# Patient Record
Sex: Female | Born: 1935
Health system: Southern US, Community
[De-identification: ages and names within clinical notes are randomized; demographics above are authoritative.]

## PROBLEM LIST (undated history)

## (undated) DIAGNOSIS — E559 Vitamin D deficiency, unspecified: Secondary | ICD-10-CM

## (undated) DIAGNOSIS — K589 Irritable bowel syndrome without diarrhea: Secondary | ICD-10-CM

## (undated) DIAGNOSIS — I4891 Unspecified atrial fibrillation: Secondary | ICD-10-CM

## (undated) DIAGNOSIS — J9611 Chronic respiratory failure with hypoxia: Secondary | ICD-10-CM

## (undated) DIAGNOSIS — H442B9 Degenerative myopia with macular hole, unspecified eye: Secondary | ICD-10-CM

## (undated) DIAGNOSIS — K635 Polyp of colon: Secondary | ICD-10-CM

## (undated) DIAGNOSIS — N189 Chronic kidney disease, unspecified: Secondary | ICD-10-CM

## (undated) DIAGNOSIS — E039 Hypothyroidism, unspecified: Secondary | ICD-10-CM

## (undated) DIAGNOSIS — J449 Chronic obstructive pulmonary disease, unspecified: Secondary | ICD-10-CM

## (undated) DIAGNOSIS — I214 Non-ST elevation (NSTEMI) myocardial infarction: Secondary | ICD-10-CM

## (undated) DIAGNOSIS — M810 Age-related osteoporosis without current pathological fracture: Secondary | ICD-10-CM

## (undated) DIAGNOSIS — I639 Cerebral infarction, unspecified: Secondary | ICD-10-CM

## (undated) DIAGNOSIS — F419 Anxiety disorder, unspecified: Secondary | ICD-10-CM

## (undated) DIAGNOSIS — I739 Peripheral vascular disease, unspecified: Secondary | ICD-10-CM

## (undated) DIAGNOSIS — M3500 Sicca syndrome, unspecified: Secondary | ICD-10-CM

## (undated) DIAGNOSIS — Z952 Presence of prosthetic heart valve: Secondary | ICD-10-CM

## (undated) DIAGNOSIS — I6521 Occlusion and stenosis of right carotid artery: Secondary | ICD-10-CM

## (undated) DIAGNOSIS — I2510 Atherosclerotic heart disease of native coronary artery without angina pectoris: Secondary | ICD-10-CM

## (undated) DIAGNOSIS — K469 Unspecified abdominal hernia without obstruction or gangrene: Secondary | ICD-10-CM

## (undated) DIAGNOSIS — E785 Hyperlipidemia, unspecified: Secondary | ICD-10-CM

## (undated) DIAGNOSIS — E119 Type 2 diabetes mellitus without complications: Secondary | ICD-10-CM

## (undated) HISTORY — DX: Sjogren syndrome, unspecified: M35.00

## (undated) HISTORY — DX: Anxiety disorder, unspecified: F41.9

## (undated) HISTORY — DX: Hyperlipidemia, unspecified: E78.5

## (undated) HISTORY — DX: Polyp of colon: K63.5

## (undated) HISTORY — DX: Atherosclerotic heart disease of native coronary artery without angina pectoris: I25.10

## (undated) HISTORY — PX: CAROTID ENDARTERECTOMY: SUR193

## (undated) HISTORY — DX: Hypothyroidism, unspecified: E03.9

## (undated) HISTORY — DX: Vitamin D deficiency, unspecified: E55.9

## (undated) HISTORY — DX: Peripheral vascular disease, unspecified: I73.9

## (undated) HISTORY — DX: Cerebral infarction, unspecified: I63.9

## (undated) HISTORY — DX: Occlusion and stenosis of right carotid artery: I65.21

## (undated) HISTORY — DX: Presence of prosthetic heart valve: Z95.2

## (undated) HISTORY — DX: Unspecified abdominal hernia without obstruction or gangrene: K46.9

## (undated) HISTORY — PX: ABDOMINAL HERNIA REPAIR: SHX539

## (undated) HISTORY — PX: AORTIC VALVE REPLACEMENT: SHX41

## (undated) HISTORY — DX: Age-related osteoporosis without current pathological fracture: M81.0

## (undated) HISTORY — DX: Type 2 diabetes mellitus without complications: E11.9

## (undated) HISTORY — DX: Non-ST elevation (NSTEMI) myocardial infarction: I21.4

## (undated) HISTORY — DX: Unspecified atrial fibrillation: I48.91

## (undated) HISTORY — PX: CARDIAC CATHETERIZATION: SHX172

## (undated) HISTORY — DX: Degenerative myopia with macular hole, unspecified eye: H44.2B9

## (undated) HISTORY — DX: Irritable bowel syndrome, unspecified: K58.9

## (undated) HISTORY — DX: Chronic obstructive pulmonary disease, unspecified: J44.9

## (undated) HISTORY — PX: COLONOSCOPY: SHX174

## (undated) HISTORY — DX: Chronic kidney disease, unspecified: N18.9

## (undated) HISTORY — DX: Chronic respiratory failure with hypoxia: J96.11

---

## 2011-03-06 HISTORY — PX: MASTECTOMY: SHX3

## 2012-05-13 ENCOUNTER — Inpatient Hospital Stay
Admission: RE | Admit: 2012-05-13 | Discharge: 2012-06-09 | Disposition: A | Discharge status: Home / Self Care | Payer: Medicare Other | Source: Ambulatory Visit | Attending: Internal Medicine | Admitting: Internal Medicine

## 2012-05-13 DIAGNOSIS — J449 Chronic obstructive pulmonary disease, unspecified: Principal | ICD-10-CM

## 2012-05-13 DIAGNOSIS — R0602 Shortness of breath: Secondary | ICD-10-CM

## 2012-05-21 ENCOUNTER — Ambulatory Visit (HOSPITAL_COMMUNITY)
Admit: 2012-05-21 | Discharge: 2012-05-21 | Disposition: A | Discharge status: Home / Self Care | Payer: Medicare Other | Attending: Internal Medicine | Admitting: Internal Medicine

## 2012-05-21 DIAGNOSIS — J90 Pleural effusion, not elsewhere classified: Secondary | ICD-10-CM | POA: Insufficient documentation

## 2012-05-21 DIAGNOSIS — J4489 Other specified chronic obstructive pulmonary disease: Secondary | ICD-10-CM | POA: Insufficient documentation

## 2012-05-21 DIAGNOSIS — J449 Chronic obstructive pulmonary disease, unspecified: Secondary | ICD-10-CM | POA: Insufficient documentation

## 2012-05-21 DIAGNOSIS — R0602 Shortness of breath: Secondary | ICD-10-CM | POA: Insufficient documentation

## 2012-05-23 ENCOUNTER — Ambulatory Visit (HOSPITAL_COMMUNITY)
Admit: 2012-05-23 | Discharge: 2012-05-23 | Disposition: A | Discharge status: Skilled Nursing Facility | Payer: Medicare Other | Source: Skilled Nursing Facility | Attending: Internal Medicine | Admitting: Internal Medicine

## 2012-05-23 DIAGNOSIS — J90 Pleural effusion, not elsewhere classified: Secondary | ICD-10-CM | POA: Insufficient documentation

## 2012-05-23 DIAGNOSIS — R0602 Shortness of breath: Secondary | ICD-10-CM | POA: Insufficient documentation

## 2012-05-23 DIAGNOSIS — Z9889 Other specified postprocedural states: Secondary | ICD-10-CM | POA: Insufficient documentation

## 2012-05-23 MED ORDER — IOHEXOL 350 MG/ML SOLN
100.0000 mL | Freq: Once | INTRAVENOUS | Status: AC | PRN
  Administered 2012-05-23: 100 mL via INTRAVENOUS

## 2012-05-24 ENCOUNTER — Non-Acute Institutional Stay (SKILLED_NURSING_FACILITY): Payer: Medicare Other | Admitting: Internal Medicine

## 2012-05-24 DIAGNOSIS — J441 Chronic obstructive pulmonary disease with (acute) exacerbation: Secondary | ICD-10-CM

## 2012-05-24 DIAGNOSIS — I4891 Unspecified atrial fibrillation: Secondary | ICD-10-CM

## 2012-05-24 DIAGNOSIS — M25512 Pain in left shoulder: Secondary | ICD-10-CM

## 2012-05-24 DIAGNOSIS — I97190 Other postprocedural cardiac functional disturbances following cardiac surgery: Secondary | ICD-10-CM

## 2012-05-24 DIAGNOSIS — I97130 Postprocedural heart failure following cardiac surgery: Secondary | ICD-10-CM

## 2012-05-24 DIAGNOSIS — I359 Nonrheumatic aortic valve disorder, unspecified: Secondary | ICD-10-CM

## 2012-05-24 DIAGNOSIS — I25810 Atherosclerosis of coronary artery bypass graft(s) without angina pectoris: Secondary | ICD-10-CM

## 2012-05-24 DIAGNOSIS — M25519 Pain in unspecified shoulder: Secondary | ICD-10-CM

## 2012-05-24 NOTE — Progress Notes (Signed)
Subjective:     Patient ID: Julie Burns, female   DOB: Nov 28, 1930, 79 y.o.   MRN: [id number]  HPI Patient was seen today in followup from my admission H&P of three days ago. This is a 79 year old woman who has a history of coronary artery disease s/p stenting. On this occasion  she was admitted fairly urgently for aortic valve replacement which was done at [facility]. There were issues with regards to tapering her oxygen in acute care. When I reviewed her three days ago she was hypoxic with minimal exertion. After extensive discussion with the patient and her daughter I am left with the likely probability that the patient has significant underlying COPD which I don't think has been previously diagnosed. She had seen a pulmonologist preop, however the severity of her aortic bowel disease and necessitated emergency open repair. Her anatomy was not amenable to a TAVR.  Other issues include A. fib on Coumadin. If not been able to get her adequately anticoagulated with Coumadin therefore she continues on Lovenox. I suspect this was a drug interaction with ciprofloxacin which  caused reduction of the INR after ciprofloxacin was stopped. The major concern however was her extreme hypoxemia. When I evaluated her three days ago I wasn't sure what how much of this was CHF, versus COPD, versus the underlying possibility of interstitial lung disease secondary to a history of both organic and inorganic dust exposure in the past. A CT scan of the chest showed the evidence of pulmonary hypertension, hazziness of the lung parenchyma suggested CHF. No overt interstitial lung changes.  There was a small right-sided pleural effusion or there was a small ascending thoracic aortic aneurysm at 3.6. I have increased her Lasix, put her on regular duo Neb nebulizer. Fortunately she seems to feel a lot better and her hypoxemia has stabilized.  Review of Systems Respiratory: Patient does not complaining of cough,  pleuritic chest pain, or really of exertional shortness of breath Cardiac: The patient denies palpitations or shortness of breath. There still may be mild orthopnea Abdominal; she is not complaining of nausea vomiting abdominal pain or diarrhea. Extremity: There is no claudication     Objective:   Physical Exam Pulse is 60 with occasional PVCs, respiratory rate 18 and not in distress, pulse oximeter 95% on room air blood pressure 177/60 weight has been stable at 211 pounds. Respiratory; shallow but otherwise without any distress. No wheezing. Her work of breathing appears normal, no accessory muscle use. Possibly a few crackles in the right lower lobe Cardiac; sounds are normal. Soft midsystolic ejection murmur sounds benign. No S4 no S3, JVP is not elevated. GI; no tenderness no masses Extremities; no edema, no signs of a DVT     Assessment:   #1) CHF; this is my her BNP was 1240. Her Lasix has been increased to 60 mg a day from 40 mg a day. I will  increase this to 80. #2) COPD acute; this is responded clinically to routine duo nebs. I suspect this woman actually has significant COPD which according to discussion with her and family was probably under recognized prior to this acute event. When she is stabilized I will change her to a combined steroid, laba. She will eventually need PFTs. However that is not necessary now. #3] A. Fib. Rate is controlled. She is on top her low XL 50 mg daily. She is also on amiodarone 200 mg a day. A little I had some concern about amiodarone induced lung  toxicity I doubt this is the issue now. #4] coagulation issues. The patient will continue on Lovenox and Coumadin until the INR can reach a therapeutic range. We'll check a CBC in two days time. #5) Hypertension: THis is not under control .Increase lisinopril    Plan:

## 2012-05-27 ENCOUNTER — Non-Acute Institutional Stay (SKILLED_NURSING_FACILITY): Payer: Medicare Other | Admitting: Internal Medicine

## 2012-05-27 ENCOUNTER — Encounter: Payer: Self-pay | Admitting: Internal Medicine

## 2012-05-27 DIAGNOSIS — J441 Chronic obstructive pulmonary disease with (acute) exacerbation: Secondary | ICD-10-CM | POA: Insufficient documentation

## 2012-05-27 DIAGNOSIS — I25810 Atherosclerosis of coronary artery bypass graft(s) without angina pectoris: Secondary | ICD-10-CM | POA: Insufficient documentation

## 2012-05-27 DIAGNOSIS — M25512 Pain in left shoulder: Secondary | ICD-10-CM

## 2012-05-27 DIAGNOSIS — M25519 Pain in unspecified shoulder: Secondary | ICD-10-CM

## 2012-05-27 DIAGNOSIS — I359 Nonrheumatic aortic valve disorder, unspecified: Secondary | ICD-10-CM | POA: Insufficient documentation

## 2012-05-27 DIAGNOSIS — I4891 Unspecified atrial fibrillation: Secondary | ICD-10-CM

## 2012-05-27 NOTE — Progress Notes (Signed)
Note entered in error

## 2012-05-28 ENCOUNTER — Non-Acute Institutional Stay (SKILLED_NURSING_FACILITY): Payer: Medicare Other | Admitting: Internal Medicine

## 2012-05-28 ENCOUNTER — Ambulatory Visit (HOSPITAL_COMMUNITY)
Admit: 2012-05-28 | Discharge: 2012-05-28 | Disposition: A | Discharge status: Home / Self Care | Payer: Medicare Other | Attending: Internal Medicine | Admitting: Internal Medicine

## 2012-05-28 DIAGNOSIS — I4891 Unspecified atrial fibrillation: Secondary | ICD-10-CM

## 2012-05-28 DIAGNOSIS — J441 Chronic obstructive pulmonary disease with (acute) exacerbation: Secondary | ICD-10-CM

## 2012-05-28 DIAGNOSIS — R110 Nausea: Secondary | ICD-10-CM

## 2012-05-28 DIAGNOSIS — I509 Heart failure, unspecified: Secondary | ICD-10-CM

## 2012-05-28 LAB — BLOOD GAS, ARTERIAL
Bicarbonate: 30.3 mEq/L — ABNORMAL HIGH (ref 20.0–24.0)
O2 Content: 4 L/min
Patient temperature: 37
pH, Arterial: 7.454 — ABNORMAL HIGH (ref 7.350–7.450)
pO2, Arterial: 58.8 mmHg — ABNORMAL LOW (ref 80.0–100.0)

## 2012-05-28 NOTE — Progress Notes (Signed)
Patient ID: Julie Burns, female   DOB: February 12, 1931, 79 y.o.   MRN: [id number] Chief complaint; review of medical issues including congestive heart failure, hypoxemia, likely chronic lung disease  History [name] is a lady came to [place] after an aortic valve replacement.  Her postoperative course was complicated by atrial fibrillation necessitating the use of amiodarone.  By the time of her discharge summary { a surgery done at [facility]) there were problems weaning her oxygen.  When I finally got to do her history and physical on 05/24/2012 she was hypoxic even with minimal exertion.  With use of routine nebulizers and a higher flow of oxygen she seems to come a lot better.  Nevertheless she continues to have exertional hypoxemia which I think is limiting her physical therapy.  Suspect by reviewing her history that she had underlying chronic lung disease even before this issue although I don't think COPD was part of her premorbid diagnoses.  It increased her Lasix to 80 mg and she appears to be tolerating this well, had the CHF radiographically.  As well her BNP was over 1200.  As mentioned she seems to be doing very well with the routine nebulizers.  Review of systems; Respiratory; she really does not complain of shortness of breath all, no wheezing Cardiac no exertional chest pain no suggesting of postoperative angina GI; she complains of nausea without vomiting.  She attributes this to her pills in the morning.  Medications; low vaso-1 g 2 tablets by mouth twice a day, Prilosec 20 by mouth daily Klor-Con 20 mEq by mouth daily, amiodarone 200 mg by mouth daily, Provera 92 puffs 4 times a day, ASA 81 daily, calcium carbonate 600 by mouth twice a day, vitamin B complex one by mouth daily, vitamin D 3 1000 units by mouth daily, clonidine 0.2 by mouth twice a day, Tylenol XL 50 by mouth daily, Coumadin 8 mg by mouth daily, [INR tomorrow], Lasix 80 mg by mouth daily, lisinopril 10 mg by mouth daily, DuoNeb  nebs every 4 while awake  Examination; 97.8, p-59, 20,127/78 Respiratory; she generally has decreased air entry bilaterally however this is considerably better than on her arrival here.  There was no audible wheeze.  I walked her roughly 50 yards today.  Her pulse ox drops down to the 83-84% range rebound somewhat to 86-88%.  I suspect that her 50 yards is going to be her exercise tolerance.  [Vo2 MAx) at no point did she complain of exertional chest pain. Cardiac heart sounds are regular there is no evidence of heart failure.   GI no liver no spleen no tenderness GU no suprapubic or costovertebral angle tenderness  Impression/plan 1 COPD and some point I'm going to do a blood gas on her.  Really I think she needs pulmonary function tests however I don't feel pressed to do this at the moment, this may wait till after she is discharged.  I will change her to a steroid inhaler plus all capitals LABA #2 CHF this is stable I see no evidence of this.  She is stable completely from this regard and I see no reason to adjust her Lasix. #3 atrial fibrillation I am not sure the moment whether she is in atrial fibrillation or sinus rhythm her pulse is in the 60's to high 50's #4 nausea question medication induced; there are many possible culprits let alone the volume of medications.  I am going to reduce her amiodarone 200 mg a day as I  seen amiodarone give a failure to thrive syndrome in older people many times.  However I'm also going to try to simplify her overall medical regimen.   I suspect this patient really is going to have a 50 yard exercise tolerance.  I have little doubt that she will require long-term oxygen, was already on this but only at night.  She will require PFTs but I don't feel pressed to do this current.  Terms of her underlying aortic valve replacement I think she has done amazingly well .

## 2012-05-28 NOTE — Progress Notes (Signed)
Patient ID: Julie Burns, female   DOB: 08-07-1930, 79 y.o.   MRN: [id number] Roena Malady, PA-C at 05/27/2012  7:31 PM                         Chief complaint-acute visit followup coronary artery disease-with history of A. fib-and aortic valve replacement-occasional hypoxia.  History of results.  Patient is a pleasant elderly resident with the above diagnoses.  She continues to be quite fragile-her O2 saturations at one point they were in the high 80s however they have now come up to the low 90s.  She actually says her breathing is better-she appears to be more comfortable  than when I saw her on admission--she has been started on routine nebulizers and these appear to help-- does have a listed history of COPD  She is sitting on the side of the bed does not appear to be in any distress.  Her Lasix was increased yesterday to 80 mg a day-it appears she's lost about 3 pounds the past 24 hours.  Her vital signs remained stable  .  Review of systems.  General denies any fever or chills.  Respiratory-denies any increase shortness of breath from baseline says she is actually doing a bit better recently-did not complain of cough.  Cardiac-- history of coronary artery disease but denies any chest pain or palpitations.  Muscle skeletal-does complain at times of some left shoulder pain-denies any recent trauma or fall.  Neurologic does not complaining of any headache or dizziness  Physical exam.  In general this is a frail elderly female who does not appear to be in any distress sitting comfortably on the side of her bed  Skin is warm and dry.  Chest is clear to auscultation with shallow air entry no labored breathing rhonchi rales or wheezes.  Heart is regular rate and rhythm with occasional irregular beats that she has minimal lower extremity edema.  Muscle skeletal-moves all extremities at baseline-I did not note any deformity of the left shoulder area-she does have some pain  anterior to the shoulder when she lifts her arm--radial pulses intact.  Neurologic-grossly intact speech is clear no lateralizing findings.  Psych she is pleasant alert and oriented x3.   Labs.  05/26/2012.  WBC 9.7 hemoglobin 12.2 platelets 328.  Sodium 137 potassium 4.7 BUN 11 creatinine 0.7.  INR 2.76.  Assessment plan.  #1-hypoxia---hx copd--cad-this appears to be asymptomatic  in the high 80s and low 90s-currently 93%-she continues on chronic oxygen-and has nebulizer treatments every 4 hours---this appears to be relatively stable and baseline for her--continue to monitor closely she is a fragile individual--says her breathing actually is a bit better today.  #2-history coronary artery disease-at this point appears relatively stable-denies any chest pain.--She is on Lasix which was recently increased also on Toprol-and ACE inhibitor has been started as well-she does continue on aspirin also  #3-left shoulder pain-she has been working with therapy I suspect this is more of muscle soreness from exercise--if this persists or worsens certainly will consider more aggressive workup including an x-ray--she is receiving Ultram when necessary for pain   #4-atrial Fibrillation-this appears rate controlled-she is on Toprol as well as Coumadin-Lovenox has been DC'd since INR is now therapeutic as of yesterday e also continues  amiodarone

## 2012-05-28 NOTE — Progress Notes (Addendum)
Patient ID: Julie Burns, female   DOB: 08-Mar-1930, 79 y.o.   MRN: [id number]          HISTORY & PHYSICAL  DATE:  05/21/2012  FACILITY: [facility]  LEVEL OF CARE: SNF   CHIEF COMPLAINT:  Status post admission to [facility], 04/30/2012 through 05/13/2012.  Status post aortic valve replacement.    HISTORY OF PRESENT ILLNESS:  This is an 79 year-old woman who lives in [place].  She moved there within the last year.  Previously was at [facility], which I think is a [facility]-oriented medical community.  Her pulmonologist and cardiologist were there.    She had a history of of severe aortic stenosis with an EF of 60%.  She had severe pulmonary hypertension.  Her valve area was 0.9 sq cm.  Cardiac catheterization had demonstrated a LAD stent, moderate right coronary artery disease.  The patient had been having exertional shortness of breath, limiting her activity.   She had a tissue aortic valve replacement.    Issues postoperatively included continued hypoxemia.    She also developed atrial fibrillation and was started on amiodarone.  She was placed on Coumadin 2.5 mg.  However, at the time she was on ciprofloxacin.  Therefore, after the Cipro finished, her INR has become non-therapeutic.    She was discharged on 3 L of oxygen, however with a note that she was on 2 L of oxygen continuously and this is incorrect.  She will use this at night only.  PAST MEDICAL HISTORY :   Aortic valve stenosis, now status post tissue replacement.     Atrial fibrillation.  This is new.  She is on amiodarone which is also new.    Pulmonary hypertension, which is listed as severe.    COPD.  Previously on Combivent, now on Pro-Air.    Coronary artery disease.    Carotid stenosis.   Irritable bowel syndrome.   History of a non-Q wave MI.    Macular degeneration.    Esophageal reflux.   Hypertension.   Hyperlipidemia.   Sjogren's syndrome.    History of breast cancer.    Recurrent umbilical  hernia, status post surgery.    PAST SURGICAL HISTORY:  Left carotid endarterectomy in 2000.   Circumflex coronary artery stent.    Echocardiogram in 2005 and repeated in 2007 showing a normal ejection fraction.   SOCIAL HISTORY: HOUSING:  The patient lives in [place] in an apartment.  I am not sure if this is a seniors' apartment.  FUNCTIONAL STATUS:  She did not use an ambulatory assist device.  She was not on Coumadin.  Oxygen at night, which sounds as though she used on a very p.r.n. basis.  I am not exactly sure of her functional status completely, although her daughter said she was independent enough to go walking around stores.  She did have exertional shortness of breath.  Just how bad this is, I am uncertain.    FAMILY HISTORY:  SIBLINGS:  Widespread heart disease in siblings including a brother and sister.  Not exactly sure about the nature.  MOTHER:  Heart disease.  FATHER:  Heart disease.  CHILDREN:  She has a son with COPD.   CURRENT MEDICATIONS:  Medication list is reviewed.    Prilosec 20 PO q.d.   Lovaza 1 g, 2 PO b.i.d.  KCl 20 q.d.   Amiodarone 200 q.d.   Cipro 200 a day for seven days, which should have finished.  Lisinopril.   Coumadin, dose escalating, currently at 7 mg.   Pro-Air 9 mcg, 2 puffs q.i.d.  ASA 81 q.d.   Vitamin D3 1000 q.d.   Clonidine 0.25, 2 mg PO b.i.d.   Lasix 40 mg daily.  Toprol XL 50 q.d.   REVIEW OF SYSTEMS:   CHEST/RESPIRATORY:  She has exertional shortness of breath, some wheezing, but no cough.  Her daughter states that she was walking up and down the hallway at [facility], and she walked her here two days ago up and down the hallway with her oxygen on at 2 L.   CARDIAC:  She does not describe exertional chest pain.  Does have incisional chest pain.   GI:  Has an umbilical hernia but no nausea,  vomiting or abdominal pain.   PHYSICAL EXAMINATION: VITAL SIGNS:        PULSE:  60s-70s, sounds regular.          O2  SATURATIONS:  Pulse ox variable.  Initially 94% on 2 L.  With minimal activity, it drops down to 83%.  More worrisome than this, she really looks as though she is exhausted.        .     CHEST/RESPIRATORY: Air entry is reduced at the bases, almost negligible in the upper lobes.  There was no wheezing heard.   She is not clubbed.   CARDIOVASCULAR:  Heart sounds are distant.  There are no murmurs.  Her JVP is not elevated.  There is no accentuation of her second sound.  No S3 or S4 are appreciated.   CARDIAC:   GASTROINTESTINAL: ABDOMEN:  Somewhat distended.    HERNIA:  Umbilical hernia.  This is nontender, does not reduce.   CIRCULATION: EDEMA/VARICOSITIES:  Minimal edema.  No evidence of a DVT. ARTERIAL:  Peripheral pulses are palpable.  NEUROLOGICAL:   BALANCE/GAIT:  I got her in a standing position.  When she walked a short distance, her O2 sat dropped to 84%.  She really looked fatigued.  I am not exactly sure what is most operative here.    ASSESSMENT/PLAN:  Status post aortic valve replacement (            ).  She had oxygen issues at the hospital, as well.  I wonder if they were walking her but not measuring her O2 sats.  Certainly what I saw in the room today, going maybe five yards, did not look like somebody who was capable of walking up and down the hall.    Atrial fibrillation 427.31.   This seems well controlled.  Rate is controlled.  Pulsing normal sinus rhythm.   Question congestive heart failure 428.0.  I see no evidence of this at the bedside.  Nevertheless, I am going to do a chest x-ray, EKG, BNP.   Hypoxemia (            ).  Especially with exertion.  I am uncertain whether this is cardiac or she has severe underlying lung disease that was underestimated prior to her surgery.  She saw a pulmonologist in [place] [place], a Dr. Cheree Ditto, but he did not have a chance to do pulmonary function tests although I believe he wanted to.  I will rule out heart failure, she does not appear to have  COPD acute.  I would wonder about underlying chronic lung disease, interstitial lung disease as she has a history of both inorganic and organic dust exposure.  All of this could have been seriously under-recognized  prior to her emergent surgery.    Anxiety, apparently with depression (            ).   Minimal edema (            ).  Her weight has not really fluctuated all that much.  Nevertheless, I will increase her lasix slightly.    I spoke to a very concerned daughter.  Right now, I do not have a sense of this patient's underlying pulmonary status.   I know she has severe pulmonary hypertension, presumably due to valvular heart disease.  Whether there is something else operative here, I am uncertain.  She will need an increase in her oxygen.    CPT CODE: 86578.

## 2012-06-02 ENCOUNTER — Other Ambulatory Visit (HOSPITAL_COMMUNITY): Payer: Self-pay

## 2012-06-02 ENCOUNTER — Non-Acute Institutional Stay (SKILLED_NURSING_FACILITY): Payer: Medicare Other | Admitting: Internal Medicine

## 2012-06-02 DIAGNOSIS — J441 Chronic obstructive pulmonary disease with (acute) exacerbation: Secondary | ICD-10-CM

## 2012-06-02 DIAGNOSIS — I97190 Other postprocedural cardiac functional disturbances following cardiac surgery: Secondary | ICD-10-CM

## 2012-06-02 DIAGNOSIS — I97130 Postprocedural heart failure following cardiac surgery: Secondary | ICD-10-CM

## 2012-06-04 ENCOUNTER — Ambulatory Visit (HOSPITAL_COMMUNITY): Admit: 2012-06-04 | Payer: Medicare Other

## 2012-06-04 ENCOUNTER — Non-Acute Institutional Stay (SKILLED_NURSING_FACILITY): Payer: Medicare Other | Admitting: Internal Medicine

## 2012-06-04 DIAGNOSIS — I97130 Postprocedural heart failure following cardiac surgery: Secondary | ICD-10-CM

## 2012-06-04 DIAGNOSIS — I97190 Other postprocedural cardiac functional disturbances following cardiac surgery: Secondary | ICD-10-CM

## 2012-06-04 DIAGNOSIS — J441 Chronic obstructive pulmonary disease with (acute) exacerbation: Secondary | ICD-10-CM

## 2012-06-05 NOTE — Progress Notes (Shared)
Patient ID: Julie Burns, female   DOB: October 02, 1930, 79 y.o.   MRN: [id number]       PROGRESS NOTE  DATE:   05/26/2012  FACILITY:  [facility]   LEVEL OF CARE: SNF  Acute Visit  CHIEF COMPLAINT:   Follow up multiple medical issues including hypoxemia.    HISTORY OF PRESENT ILLNESS:  Julie Burns is an 79 year-old lady who came to [place] after an aortic valve replacement.  We have had her on Lovenox to Coumadin after her PT/INR plummeted after her acute care discharge, likely secondary to discontinuing ciprofloxacin.    Issues have included extreme hypoxemia with exertion.  I have increased her oxygen to 4 L and she seems to maintain her saturation from this.  The reason for the continued hypoxemia is likely to be severe underlying COPD which may have been under-recognized as an outpatient.    She had a CT scan of the chest that did not show significant interstitial lung disease or pulmonary embolism.  My plan would be to continue her on oxygen.  I suspect at some point she is going to need an arterial blood gas as well as pulmonary function tests (at least spirometry and flow-volume loops).  However, I have been delaying this for now.    REVIEW OF SYSTEMS: CHEST/RESPIRATORY:  She is not complaining of excessive shortness of breath.   CARDIAC:   Chest pain, but I think this is incisional chest pain.   GI:  She complains of nausea without diarrhea.   GU:  No dysuria.    PHYSICAL EXAMINATION:  GENERAL APPEARANCE:  The patient does not look unwell.   CHEST/RESPIRATORY:  Continues to suggest better air entry than when she arrived.  Her work of breathing does not appear to be excessive.  There is no accessory muscle use.  Pulse ox is 95% on room air.   CARDIOVASCULAR:  CARDIAC:   Incision looks fine.  Heart sounds are normal.  No signs of CHF.   EDEMA/VARICOSITIES:  No edema.   ASSESSMENT/PLAN:  Continued hypoxemia.  I suspect this is underlying COPD which has responded to the  nebulizers.  As mentioned, ABGs and spirometry or possibly full PFTs when her physical condition allows.    Status post aortic valve replacement.  There was an element of congestive heart failure here.  This appears to be better.  Her Lovenox can stop due to adequate Coumadin dosing.  I will reduce her Coumadin slightly to 8 mg PO q.d.  Check INR in three days' time.    CPT CODE: 40981.

## 2012-06-09 ENCOUNTER — Non-Acute Institutional Stay (SKILLED_NURSING_FACILITY): Payer: Medicare Other | Admitting: Internal Medicine

## 2012-06-09 DIAGNOSIS — I97130 Postprocedural heart failure following cardiac surgery: Secondary | ICD-10-CM

## 2012-06-09 DIAGNOSIS — I97190 Other postprocedural cardiac functional disturbances following cardiac surgery: Secondary | ICD-10-CM

## 2012-06-09 DIAGNOSIS — J441 Chronic obstructive pulmonary disease with (acute) exacerbation: Secondary | ICD-10-CM

## 2012-06-09 DIAGNOSIS — I4891 Unspecified atrial fibrillation: Secondary | ICD-10-CM

## 2012-06-09 NOTE — Progress Notes (Signed)
Patient ID: Julie Burns, female   DOB: June 11, 1930, 79 y.o.   MRN: [id number] Chief complaint; predischarge  History; this is an 79 year old lady who came to [place] initially from [facility]. She underwent a fairly urgent aortic valve replacement on 77/27/2014 with a tissue valve. There were issues with postoperative atrial fibrillation requiring IV amiodarone and ultimately Lovenox to Coumadin. On arrival to this facility in March she had issues with mild congestive heart failure, difficulties with anticoagulation, insinuate hypoxemia which I think is probably mostly unrecognized COPD issue. Most of these issues seem to have stabilized. She has done well from the heart failure point of view on Lasix 40 mg daily. Her weight has stabilized at 204.8 pounds. We have managed to get her adequately anticoagulated with an INR today of 2.48 on 6 mg of Coumadin.  The remaining issue has been continued hypoxemia. Although she saturates in the 93-95% on 4 L at rrest, she desaturates with activity. I think she has roughly a 77 your exercise tolerance. I tried to transition her to a long acting beta-agonist however she seems to do well better with nebulizer therefore I am sending her home on this. She also has a history of dust exposure although there is no clear evidence of interstitial lung disease clinically or on CT of her chest. I had wanted to do pulmonary function tests and pulmonology consultation however for one reason or another these are going to have to be left alone after she goeshome.   Review of systems; Respiratory exertional shortness of breath but no wheezing or shortness of breath. I am sending her out on 4 L of oxygen [see discussion above]. Cardiac no chest pain. She follows with her cardiac surgeon is doing well Extremities no PAD is noted  Physical exam/plan #1 status post aortic valve replacement with postoperative atrial fibrillation. This is stable rate is controlled in fact I think she is  in sinus rhythm. She is on Toprol XL and Coumadin. Whether or not She is going to need lifelong Coumadin due to the atrial fibrillation or not I am uncertain. #2 chronic hypoxemic respiratory failure on high flow oxygen . I suspect this is under recognized COPD. She was short of breath on exertion even before her valve surgery. PFTs and pulmonary consultation has been ordered. Home on 4 L of oxygen and nebulizers. #3 atrial fibrillation see discussion above  Patient seems to stable for discharge to home. [facility]OT skilled nursing.  Already has her own walker

## 2012-06-12 ENCOUNTER — Ambulatory Visit (HOSPITAL_COMMUNITY): Admit: 2012-06-12 | Payer: Medicare Other

## 2012-06-12 NOTE — Progress Notes (Shared)
Patient ID: Julie Burns, female   DOB: [birth year], 79 y.o.   MRN: [id number]          PROGRESS NOTE  DATE:   06/02/2012  FACILITY:  [facility]   LEVEL OF CARE:   SNF   Acute Visit   CHIEF COMPLAINT:  Follow up COPD, hypoxemia.     HISTORY OF PRESENT ILLNESS:  Julie Burns is a lady who came to [place] after urgent aortic valve surgery at [facility].   Postoperatively, she had difficulty with titrating her oxygen.  When she came here, she was frankly hypoxemic on oxygen.  We had to increase her to 4 L to get O2 sats consistently above 90% at rest.     She was also felt to be in some degree of CHF.  This was addressed with lasix and, at least at the bedside, I have been unable to detect any evidence of this.    She had a CT scan of her chest earlier this admission which showed a right-sided effusion, easy appearance of the pulmonary parenchyma suggestive of edema.  There was no pulmonary embolism and no interstitial lung changes.    Currently, she has had difficulties with exertional hypoxemia.  There is always a discord between the staff and what the daughter seems to observe.  However, I walked her from her room down to the nursing station last week.  This is perhaps a distance of probably 120 feet.  At that point, she was frankly exhausted.  However, I believe the daughter actually pushes her mother further than this.  Her O2 sats with exertion are in the mid 80s.  We went on to do a blood gas on her showing a pH of 7.454, pCO2 of 43.9, pO2 of 58.8, and a bicarb of 30.3.  This shows continued hypoxemia even at rest with minimal exertion.  Historically, I suspect this lady has underlying severe lung disease, likely obstructive.  She  apparently has been diagnosed with COPD in the past.  Was using a ProAir inhaler.     REVIEW OF SYSTEMS:   GENERAL:  She is not febrile.   CHEST/RESPIRATORY:   She does not complain of shortness of breath.  CARDIAC:   She gets a  choking feeling sometimes walking to the bathroom.  I do not get a sense of this.  She feels this is her breathing, although there are other issues here.    PHYSICAL EXAMINATION:   O2 SATURATIONS:  94% on 4 L.   GENERAL APPEARANCE:  The patient is not in any distress.   CHEST/RESPIRATORY:  Fairly decent air entry bilaterally.  There may be a few bibasilar crackles.   CARDIOVASCULAR:  CARDIAC:  2/6 systolic ejection murmur.  Jugular venous pressure is not elevated.   EDEMA/VARICOSITIES:  She has minimal to no edema.   GASTROINTESTINAL:   ABDOMEN:  Soft and nontender.    ASSESSMENT/PLAN:  Hypoxemia.  I think this is lung disease rather than anything to do with her heart.  I have asked for Dr. Juanetta Gosling' review of this and I have ordered PFTs which he can cancel at his discretion.      Congestive heart failure.  Status post aortic valve surgery.  She appears to be stable.  Saw her cardiologist last week.  I will recheck a BNP.    Anticoagulation issues.  Status post tissue aortic valve replacement.  Her INR actually got fairly high, over 4.  It is 2.06  now.  The dose was 8 mg.  I will increase her back to 6 mg.     CPT CODE: 16109.

## 2012-06-15 DIAGNOSIS — J441 Chronic obstructive pulmonary disease with (acute) exacerbation: Secondary | ICD-10-CM | POA: Insufficient documentation

## 2012-06-16 NOTE — Progress Notes (Signed)
Patient ID: Julie Burns, female   DOB: [birth year], 79 y.o.   MRN: [id number]          PROGRESS NOTE  DATE:   06/04/2012  FACILITY:  [facility]   LEVEL OF CARE:   SNF   Acute Visit   CHIEF COMPLAINT:  Pre-discharge review.    HISTORY OF PRESENT ILLNESS:  Julie Burns is a lady who came to [place] after aortic valve surgery with a tissue valve.  She is on Lovenox to Coumadin.  She had some degree of congestive heart failure.  We seemed to fix this with an increased dose of lasix.   She has atrial fibrillation.  Her heart rate is controlled and she is on amiodarone.    Her major problem while she has been here is hypoxemia.  She had a pO2 of 58 on 4 L.  This seems to correlate well with her O2 sats that generally run in the 88-90% range on 4 L.  Her pCO2 was  43.9, pH of 7.45, bicarbonate of 30.3.  The patient has never really complained much about shortness of breath or cough.  There was some suggestion historically that she had limitations due to shortness of breath on exertion.  Nevertheless, she has never been diagnosed with COPD, asthma or intrinsic lung disease although she does have a history of both organic and inorganic dust exposure.    REVIEW OF SYSTEMS:   CHEST/RESPIRATORY:  No cough.  No shortness of breath.   CARDIAC:   No chest pain.  No palpitations.   GI:  No nausea or vomiting.   MUSCULOSKELETAL:  Able to walk with her walker 90 feet, according to therapy (does better when she breathes through her mouth).     PHYSICAL EXAMINATION:   VITAL SIGNS:   O2 SATURATIONS:  88-90% on 4 L.   RESPIRATIONS:  20.   PULSE:  68.   CHEST/RESPIRATORY:  Fairly decent air entry bilaterally.  This is probably reduced.  However, there is no wheezing.  Her work of breathing does not appear to be excessive.  There is no accessory muscle use.   CARDIOVASCULAR:  CARDIAC:   Heart sounds are irregular, AFib I suspect.  There is no increase in jugular venous pressure.   EDEMA/VARICOSITIES:  No  coccyx edema and no pedal edema.    ASSESSMENT/PLAN:  Continued hypoxemia.  Unfortunately, her PFTs today were canceled.  I would have liked to have seen these.  I have set up an appointment with Dr. Juanetta Gosling later this month.  CT chest no PE and ILD.  CHF.  There is no evidence of this at the bedside.    Atrial fibrillation.  Status post aortic valve replacement.  She is on Coumadin.  Last INR was 3.62.  This is to be rechecked tomorrow.  Her BNP also is to be rechecked.    The patient wishes to go home.  At this point, I really do not see a good reason to avoid this issue, although there is some concern she lives in an apartment.  Her daughter will be with her.  She will need oxygen at 4 L.  She seems to do better with Duonebs q.i.d. routinely.  I tried to change her to an inhaled LABA.  However, did not do well with this.  Therefore, I am planning to send her home with nebulizers.  I will try to get the PFTs done before she leaves.     CPT CODE: 91478.

## 2012-06-23 ENCOUNTER — Ambulatory Visit (HOSPITAL_COMMUNITY): Admission: RE | Admit: 2012-06-23 | Payer: Medicare Other | Source: Ambulatory Visit | Admitting: Emergency Medicine

## 2012-06-23 ENCOUNTER — Encounter (HOSPITAL_COMMUNITY): Admission: RE | Payer: Self-pay | Source: Ambulatory Visit

## 2012-06-23 SURGERY — VIDEO BRONCHOSCOPY WITHOUT FLUORO
Anesthesia: Moderate Sedation | Laterality: Bilateral

## 2012-07-19 ENCOUNTER — Other Ambulatory Visit (HOSPITAL_BASED_OUTPATIENT_CLINIC_OR_DEPARTMENT_OTHER): Payer: Self-pay | Admitting: Internal Medicine

## 2012-07-23 ENCOUNTER — Other Ambulatory Visit (HOSPITAL_BASED_OUTPATIENT_CLINIC_OR_DEPARTMENT_OTHER): Payer: Self-pay | Admitting: Internal Medicine

## 2012-08-28 ENCOUNTER — Other Ambulatory Visit (HOSPITAL_BASED_OUTPATIENT_CLINIC_OR_DEPARTMENT_OTHER): Payer: Self-pay | Admitting: Internal Medicine

## 2014-03-19 ENCOUNTER — Ambulatory Visit: Payer: Medicare Other | Admitting: Occupational Therapy

## 2014-04-09 ENCOUNTER — Ambulatory Visit: Payer: Medicare Other | Admitting: Occupational Therapy

## 2016-10-09 ENCOUNTER — Ambulatory Visit: Payer: Medicare Other | Admitting: Cardiovascular Disease

## 2016-10-12 ENCOUNTER — Encounter: Payer: Self-pay | Admitting: *Deleted

## 2016-10-14 NOTE — Progress Notes (Signed)
Cardiology Office Note   Date:  10/15/2016   ID:  Julie Burns, Julie Burns 05-Jan-1931, MRN [id number]  PCP:  Joette Catching, MD  Cardiologist:   Charlton Haws, MD   No chief complaint on file.     History of Present Illness: Julie Burns is a 83 y.o. female who presents for consultation regarding CHF and atrial fibrillation Referred by Dr Juanetta Gosling All cardiology care done at [facility] Dr Noureddine mostly following in office History of AVR 2010  PAF on anticoagulation and amiodarone. CAD DES to RCA staged PCI LAD and circumflex 2015 not a CABG candidate. EF has been normal Notes indicate moderate MR in past PVD with Left CEA and PVD / claudication. Also varicose veins with previous ablative Rx EF has been normal  Appears that her last [facility] was done 09/05/15 Received atropine for post cardioversion pause   She has a caretaker. Lives in [place] Widowed/Divorced Has daughter in [place] one of 6 kids. Activity limited by Arthritis and COPD on oxygen 6 years comes to [place] to see Hawkins.   No chest pain, chronic dyspnea and LE edema from varicosities.   Past Medical History:  Diagnosis Date  . Age-related osteoporosis without current pathological fracture   . Anxiety disorder   . Atrial fibrillation (HCC)   . CAD (coronary artery disease)    12/2013-DES to RCA due to NSTEMI- staged PCI to LAD and CX (not ideal for CABG; Echocardiogram 01/16/10 EF 55%, severe LVH; Moderate AS; mild ot moderate TR  . Chronic kidney disease   . Chronic respiratory failure with hypoxia (HCC)   . COPD (chronic obstructive pulmonary disease) (HCC)   . Degenerative myopia with macular hole   . Diabetes (HCC)    TYPE 2  . Hyperlipidemia   . Hypothyroidism   . Irritable bowel syndrome without diarrhea   . NSTEMI (non-ST elevated myocardial infarction) (HCC)    NSTEMI 3/05; s/p PTCA with stenting of left circumflex coronary artery 3/05; totally occluded small vessel OM  . Occlusion and stenosis of right carotid  artery    Carotid Duplex 7/13 moderate disease (<70%) of the right internal carotid arteries, this is a high bifurcation, s/p left CEA without significant stenosis.  . Palpitations   . Polyp of colon   . Presence of prosthetic heart valve    S/P aortic valve replacement 04/29/2008   . PVD (peripheral vascular disease) (HCC)   . Sicca syndrome (HCC)   . Stroke (cerebrum) (HCC)   . Unspecified abdominal hernia without obstruction or gangrene   . Vitamin D deficiency     Past Surgical History:  Procedure Laterality Date  . AORTIC VALVE REPLACEMENT     S/P aortic valve replacement 04/29/2008   . CARDIAC CATHETERIZATION     by Flonnie Hailstone at [facility]; mLAD 30%, patent LCx stent, mRCA 50%, LVEDP 10, severe AS with AV mean gradient 46 mmHg, Right heart catheterization   . CAROTID ENDARTERECTOMY     Carotid Duplex 7/13 moderate disease (<70%) of the right internal carotid arteries, this is a high bifurcation, s/p left CEA without significant stenosis.     Current Outpatient Prescriptions  Medication Sig Dispense Refill  . alendronate (FOSAMAX) 70 MG tablet Take 70 mg by mouth once a week. Take with a full glass of water on an empty stomach.    [name] amiodarone (PACERONE) 200 MG tablet Take 200 mg by mouth daily.    [name] apixaban (ELIQUIS) 5 MG TABS tablet Take 5 mg  by mouth 2 (two) times daily.    . benzonatate (TESSALON) 200 MG capsule Take 200 mg by mouth 3 (three) times daily as needed for cough.    . budesonide (PULMICORT) 0.5 MG/2ML nebulizer solution Take 0.5 mg by nebulization 2 (two) times daily.    . clopidogrel (PLAVIX) 75 MG tablet Take 75 mg by mouth daily.    [name] doxazosin (CARDURA) 2 MG tablet Take 2 mg by mouth daily.    . fluticasone (FLONASE) 50 MCG/ACT nasal spray Place 2 sprays into both nostrils daily.    . furosemide (LASIX) 40 MG tablet Take 40 mg by mouth daily.    [name] gabapentin (NEURONTIN) 100 MG capsule Take 100 mg by mouth 4 (four) times daily.    . Ipratropium-Albuterol  (COMBIVENT IN) Inhale into the lungs.    [name] ipratropium-albuterol (DUONEB) 0.5-2.5 (3) MG/3ML SOLN Take 3 mLs by nebulization every 6 (six) hours as needed.    [name] levothyroxine (SYNTHROID, LEVOTHROID) 75 MCG tablet Take 75 mcg by mouth daily before breakfast.    . lisinopril (PRINIVIL,ZESTRIL) 20 MG tablet Take 20 mg by mouth daily.    [name] LORazepam (ATIVAN) 0.5 MG tablet Take 0.5 mg by mouth 2 (two) times daily.    . Melatonin-Pyridoxine (MELATONIN TR WITH VITAMIN B6) 3-10 MG TBCR Take 1 capsule by mouth at bedtime.    . metoprolol succinate (TOPROL-XL) 100 MG 24 hr tablet Take 100 mg by mouth daily. Take with or immediately following a meal.    . nitroGLYCERIN (NITROSTAT) 0.4 MG SL tablet Place 0.4 mg under the tongue every 5 (five) minutes x 3 doses as needed for chest pain.    [name] nystatin-triamcinolone ointment (MYCOLOG) Apply 1 application topically 2 (two) times daily.    . Omega-3 Fatty Acids (FISH OIL) 1000 MG CAPS Take 1 capsule by mouth daily.    . polyethylene glycol (MIRALAX / GLYCOLAX) packet Take 17 g by mouth daily.    . ranitidine (ZANTAC) 150 MG tablet Take 150 mg by mouth 2 (two) times daily.    . traMADol (ULTRAM) 50 MG tablet Take 50 mg by mouth every 6 (six) hours as needed.     No current facility-administered medications for this visit.     Allergies:   Bee venom; Codeine; Morphine and related; Norvasc [amlodipine besylate]; Penicillins; Sulfa antibiotics; Valium [diazepam]; and Zetia [ezetimibe]    Social History:  The patient  reports that she quit smoking about 40 years ago. Her smoking use included Cigarettes. She smoked 3.00 packs per day. She has never used smokeless tobacco. She reports that she does not drink alcohol or use drugs.   Family History:  The patient's family history includes CAD in her brother, father, mother, and sister; COPD in her son; Diabetes in her brother and brother; Hypertension in her brother, brother, father, mother, and sister.    ROS:   Please see the history of present illness.   Otherwise, review of systems are positive for none.   All other systems are reviewed and negative.    PHYSICAL EXAM: VS:  BP 140/70 (BP Location: Right Arm)   Pulse 68   Ht 5' 6.5" (1.689 m)   Wt 217 lb (98.4 kg)   SpO2 90%   BMI 34.50 kg/m  , BMI Body mass index is 34.5 kg/m. Affect appropriate Chronically ill female HEENT: normal Neck supple with no adenopathy JVP normal no bruits no thyromegaly Lungs clear with no wheezing and good diaphragmatic motion Heart:  S1/S2  SEM murmur, no rub, gallop or click PMI normal Abdomen: benighn, BS positve, no tenderness, no AAA no bruit.  No HSM or HJR Distal pulses hard to palpate in feet Edema with stasis and varicose veins  Neuro non-focal Skin warm and dry No muscular weakness    EKG:  NSR rate 56 nonspecific ST changes    Recent Labs: No results found for requested labs within last 8760 hours.    Lipid Panel No results found for: CHOL, TRIG, HDL, CHOLHDL, VLDL, LDLCALC, LDLDIRECT    Wt Readings from Last 3 Encounters:  10/15/16 217 lb (98.4 kg)  05/28/12 209 lb (94.8 kg)      Other studies Reviewed: Additional studies/ records that were reviewed today include: Numerous notes [facility] including echo, ABI"s , cardiology office notes .    ASSESSMENT AND PLAN:  1.  CAD stable no angina given age will observe and Rx medically 2. Valve Disease: Tissue AVR 83 years old will try to get echo from last year no AR on exam SBE prophyhlaxis 3. PVD:  Palpable DP bilaterally no limiting claudication will request ABI's from January 4. Carotid Disease post CEA f/u duplex in a year  5. LE venous disease varicosities no DVT continue current lasix dose 6. PAF  In NSR today continue beta blocker and and amiodarone PFT;s / DLCO with Dr Juanetta GoslingHawkins consider stopping Next f/u visit given chronic lung disease Continue Eliquis    Current medicines are reviewed at length with the patient today.  The  patient does not have concerns regarding medicines.  The following changes have been made:  no change  Labs/ tests ordered today include: None  Orders Placed This Encounter  Procedures  . EKG 12-Lead     Disposition:   FU with Dr Antoine PocheHochrein in [place]      Signed, Charlton HawsPeter Iva Posten, MD  10/15/2016 10:51 AM    [facility] [address], [place], [place]  [address] Phone: [phone]; Fax: [phone]

## 2016-10-15 ENCOUNTER — Encounter: Payer: Self-pay | Admitting: Cardiovascular Disease

## 2016-10-15 ENCOUNTER — Ambulatory Visit (INDEPENDENT_AMBULATORY_CARE_PROVIDER_SITE_OTHER): Payer: Medicare Other | Admitting: Cardiovascular Disease

## 2016-10-15 VITALS — BP 140/70 | HR 68 | Ht 66.5 in | Wt 217.0 lb

## 2016-10-15 DIAGNOSIS — I25119 Atherosclerotic heart disease of native coronary artery with unspecified angina pectoris: Secondary | ICD-10-CM | POA: Diagnosis not present

## 2016-10-15 NOTE — Patient Instructions (Signed)

## 2017-01-30 ENCOUNTER — Encounter: Payer: Self-pay | Admitting: Cardiology

## 2017-01-30 ENCOUNTER — Encounter: Payer: Self-pay | Admitting: *Deleted

## 2017-01-30 NOTE — Progress Notes (Signed)
Cardiology Office Note   Date:  01/31/2017   ID:  Julie LunaMary H Toepfer, DOB [birth year], MRN [id number]  PCP:  Joette CatchingNyland, Leonard, MD  Cardiologist:   Rollene RotundaJames Kierra Jezewski, MD    Chief Complaint  Patient presents with  . Coronary Artery Disease  . Atrial Fibrillation      History of Present Illness: Julie Burns is a 83 y.o. female who presents for follow-up of CHF and atrial fibrillation.  She saw Dr.Nishan earlier this year.  [facility].  She had AVR in 2010 (21mm Magna valve)and PAF on anticoagulation and amiodarone.  Of note she had a cardioversion in 2017 with post cardioversion pause requiring atropine.  She also has CAD with DES to the RCA staged PCI to the LAD and circumflex in 2015.  She was thought not to be a CABG candidate.    This is her first appt with me.  She gets around slowly with a walker.  She denies any acute cardiovascular symptoms.  She does have chronic dyspnea and is on oxygen all the time.  She denies any acute shortness of breath, PND or orthopnea.  She does not notice any palpitations, presyncope or syncope.  She has had no weight gain or edema.  She tolerates her anticoagulation.   Past Medical History:  Diagnosis Date  . Age-related osteoporosis without current pathological fracture   . Anxiety disorder   . Atrial fibrillation (HCC)   . CAD (coronary artery disease)    12/2013-DES to RCA due to NSTEMI- staged PCI to LAD and CX (not ideal for CABG; Echocardiogram 01/16/10 EF 55%, severe LVH; Moderate AS; mild ot moderate TR  . Chronic kidney disease   . Chronic respiratory failure with hypoxia (HCC)   . COPD (chronic obstructive pulmonary disease) (HCC)   . Degenerative myopia with macular hole   . Diabetes (HCC)    TYPE 2  . Hyperlipidemia   . Hypothyroidism   . Irritable bowel syndrome without diarrhea   . NSTEMI (non-ST elevated myocardial infarction) (HCC)    NSTEMI 3/05; s/p PTCA with stenting of left circumflex coronary artery  3/05; totally occluded small vessel OM  . Occlusion and stenosis of right carotid artery    Carotid Duplex 7/13 moderate disease (<70%) of the right internal carotid arteries, this is a high bifurcation, s/p left CEA without significant stenosis.  . Polyp of colon   . Presence of prosthetic heart valve    S/P aortic valve replacement 04/29/2008   . PVD (peripheral vascular disease) (HCC)   . Sicca syndrome (HCC)   . Stroke (cerebrum) (HCC)   . Unspecified abdominal hernia without obstruction or gangrene   . Vitamin D deficiency     Past Surgical History:  Procedure Laterality Date  . AORTIC VALVE REPLACEMENT     S/P aortic valve replacement 04/29/2008   . CARDIAC CATHETERIZATION     by Flonnie [facility]Saeed Payvar [facility]; mLAD 30%, patent LCx stent, mRCA 50%, LVEDP 10, severe AS with AV mean gradient 46 mmHg, Right heart catheterization   . CAROTID ENDARTERECTOMY     Carotid Duplex 7/13 moderate disease (<70%) of the right internal carotid arteries, this is a high bifurcation, s/p left CEA without significant stenosis.     Current Outpatient Medications  Medication Sig Dispense Refill  . alendronate (FOSAMAX) 70 MG tablet Take 70 mg by mouth once a week. Take with a full glass of water on an empty stomach.    [name]. amiodarone (  PACERONE) 200 MG tablet Take 200 mg by mouth daily.    [name] apixaban (ELIQUIS) 5 MG TABS tablet Take 5 mg by mouth 2 (two) times daily.    . benzonatate (TESSALON) 200 MG capsule Take 200 mg by mouth 3 (three) times daily as needed for cough.    . budesonide (PULMICORT) 0.5 MG/2ML nebulizer solution Take 0.5 mg by nebulization 2 (two) times daily.    [name] doxazosin (CARDURA) 2 MG tablet Take 2 mg by mouth daily.    . fluticasone (FLONASE) 50 MCG/ACT nasal spray Place 2 sprays into both nostrils daily.    . furosemide (LASIX) 40 MG tablet Take 40 mg by mouth daily.    [name] gabapentin (NEURONTIN) 100 MG capsule Take 100 mg by mouth 4 (four) times daily.    . Ipratropium-Albuterol  (COMBIVENT IN) Inhale into the lungs.    [name] ipratropium-albuterol (DUONEB) 0.5-2.5 (3) MG/3ML SOLN Take 3 mLs by nebulization every 6 (six) hours as needed.    [name] levothyroxine (SYNTHROID, LEVOTHROID) 75 MCG tablet Take 75 mcg by mouth daily before breakfast.    . lisinopril (PRINIVIL,ZESTRIL) 20 MG tablet Take 20 mg by mouth daily.    [name] LORazepam (ATIVAN) 0.5 MG tablet Take 0.5 mg by mouth 2 (two) times daily.    . Melatonin-Pyridoxine (MELATONIN TR WITH VITAMIN B6) 3-10 MG TBCR Take 1 capsule by mouth at bedtime.    . metoprolol succinate (TOPROL-XL) 100 MG 24 hr tablet Take 100 mg by mouth daily. Take with or immediately following a meal.    . nitroGLYCERIN (NITROSTAT) 0.4 MG SL tablet Place 0.4 mg under the tongue every 5 (five) minutes x 3 doses as needed for chest pain.    [name] nystatin-triamcinolone ointment (MYCOLOG) Apply 1 application topically 2 (two) times daily.    . Omega-3 Fatty Acids (FISH OIL) 1000 MG CAPS Take 1 capsule by mouth daily.    . polyethylene glycol (MIRALAX / GLYCOLAX) packet Take 17 g by mouth daily.    . ranitidine (ZANTAC) 150 MG tablet Take 150 mg by mouth 2 (two) times daily.    . traMADol (ULTRAM) 50 MG tablet Take 50 mg by mouth every 6 (six) hours as needed.     No current facility-administered medications for this visit.     Allergies:   Bee venom; Codeine; Morphine and related; Norvasc [amlodipine besylate]; Penicillins; Sulfa antibiotics; Valium [diazepam]; and Zetia [ezetimibe] 1  ROS:  Please see the history of present illness.   Otherwise, review of systems are positive for decreased vision, joint pain.   All other systems are reviewed and negative.    PHYSICAL EXAM: VS:  BP (!) 151/68   Pulse (!) 59   Ht 5\' 6"  (1.676 m)   Wt 213 lb (96.6 kg)   SpO2 96%   BMI 34.38 kg/m  , BMI Body mass index is 34.38 kg/m. GENERAL:  Frail appearing HEENT:  Pupils equal round and reactive, fundi not visualized, oral mucosa unremarkable NECK:  No jugular venous  distention, waveform within normal limits, carotid upstroke brisk and symmetric, bilateral bruits, no thyromegaly LYMPHATICS:  No cervical, inguinal adenopathy LUNGS:  Decreased breath sounds without wheezing or crackles BACK:  No CVA tenderness CHEST:  Unremarkable HEART:  PMI not displaced or sustained,S1 and S2 within normal limits, no S3, no S4, no clicks, no rubs, no murmurs ABD:  Flat, positive bowel sounds normal in frequency in pitch, no bruits, no rebound, no guarding, no midline pulsatile mass, no hepatomegaly, no  splenomegaly EXT:  2 plus pulses throughout, no edema, no cyanosis no clubbing SKIN:  No rashes no nodules NEURO:  Cranial nerves II through XII grossly intact, motor grossly intact throughout PSYCH:  Cognitively intact, oriented to person place and time    EKG:  EKG is ordered today. The ekg ordered today demonstrates sinus rhythm, rate 64, axis within normal limits, intervals within normal limits, no acute ST-T wave changes.   Recent Labs: No results found for requested labs within last 8760 hours.    Lipid Panel No results found for: CHOL, TRIG, HDL, CHOLHDL, VLDL, LDLCALC, LDLDIRECT    Wt Readings from Last 3 Encounters:  01/31/17 213 lb (96.6 kg)  10/15/16 217 lb (98.4 kg)  05/28/12 209 lb (94.8 kg)      Other studies Reviewed: Additional studies/ records that were reviewed today include: [facility] records. Review of the above records demonstrates:  Please see elsewhere in the note.     ASSESSMENT AND PLAN:    ATRIAL FIB: She seems to be maintaining sinus rhythm.  She is up-to-date with amiodarone blood work and I did review labs from Dr. Lysbeth GalasNyland.  TSH and liver enzymes were normal.  I think she should remain on the blood thinner Eliquis as well as amiodarone.  CAD:  The patient has no new sypmtoms.  No further cardiovascular testing is indicated.  Of note I extensively reviewed the records and at this point I do not think there is continued clear-cut  indication for the Plavix in addition to the Eliquis as I think her bleeding risk given her frailty and age is substantial.  Therefore, she will stop the Plavix.  AVR: She has had stable valve replacement.  No change in therapy is indicated.  CEA:  Status post left CEA.  She had moderate right stenosis.  She needs follow up Doppler.  I will schedule this   Current medicines are reviewed at length with the patient today.  The patient does not have concerns regarding medicines.  The following changes have been made:  no change  Labs/ tests ordered today include: None No orders of the defined types were placed in this encounter.    Disposition:   FU with me in six months.     Signed, Rollene RotundaJames Yenni Carra, MD  01/31/2017 1:36 PM    [facility]

## 2017-01-31 ENCOUNTER — Encounter: Payer: Self-pay | Admitting: Cardiology

## 2017-01-31 ENCOUNTER — Ambulatory Visit (INDEPENDENT_AMBULATORY_CARE_PROVIDER_SITE_OTHER): Payer: Medicare Other | Admitting: Cardiology

## 2017-01-31 ENCOUNTER — Other Ambulatory Visit: Payer: Self-pay

## 2017-01-31 VITALS — BP 151/68 | HR 59 | Ht 66.0 in | Wt 213.0 lb

## 2017-01-31 DIAGNOSIS — I2510 Atherosclerotic heart disease of native coronary artery without angina pectoris: Secondary | ICD-10-CM | POA: Insufficient documentation

## 2017-01-31 DIAGNOSIS — I4819 Other persistent atrial fibrillation: Secondary | ICD-10-CM

## 2017-01-31 DIAGNOSIS — I481 Persistent atrial fibrillation: Secondary | ICD-10-CM

## 2017-01-31 DIAGNOSIS — I6529 Occlusion and stenosis of unspecified carotid artery: Secondary | ICD-10-CM | POA: Diagnosis not present

## 2017-01-31 NOTE — Patient Instructions (Addendum)
Medication Instructions:   Stop Plavix.  Continue all other medications.    Labwork: none  Testing/Procedures:  Your physician has requested that you have a carotid duplex. This test is an ultrasound of the carotid arteries in your neck. It looks at blood flow through these arteries that supply the brain with blood. Allow one hour for this exam. There are no restrictions or special instructions.  Office will contact with results via phone or letter.    Follow-Up: Your physician wants you to follow up in: 6 months.  You will receive a reminder letter in the mail one-two months in advance.  If you don't receive a letter, please call our office to schedule the follow up appointment.  [facility]  Any Other Special Instructions Will Be Listed Below (If Applicable).  If you need a refill on your cardiac medications before your next appointment, please call your pharmacy.

## 2017-02-06 ENCOUNTER — Ambulatory Visit (INDEPENDENT_AMBULATORY_CARE_PROVIDER_SITE_OTHER): Payer: Medicare Other

## 2017-02-06 DIAGNOSIS — I6529 Occlusion and stenosis of unspecified carotid artery: Secondary | ICD-10-CM

## 2017-02-06 LAB — VAS US CAROTID
LCCADDIAS: -13 cm/s
LCCADSYS: -84 cm/s
LCCAPSYS: 81 cm/s
LEFT ECA DIAS: 0 cm/s
LEFT VERTEBRAL DIAS: 15 cm/s
LICADDIAS: -19 cm/s
LICADSYS: -121 cm/s
LICAPSYS: 109 cm/s
Left CCA prox dias: 13 cm/s
Left ICA prox dias: 20 cm/s
RCCAPSYS: 88 cm/s
RIGHT ECA DIAS: 0 cm/s
RIGHT VERTEBRAL DIAS: -4 cm/s
Right CCA prox dias: 10 cm/s
Right cca dist sys: -64 cm/s

## 2017-02-14 ENCOUNTER — Telehealth: Payer: Self-pay | Admitting: *Deleted

## 2017-02-14 DIAGNOSIS — I6523 Occlusion and stenosis of bilateral carotid arteries: Secondary | ICD-10-CM

## 2017-02-14 NOTE — Telephone Encounter (Signed)
-----   Message from Rollene RotundaJames Hochrein, MD sent at 02/08/2017  1:40 AM EST ----- Right Carotid: There is evidence in the right ICA of a 40-59% stenosis. Left Carotid: There is evidence in the left ICA of a 1-39% stenosis. Patent left       endartectomy site. Repeat study in one year.   Call Ms. Neldon LabellaKallam with the results and send results to Joette CatchingNyland, Leonard, MD

## 2017-02-14 NOTE — Telephone Encounter (Signed)
Pt aware of her carotid doppler, order placed to pt to get done in 1 year.

## 2017-02-22 ENCOUNTER — Ambulatory Visit: Payer: Medicare Other | Admitting: Cardiology

## 2017-05-06 NOTE — Progress Notes (Signed)
Cardiology Office Note   Date:  05/08/2017   ID:  Julie Burns, DOB January 27, 1931, MRN [id number]  PCP:  Joette Catching, MD  Cardiologist:   Rollene Rotunda, MD    No chief complaint on file.     History of Present Illness: Julie Burns is a 84 y.o. female who presents for follow-up of CHF and atrial fibrillation.  She saw Dr.Nishan earlier this year.  She was previously followed at [facility].  She had AVR in 2010 (21mm Magna valve)and PAF on anticoagulation and amiodarone.  Of note she had a cardioversion in 2017 with post cardioversion pause requiring atropine.  She also has CAD with DES to the RCA staged PCI to the LAD and circumflex in 2015.  She was thought not to be a CABG candidate.    Since I saw her for the first time recently she has done OK.  She is on chronic oxygen and gets around with a walker.  She has good days and bad days where she feels weak and has bouts of nausea.  She does not he occasionally gets shoulder discomfort she is not sure whether it is angina so she does take a nitroglycerin.  However, she thinks is only used a couple of these since January.  She has chronic shortness of breath but not having any PND or orthopnea.  She is not describing any palpitations, presyncope or syncope.  She is had no weight gain or edema.   Past Medical History:  Diagnosis Date  . Age-related osteoporosis without current pathological fracture   . Anxiety disorder   . Atrial fibrillation (HCC)   . CAD (coronary artery disease)    12/2013-DES to RCA due to NSTEMI- staged PCI to LAD and CX (not ideal for CABG; Echocardiogram 01/16/10 EF 55%, severe LVH; Moderate AS; mild ot moderate TR  . Chronic kidney disease   . Chronic respiratory failure with hypoxia (HCC)   . COPD (chronic obstructive pulmonary disease) (HCC)   . Degenerative myopia with macular hole   . Diabetes (HCC)    TYPE 2  . Hyperlipidemia   . Hypothyroidism   . Irritable bowel syndrome without diarrhea   . NSTEMI  (non-ST elevated myocardial infarction) (HCC)    NSTEMI 3/05; s/p PTCA with stenting of left circumflex coronary artery 3/05; totally occluded small vessel OM  . Occlusion and stenosis of right carotid artery    Carotid Duplex 7/13 moderate disease (<70%) of the right internal carotid arteries, this is a high bifurcation, s/p left CEA without significant stenosis.  . Polyp of colon   . Presence of prosthetic heart valve    S/P aortic valve replacement 04/29/2008   . PVD (peripheral vascular disease) (HCC)   . Sicca syndrome (HCC)   . Stroke (cerebrum) (HCC)   . Unspecified abdominal hernia without obstruction or gangrene   . Vitamin D deficiency     Past Surgical History:  Procedure Laterality Date  . AORTIC VALVE REPLACEMENT     S/P aortic valve replacement 04/29/2008   . CARDIAC CATHETERIZATION     by Flonnie Hailstone at [facility]; mLAD 30%, patent LCx stent, mRCA 50%, LVEDP 10, severe AS with AV mean gradient 46 mmHg, Right heart catheterization   . CAROTID ENDARTERECTOMY     Carotid Duplex 7/13 moderate disease (<70%) of the right internal carotid arteries, this is a high bifurcation, s/p left CEA without significant stenosis.     Current Outpatient Medications  Medication Sig Dispense Refill  .  alendronate (FOSAMAX) 70 MG tablet Take 70 mg by mouth once a week. Take with a full glass of water on an empty stomach.    [name]. amiodarone (PACERONE) 200 MG tablet Take 200 mg by mouth daily.    [name]. apixaban (ELIQUIS) 5 MG TABS tablet Take 5 mg by mouth 2 (two) times daily.    . benzonatate (TESSALON) 200 MG capsule Take 200 mg by mouth 3 (three) times daily as needed for cough.    . budesonide (PULMICORT) 0.5 MG/2ML nebulizer solution Take 0.5 mg by nebulization 2 (two) times daily.    [name]. doxazosin (CARDURA) 2 MG tablet Take 2 mg by mouth daily.    . fluticasone (FLONASE) 50 MCG/ACT nasal spray Place 2 sprays into both nostrils daily.    . furosemide (LASIX) 40 MG tablet Take 40 mg by mouth daily.      [name]. gabapentin (NEURONTIN) 100 MG capsule Take 100 mg by mouth daily.     . Ipratropium-Albuterol (COMBIVENT IN) Inhale into the lungs.    [name]. ipratropium-albuterol (DUONEB) 0.5-2.5 (3) MG/3ML SOLN Take 3 mLs by nebulization every 6 (six) hours as needed.    [name]. levothyroxine (SYNTHROID, LEVOTHROID) 75 MCG tablet Take 75 mcg by mouth. Take one tablet daily by mouth except Mon. And Thurs. Take 1 1/2 tablets    . lisinopril (PRINIVIL,ZESTRIL) 20 MG tablet Take 20 mg by mouth daily.    [name]. LORazepam (ATIVAN) 0.5 MG tablet Take 0.5 mg by mouth 2 (two) times daily.    . Melatonin-Pyridoxine (MELATONIN TR WITH VITAMIN B6) 3-10 MG TBCR Take 1 capsule by mouth as needed.     . metoprolol succinate (TOPROL-XL) 100 MG 24 hr tablet Take 100 mg by mouth daily. Take with or immediately following a meal.    . nitroGLYCERIN (NITROSTAT) 0.4 MG SL tablet Place 0.4 mg under the tongue every 5 (five) minutes x 3 doses as needed for chest pain.    [name]. nystatin-triamcinolone ointment (MYCOLOG) Apply 1 application topically 2 (two) times daily.    . polyethylene glycol (MIRALAX / GLYCOLAX) packet Take 17 g by mouth daily.    . ranitidine (ZANTAC) 150 MG tablet Take 150 mg by mouth 2 (two) times daily.    . traMADol (ULTRAM) 50 MG tablet Take 50 mg by mouth every 6 (six) hours as needed.    . Omega-3 Fatty Acids (FISH OIL) 1000 MG CAPS Take 1 capsule by mouth daily.     No current facility-administered medications for this visit.     Allergies:   Bee venom; Codeine; Morphine and related; Norvasc [amlodipine besylate]; Penicillins; Sulfa antibiotics; Valium [diazepam]; and Zetia [ezetimibe] 1  ROS:  Please see the history of present illness.   Otherwise, review of systems are positive for decreased vision.   All other systems are reviewed and negative.    PHYSICAL EXAM: VS:  BP (!) 138/58   Pulse 60   Ht 5\' 6"  (1.676 m)   Wt 230 lb (104.3 kg)   BMI 37.12 kg/m  , BMI Body mass index is 37.12 kg/m.  GENERAL:  Well  appearing although slightly frail NECK:  No jugular venous distention, waveform within normal limits, carotid upstroke brisk and symmetric, bilateral carotid bruits, no thyromegaly LUNGS:  Clear to auscultation bilaterally CHEST:  Unremarkable HEART:  PMI not displaced or sustained,S1 and S2 within normal limits, no S3, no S4, no clicks, no rubs, 2 out of 6 apical systolic murmur radiating at the aortic outflow tract murmurs ABD:  Flat, positive bowel sounds normal in frequency in pitch, no bruits, no rebound, no guarding, no midline pulsatile mass, no hepatomegaly, no splenomegaly EXT:  2 plus pulses throughout, no edema, no cyanosis no clubbing   EKG:  EKG is ordered today. The ekg ordered today demonstrates sinus rhythm, rate 59, axis within normal limits, intervals within normal limits, no acute ST-T wave changes.   Recent Labs: No results found for requested labs within last 8760 hours.    Lipid Panel No results found for: CHOL, TRIG, HDL, CHOLHDL, VLDL, LDLCALC, LDLDIRECT    Wt Readings from Last 3 Encounters:  05/08/17 230 lb (104.3 kg)  01/31/17 213 lb (96.6 kg)  10/15/16 217 lb (98.4 kg)      Other studies Reviewed: Additional studies/ records that were reviewed today include: Labs Review of the above records demonstrates:     ASSESSMENT AND PLAN:    ATRIAL FIB:     She seems to be maintaining sinus rhythm.  She is doing well on the blood thinner and the amiodarone.  She is up-to-date with labs and I do see that there are follow-up TSH and liver enzymes scheduled for September.  She will continue on the meds as listed.    CAD: She seems to have some stable angina.  She will continue with the meds as listed.  I did stop the Plavix at the last visit.    AVR:    She has not had an echo in 2 years and I will repeat an echocardiogram.  CEA:  Status post left CEA.  She had moderate right stenosis. This was checked in Dec and she will have a repeat study in Dec of this  year.   Current medicines are reviewed at length with the patient today.  The patient does not have concerns regarding medicines.  The following changes have been made:  None  Labs/ tests ordered today include: **  Orders Placed This Encounter  Procedures  . EKG 12-Lead  . ECHOCARDIOGRAM COMPLETE     Disposition:   FU with me in 6 months.     Signed, Rollene Rotunda, MD  05/08/2017 12:59 PM    [facility]

## 2017-05-08 ENCOUNTER — Ambulatory Visit (INDEPENDENT_AMBULATORY_CARE_PROVIDER_SITE_OTHER): Payer: Medicare Other | Admitting: Cardiology

## 2017-05-08 ENCOUNTER — Encounter: Payer: Self-pay | Admitting: Cardiology

## 2017-05-08 ENCOUNTER — Telehealth: Payer: Self-pay | Admitting: Cardiology

## 2017-05-08 VITALS — BP 138/58 | HR 60 | Ht 66.0 in | Wt 230.0 lb

## 2017-05-08 DIAGNOSIS — I25118 Atherosclerotic heart disease of native coronary artery with other forms of angina pectoris: Secondary | ICD-10-CM | POA: Diagnosis not present

## 2017-05-08 DIAGNOSIS — I6523 Occlusion and stenosis of bilateral carotid arteries: Secondary | ICD-10-CM

## 2017-05-08 DIAGNOSIS — I480 Paroxysmal atrial fibrillation: Secondary | ICD-10-CM

## 2017-05-08 DIAGNOSIS — Z952 Presence of prosthetic heart valve: Secondary | ICD-10-CM | POA: Diagnosis not present

## 2017-05-08 NOTE — Patient Instructions (Signed)
Medication Instructions:  The current medical regimen is effective;  continue present plan and medications.  Testing/Procedures: Your physician has requested that you have an echocardiogram. Echocardiography is a painless test that uses sound waves to create images of your heart. It provides your doctor with information about the size and shape of your heart and how well your heart's chambers and valves are working. This procedure takes approximately one hour. There are no restrictions for this procedure.  Follow-Up: Follow up in 6 months with Dr. Antoine PocheHochrein.  You will receive a letter in the mail 2 months before you are due.  Please call us when you receive this letter to schedule your follow up appointment.  If you need a refill on your cardiac medications before your next appointment, please call your pharmacy.  Thank you for choosing [facility]!!

## 2017-05-15 ENCOUNTER — Inpatient Hospital Stay (HOSPITAL_COMMUNITY)
Admission: EM | Admit: 2017-05-15 | Discharge: 2017-05-21 | DRG: 291 | Disposition: A | Discharge status: Home Health | Payer: Medicare Other | Attending: Internal Medicine | Admitting: Internal Medicine

## 2017-05-15 ENCOUNTER — Other Ambulatory Visit: Payer: Self-pay

## 2017-05-15 ENCOUNTER — Ambulatory Visit (HOSPITAL_COMMUNITY): Payer: Medicare Other

## 2017-05-15 ENCOUNTER — Encounter (HOSPITAL_COMMUNITY): Payer: Self-pay | Admitting: *Deleted

## 2017-05-15 ENCOUNTER — Emergency Department (HOSPITAL_COMMUNITY): Payer: Medicare Other

## 2017-05-15 DIAGNOSIS — Z955 Presence of coronary angioplasty implant and graft: Secondary | ICD-10-CM | POA: Diagnosis not present

## 2017-05-15 DIAGNOSIS — J441 Chronic obstructive pulmonary disease with (acute) exacerbation: Secondary | ICD-10-CM

## 2017-05-15 DIAGNOSIS — E1122 Type 2 diabetes mellitus with diabetic chronic kidney disease: Secondary | ICD-10-CM | POA: Diagnosis present

## 2017-05-15 DIAGNOSIS — Z87891 Personal history of nicotine dependence: Secondary | ICD-10-CM

## 2017-05-15 DIAGNOSIS — M810 Age-related osteoporosis without current pathological fracture: Secondary | ICD-10-CM | POA: Diagnosis present

## 2017-05-15 DIAGNOSIS — Z7189 Other specified counseling: Secondary | ICD-10-CM

## 2017-05-15 DIAGNOSIS — R0602 Shortness of breath: Secondary | ICD-10-CM

## 2017-05-15 DIAGNOSIS — I252 Old myocardial infarction: Secondary | ICD-10-CM

## 2017-05-15 DIAGNOSIS — E785 Hyperlipidemia, unspecified: Secondary | ICD-10-CM | POA: Diagnosis present

## 2017-05-15 DIAGNOSIS — J449 Chronic obstructive pulmonary disease, unspecified: Secondary | ICD-10-CM

## 2017-05-15 DIAGNOSIS — Z66 Do not resuscitate: Secondary | ICD-10-CM | POA: Diagnosis present

## 2017-05-15 DIAGNOSIS — I4891 Unspecified atrial fibrillation: Secondary | ICD-10-CM | POA: Diagnosis not present

## 2017-05-15 DIAGNOSIS — Z9010 Acquired absence of unspecified breast and nipple: Secondary | ICD-10-CM

## 2017-05-15 DIAGNOSIS — Z8673 Personal history of transient ischemic attack (TIA), and cerebral infarction without residual deficits: Secondary | ICD-10-CM

## 2017-05-15 DIAGNOSIS — I10 Essential (primary) hypertension: Secondary | ICD-10-CM | POA: Diagnosis not present

## 2017-05-15 DIAGNOSIS — I480 Paroxysmal atrial fibrillation: Secondary | ICD-10-CM | POA: Diagnosis not present

## 2017-05-15 DIAGNOSIS — J90 Pleural effusion, not elsewhere classified: Secondary | ICD-10-CM | POA: Diagnosis not present

## 2017-05-15 DIAGNOSIS — I5033 Acute on chronic diastolic (congestive) heart failure: Secondary | ICD-10-CM | POA: Diagnosis present

## 2017-05-15 DIAGNOSIS — E039 Hypothyroidism, unspecified: Secondary | ICD-10-CM | POA: Diagnosis present

## 2017-05-15 DIAGNOSIS — I25810 Atherosclerosis of coronary artery bypass graft(s) without angina pectoris: Secondary | ICD-10-CM | POA: Diagnosis present

## 2017-05-15 DIAGNOSIS — N183 Chronic kidney disease, stage 3 (moderate): Secondary | ICD-10-CM | POA: Diagnosis present

## 2017-05-15 DIAGNOSIS — Z853 Personal history of malignant neoplasm of breast: Secondary | ICD-10-CM

## 2017-05-15 DIAGNOSIS — Z833 Family history of diabetes mellitus: Secondary | ICD-10-CM

## 2017-05-15 DIAGNOSIS — Z9981 Dependence on supplemental oxygen: Secondary | ICD-10-CM

## 2017-05-15 DIAGNOSIS — I2510 Atherosclerotic heart disease of native coronary artery without angina pectoris: Secondary | ICD-10-CM | POA: Diagnosis present

## 2017-05-15 DIAGNOSIS — I130 Hypertensive heart and chronic kidney disease with heart failure and stage 1 through stage 4 chronic kidney disease, or unspecified chronic kidney disease: Secondary | ICD-10-CM | POA: Diagnosis not present

## 2017-05-15 DIAGNOSIS — I25119 Atherosclerotic heart disease of native coronary artery with unspecified angina pectoris: Secondary | ICD-10-CM | POA: Diagnosis not present

## 2017-05-15 DIAGNOSIS — Z515 Encounter for palliative care: Secondary | ICD-10-CM

## 2017-05-15 DIAGNOSIS — Z825 Family history of asthma and other chronic lower respiratory diseases: Secondary | ICD-10-CM

## 2017-05-15 DIAGNOSIS — Z885 Allergy status to narcotic agent status: Secondary | ICD-10-CM

## 2017-05-15 DIAGNOSIS — Z7901 Long term (current) use of anticoagulants: Secondary | ICD-10-CM

## 2017-05-15 DIAGNOSIS — I361 Nonrheumatic tricuspid (valve) insufficiency: Secondary | ICD-10-CM | POA: Diagnosis not present

## 2017-05-15 DIAGNOSIS — Z880 Allergy status to penicillin: Secondary | ICD-10-CM

## 2017-05-15 DIAGNOSIS — Z888 Allergy status to other drugs, medicaments and biological substances status: Secondary | ICD-10-CM

## 2017-05-15 DIAGNOSIS — J9621 Acute and chronic respiratory failure with hypoxia: Secondary | ICD-10-CM | POA: Diagnosis present

## 2017-05-15 DIAGNOSIS — Z882 Allergy status to sulfonamides status: Secondary | ICD-10-CM

## 2017-05-15 DIAGNOSIS — J9611 Chronic respiratory failure with hypoxia: Secondary | ICD-10-CM

## 2017-05-15 DIAGNOSIS — R0902 Hypoxemia: Secondary | ICD-10-CM

## 2017-05-15 DIAGNOSIS — M25512 Pain in left shoulder: Secondary | ICD-10-CM | POA: Diagnosis present

## 2017-05-15 DIAGNOSIS — Z7951 Long term (current) use of inhaled steroids: Secondary | ICD-10-CM

## 2017-05-15 DIAGNOSIS — Z7983 Long term (current) use of bisphosphonates: Secondary | ICD-10-CM

## 2017-05-15 DIAGNOSIS — I5030 Unspecified diastolic (congestive) heart failure: Secondary | ICD-10-CM | POA: Diagnosis not present

## 2017-05-15 DIAGNOSIS — I5031 Acute diastolic (congestive) heart failure: Secondary | ICD-10-CM | POA: Diagnosis not present

## 2017-05-15 DIAGNOSIS — I359 Nonrheumatic aortic valve disorder, unspecified: Secondary | ICD-10-CM | POA: Diagnosis present

## 2017-05-15 DIAGNOSIS — Z8249 Family history of ischemic heart disease and other diseases of the circulatory system: Secondary | ICD-10-CM

## 2017-05-15 DIAGNOSIS — I509 Heart failure, unspecified: Secondary | ICD-10-CM

## 2017-05-15 DIAGNOSIS — J438 Other emphysema: Secondary | ICD-10-CM | POA: Diagnosis not present

## 2017-05-15 DIAGNOSIS — Z953 Presence of xenogenic heart valve: Secondary | ICD-10-CM | POA: Diagnosis not present

## 2017-05-15 DIAGNOSIS — J9622 Acute and chronic respiratory failure with hypercapnia: Secondary | ICD-10-CM | POA: Diagnosis present

## 2017-05-15 LAB — COMPREHENSIVE METABOLIC PANEL
ALK PHOS: 81 U/L (ref 38–126)
ALT: 10 U/L — AB (ref 14–54)
ANION GAP: 11 (ref 5–15)
AST: 19 U/L (ref 15–41)
Albumin: 3.4 g/dL — ABNORMAL LOW (ref 3.5–5.0)
BUN: 24 mg/dL — ABNORMAL HIGH (ref 6–20)
CO2: 33 mmol/L — AB (ref 22–32)
CREATININE: 1.01 mg/dL — AB (ref 0.44–1.00)
Calcium: 8.7 mg/dL — ABNORMAL LOW (ref 8.9–10.3)
Chloride: 96 mmol/L — ABNORMAL LOW (ref 101–111)
GFR calc non Af Amer: 50 mL/min — ABNORMAL LOW (ref >60)
GFR, EST AFRICAN AMERICAN: 58 mL/min — AB (ref >60)
Glucose, Bld: 106 mg/dL — ABNORMAL HIGH (ref 65–99)
Potassium: 4.2 mmol/L (ref 3.5–5.1)
Sodium: 140 mmol/L (ref 135–145)
Total Bilirubin: 0.9 mg/dL (ref 0.3–1.2)
Total Protein: 6.8 g/dL (ref 6.5–8.1)

## 2017-05-15 LAB — CBC WITH DIFFERENTIAL/PLATELET
BASOS PCT: 0 %
Basophils Absolute: 0 10*3/uL (ref 0.0–0.1)
EOS ABS: 0.1 10*3/uL (ref 0.0–0.7)
Eosinophils Relative: 1 %
HCT: 35.3 % — ABNORMAL LOW (ref 36.0–46.0)
HEMOGLOBIN: 10.4 g/dL — AB (ref 12.0–15.0)
Lymphocytes Relative: 15 %
Lymphs Abs: 0.9 10*3/uL (ref 0.7–4.0)
MCH: 27.1 pg (ref 26.0–34.0)
MCHC: 29.5 g/dL — AB (ref 30.0–36.0)
MCV: 91.9 fL (ref 78.0–100.0)
MONOS PCT: 11 %
Monocytes Absolute: 0.7 10*3/uL (ref 0.1–1.0)
NEUTROS ABS: 4.7 10*3/uL (ref 1.7–7.7)
NEUTROS PCT: 73 %
Platelets: 213 10*3/uL (ref 150–400)
RBC: 3.84 MIL/uL — ABNORMAL LOW (ref 3.87–5.11)
RDW: 14.5 % (ref 11.5–15.5)
WBC: 6.4 10*3/uL (ref 4.0–10.5)

## 2017-05-15 LAB — TROPONIN I

## 2017-05-15 LAB — BUN: BUN: 24 mg/dL — AB (ref 6–20)

## 2017-05-15 MED ORDER — NITROGLYCERIN 0.4 MG SL SUBL
0.4000 mg | SUBLINGUAL_TABLET | SUBLINGUAL | Status: DC | PRN
  Administered 2017-05-16 – 2017-05-21 (×5): 0.4 mg via SUBLINGUAL
  Filled 2017-05-15 (×6): qty 1

## 2017-05-15 MED ORDER — DOXAZOSIN MESYLATE 2 MG PO TABS
2.0000 mg | ORAL_TABLET | Freq: Every day | ORAL | Status: DC
  Administered 2017-05-15 – 2017-05-16 (×2): 2 mg via ORAL
  Filled 2017-05-15 (×2): qty 1

## 2017-05-15 MED ORDER — IPRATROPIUM-ALBUTEROL 0.5-2.5 (3) MG/3ML IN SOLN
3.0000 mL | Freq: Four times a day (QID) | RESPIRATORY_TRACT | Status: DC | PRN
  Administered 2017-05-15 – 2017-05-17 (×9): 3 mL via RESPIRATORY_TRACT
  Filled 2017-05-15 (×8): qty 3

## 2017-05-15 MED ORDER — IPRATROPIUM-ALBUTEROL 20-100 MCG/ACT IN AERS: 1.0000 | INHALATION_SPRAY | RESPIRATORY_TRACT | Status: DC | PRN

## 2017-05-15 MED ORDER — LEVOTHYROXINE SODIUM 50 MCG PO TABS
75.0000 ug | ORAL_TABLET | ORAL | Status: DC
  Administered 2017-05-17 – 2017-05-21 (×4): 75 ug via ORAL
  Filled 2017-05-15 (×3): qty 2

## 2017-05-15 MED ORDER — AMIODARONE HCL 200 MG PO TABS
200.0000 mg | ORAL_TABLET | Freq: Every day | ORAL | Status: DC
  Administered 2017-05-16 – 2017-05-21 (×6): 200 mg via ORAL
  Filled 2017-05-15 (×6): qty 1

## 2017-05-15 MED ORDER — BENZONATATE 100 MG PO CAPS: 200.0000 mg | ORAL_CAPSULE | Freq: Three times a day (TID) | ORAL | Status: DC | PRN

## 2017-05-15 MED ORDER — MELATONIN-PYRIDOXINE ER 3-10 MG PO TBCR: 1.0000 | EXTENDED_RELEASE_TABLET | Freq: Every evening | ORAL | Status: DC | PRN

## 2017-05-15 MED ORDER — FLUTICASONE PROPIONATE 50 MCG/ACT NA SUSP
2.0000 | Freq: Every day | NASAL | Status: DC | PRN
  Administered 2017-05-19: 2 via NASAL
  Filled 2017-05-15: qty 16

## 2017-05-15 MED ORDER — VITAMIN D3 125 MCG (5000 UT) PO CAPS: 1.0000 | ORAL_CAPSULE | Freq: Every day | ORAL | Status: DC

## 2017-05-15 MED ORDER — SODIUM CHLORIDE 0.9 % IV SOLN: 250.0000 mL | INTRAVENOUS | Status: DC | PRN

## 2017-05-15 MED ORDER — METOPROLOL SUCCINATE ER 50 MG PO TB24
50.0000 mg | ORAL_TABLET | Freq: Every day | ORAL | Status: DC
  Administered 2017-05-16: 50 mg via ORAL
  Filled 2017-05-15: qty 1

## 2017-05-15 MED ORDER — SODIUM CHLORIDE 0.9% FLUSH
3.0000 mL | Freq: Two times a day (BID) | INTRAVENOUS | Status: DC
  Administered 2017-05-15 – 2017-05-21 (×12): 3 mL via INTRAVENOUS

## 2017-05-15 MED ORDER — ONDANSETRON HCL 4 MG PO TABS
4.0000 mg | ORAL_TABLET | Freq: Three times a day (TID) | ORAL | Status: DC | PRN
  Administered 2017-05-18: 4 mg via ORAL
  Filled 2017-05-15: qty 1

## 2017-05-15 MED ORDER — IPRATROPIUM-ALBUTEROL 0.5-2.5 (3) MG/3ML IN SOLN
RESPIRATORY_TRACT | Status: AC
  Filled 2017-05-15: qty 3

## 2017-05-15 MED ORDER — PROMETHAZINE HCL 12.5 MG PO TABS
12.5000 mg | ORAL_TABLET | Freq: Four times a day (QID) | ORAL | Status: DC | PRN
  Filled 2017-05-15: qty 1

## 2017-05-15 MED ORDER — FUROSEMIDE 10 MG/ML IJ SOLN
40.0000 mg | Freq: Two times a day (BID) | INTRAMUSCULAR | Status: DC
  Administered 2017-05-15 – 2017-05-18 (×6): 40 mg via INTRAVENOUS
  Filled 2017-05-15 (×6): qty 4

## 2017-05-15 MED ORDER — ONDANSETRON HCL 4 MG/2ML IJ SOLN
4.0000 mg | Freq: Four times a day (QID) | INTRAMUSCULAR | Status: DC | PRN
  Administered 2017-05-20 – 2017-05-21 (×2): 4 mg via INTRAVENOUS
  Filled 2017-05-15 (×2): qty 2

## 2017-05-15 MED ORDER — LISINOPRIL 10 MG PO TABS
20.0000 mg | ORAL_TABLET | Freq: Every day | ORAL | Status: DC
  Administered 2017-05-16 – 2017-05-17 (×2): 20 mg via ORAL
  Filled 2017-05-15 (×2): qty 2

## 2017-05-15 MED ORDER — POLYETHYLENE GLYCOL 3350 17 G PO PACK
17.0000 g | PACK | Freq: Every day | ORAL | Status: DC
  Administered 2017-05-15 – 2017-05-21 (×7): 17 g via ORAL
  Filled 2017-05-15 (×8): qty 1

## 2017-05-15 MED ORDER — VITAMIN D 1000 UNITS PO TABS
5000.0000 [IU] | ORAL_TABLET | Freq: Every day | ORAL | Status: DC
  Administered 2017-05-16 – 2017-05-21 (×6): 5000 [IU] via ORAL
  Filled 2017-05-15 (×8): qty 5

## 2017-05-15 MED ORDER — MAGIC MOUTHWASH: 5.0000 mL | Freq: Every day | ORAL | Status: DC | PRN

## 2017-05-15 MED ORDER — SODIUM CHLORIDE 0.9% FLUSH: 3.0000 mL | INTRAVENOUS | Status: DC | PRN

## 2017-05-15 MED ORDER — ASPIRIN EC 81 MG PO TBEC
81.0000 mg | DELAYED_RELEASE_TABLET | Freq: Every day | ORAL | Status: DC
  Filled 2017-05-15 (×2): qty 1

## 2017-05-15 MED ORDER — ACETAMINOPHEN 325 MG PO TABS
650.0000 mg | ORAL_TABLET | ORAL | Status: DC | PRN
  Administered 2017-05-17 – 2017-05-18 (×2): 650 mg via ORAL
  Filled 2017-05-15 (×2): qty 2

## 2017-05-15 MED ORDER — LEVOTHYROXINE SODIUM 75 MCG PO TABS
112.5000 ug | ORAL_TABLET | ORAL | Status: DC
  Administered 2017-05-16 – 2017-05-20 (×2): 112.5 ug via ORAL
  Filled 2017-05-15 (×2): qty 2

## 2017-05-15 MED ORDER — GABAPENTIN 100 MG PO CAPS
100.0000 mg | ORAL_CAPSULE | Freq: Every day | ORAL | Status: DC
  Administered 2017-05-15 – 2017-05-20 (×6): 100 mg via ORAL
  Filled 2017-05-15 (×6): qty 1

## 2017-05-15 MED ORDER — APIXABAN 5 MG PO TABS
5.0000 mg | ORAL_TABLET | Freq: Two times a day (BID) | ORAL | Status: DC
  Administered 2017-05-15 – 2017-05-21 (×12): 5 mg via ORAL
  Filled 2017-05-15 (×12): qty 1

## 2017-05-15 MED ORDER — BUDESONIDE 0.5 MG/2ML IN SUSP
0.5000 mg | Freq: Two times a day (BID) | RESPIRATORY_TRACT | Status: DC
Start: 2017-05-15 — End: 2017-05-21
  Administered 2017-05-15 – 2017-05-21 (×11): 0.5 mg via RESPIRATORY_TRACT
  Filled 2017-05-15 (×12): qty 2

## 2017-05-15 MED ORDER — LORAZEPAM 0.5 MG PO TABS
0.5000 mg | ORAL_TABLET | Freq: Two times a day (BID) | ORAL | Status: DC | PRN
  Administered 2017-05-16 – 2017-05-20 (×7): 0.5 mg via ORAL
  Filled 2017-05-15 (×7): qty 1

## 2017-05-15 NOTE — H&P (Signed)
History and Physical    Julie Burns:[id number] DOB: 10/10/1930 DOA: 05/15/2017  PCP: Joette Catching, MD  Patient coming from: Home  Chief Complaint: Shortness of breath  HPI: Julie Burns is a 84 y.o. female with medical history significant of A. fib on Eliquis, congestive heart failure, status post aortic valve replacement in 2010, coronary artery disease status post stenting comes in with progressive shortness of breath for several weeks.  She also reports some lower extremity edema.  Patient reports her Lasix was decreased about a month ago by her cardiologist.  Since that time she feels like she has been gaining weight.  She denies any fevers or coughing.  She has been getting very dyspneic on exertion.  She chronically is on 2 L of oxygen at home and she is been requiring 3 recently.  Patient is referred for admission for worsening pleural effusion on the right.  Patient was previously on Lasix 40 mg p.o. twice a day this is recently been decreased to 40 mg daily.  Review of Systems: As per HPI otherwise 10 point review of systems negative.   Past Medical History:  Diagnosis Date  . Age-related osteoporosis without current pathological fracture   . Anxiety disorder   . Atrial fibrillation (HCC)   . CAD (coronary artery disease)    12/2013-DES to RCA due to NSTEMI- staged PCI to LAD and CX (not ideal for CABG; Echocardiogram 01/16/10 EF 55%, severe LVH; Moderate AS; mild ot moderate TR  . Chronic kidney disease   . Chronic respiratory failure with hypoxia (HCC)   . COPD (chronic obstructive pulmonary disease) (HCC)   . Degenerative myopia with macular hole   . Diabetes (HCC)    TYPE 2  . Hyperlipidemia   . Hypothyroidism   . Irritable bowel syndrome without diarrhea   . NSTEMI (non-ST elevated myocardial infarction) (HCC)    NSTEMI 3/05; s/p PTCA with stenting of left circumflex coronary artery 3/05; totally occluded small vessel OM  . Occlusion and stenosis of right  carotid artery    Carotid Duplex 7/13 moderate disease (<70%) of the right internal carotid arteries, this is a high bifurcation, s/p left CEA without significant stenosis.  . Polyp of colon   . Presence of prosthetic heart valve    S/P aortic valve replacement 04/29/2008   . PVD (peripheral vascular disease) (HCC)   . Sicca syndrome (HCC)   . Stroke (cerebrum) (HCC)   . Unspecified abdominal hernia without obstruction or gangrene   . Vitamin D deficiency     Past Surgical History:  Procedure Laterality Date  . AORTIC VALVE REPLACEMENT     S/P aortic valve replacement 04/29/2008   . CARDIAC CATHETERIZATION     by Flonnie Hailstone at [facility]; mLAD 30%, patent LCx stent, mRCA 50%, LVEDP 10, severe AS with AV mean gradient 46 mmHg, Right heart catheterization   . CAROTID ENDARTERECTOMY     Carotid Duplex 7/13 moderate disease (<70%) of the right internal carotid arteries, this is a high bifurcation, s/p left CEA without significant stenosis.     reports that she quit smoking about 40 years ago. Her smoking use included cigarettes. She smoked 3.00 packs per day. she has never used smokeless tobacco. She reports that she does not drink alcohol or use drugs.  Allergies  Allergen Reactions  . Bee Venom Swelling  . Codeine Hives  . Morphine And Related Other (See Comments)    Patient states she "felt weird".  Eliezer Mccoy [Amlodipine  Besylate] Swelling  . Sulfa Antibiotics   . Valium [Diazepam]   . Zetia [Ezetimibe]   . Penicillins Hives and Rash    Has patient had a PCN reaction causing immediate rash, facial/tongue/throat swelling, SOB or lightheadedness with hypotension: yes Has patient had a PCN reaction causing severe rash involving mucus membranes or skin necrosis: Unknown Has patient had a PCN reaction that required hospitalization: No Has patient had a PCN reaction occurring within the last 10 years: No If all of the above answers are "NO", then may proceed with Cephalosporin use.      Family History  Problem Relation Age of Onset  . Hypertension Mother   . CAD Mother   . Hypertension Father   . CAD Father   . Hypertension Sister   . CAD Sister   . Hypertension Brother   . CAD Brother   . Diabetes Brother   . Hypertension Brother   . Diabetes Brother   . COPD Son     Prior to Admission medications   Medication Sig Start Date End Date Taking? Authorizing Provider  alendronate (FOSAMAX) 70 MG tablet Take 70 mg by mouth once a week. Take with a full glass of water on an empty stomach. Take on Sunday's.   Yes [provider]  amiodarone (PACERONE) 200 MG tablet Take 200 mg by mouth daily.   Yes [provider]  apixaban (ELIQUIS) 5 MG TABS tablet Take 5 mg by mouth 2 (two) times daily.   Yes [provider]  benzonatate (TESSALON) 200 MG capsule Take 200 mg by mouth 3 (three) times daily as needed for cough.   Yes [provider]  budesonide (PULMICORT) 0.5 MG/2ML nebulizer solution Take 0.5 mg by nebulization 2 (two) times daily.   Yes [provider]  Cholecalciferol (VITAMIN D3) 5000 units CAPS Take 1 capsule by mouth daily.   Yes [provider]  doxazosin (CARDURA) 2 MG tablet Take 2 mg by mouth at bedtime.    Yes [provider]  fluticasone (FLONASE) 50 MCG/ACT nasal spray Place 2 sprays into both nostrils daily as needed for allergies.    Yes [provider]  furosemide (LASIX) 40 MG tablet Take 40 mg by mouth daily.   Yes [provider]  gabapentin (NEURONTIN) 100 MG capsule Take 100 mg by mouth at bedtime.    Yes [provider]  Ipratropium-Albuterol (COMBIVENT RESPIMAT) 20-100 MCG/ACT AERS respimat Inhale 1-2 puffs into the lungs every 4 (four) hours as needed for wheezing or shortness of breath. 05/10/17  Yes [provider]  ipratropium-albuterol (DUONEB) 0.5-2.5 (3) MG/3ML SOLN Take 3 mLs by nebulization every 6 (six) hours as needed.   Yes [provider]  levothyroxine (SYNTHROID, LEVOTHROID) 75 MCG tablet Take 75 mcg by mouth. Take one tablet daily by mouth except Mon. And Thurs. Take 1 1/2 tablets   Yes [provider]  lisinopril (PRINIVIL,ZESTRIL) 20 MG tablet Take 20 mg by mouth daily.   Yes [provider]  LORazepam (ATIVAN) 0.5 MG tablet Take 0.5 mg by mouth 2 (two) times daily as needed for anxiety or sleep.    Yes [provider]  magic mouthwash SOLN Take 5 mLs by mouth daily as needed for mouth pain (thrush).   Yes [provider]  Melatonin-Pyridoxine (MELATONIN TR WITH VITAMIN B6) 3-10 MG TBCR Take 1 capsule by mouth at bedtime as needed (sleep).    Yes [provider]  metoprolol succinate (TOPROL-XL) 100 MG  24 hr tablet Take 50-100 mg by mouth daily. Takes 50 mg tablet daily. If blood pressure is elevated take additional 50 mg tablet.   Yes [provider]  Multiple Vitamins-Minerals (ICAPS AREDS FORMULA PO) Take 1 capsule by mouth daily.   Yes [provider]  nitroGLYCERIN (NITROSTAT) 0.4 MG SL tablet Place 0.4 mg under the tongue every 5 (five) minutes x 3 doses as needed for chest pain.   Yes [provider]  nystatin-triamcinolone ointment (MYCOLOG) Apply 1 application topically 2 (two) times daily as needed (rash).    Yes [provider]  ondansetron (ZOFRAN) 4 MG tablet Take 4 mg by mouth every 8 (eight) hours as needed for nausea or vomiting.   Yes [provider]  polyethylene glycol (MIRALAX / GLYCOLAX) packet Take 17 g by mouth daily.   Yes [provider]  promethazine (PHENERGAN) 12.5 MG tablet Take 12.5 mg by mouth every 6 (six) hours as needed for nausea or vomiting.   Yes [provider]  ranitidine (ZANTAC) 150 MG tablet Take 150 mg by mouth 2 (two) times daily.   Yes [provider]  traMADol (ULTRAM) 50 MG tablet Take 50 mg by mouth every 6 (six) hours as needed for moderate pain.    Yes  [provider]    Physical Exam: Vitals:   05/15/17 1600 05/15/17 1630 05/15/17 1700 05/15/17 1730  BP: (!) 160/59 (!) 158/55 (!) 142/52 (!) 159/53  Pulse: 61 (!) 58 (!) 57 63  Resp: 20 (!) 21 (!) 21 (!) 21  Temp:      TempSrc:      SpO2: 94% 95% 92% 94%  Weight:      Height:        Constitutional: NAD, calm, comfortable but dyspneic at rest with speaking Vitals:   05/15/17 1600 05/15/17 1630 05/15/17 1700 05/15/17 1730  BP: (!) 160/59 (!) 158/55 (!) 142/52 (!) 159/53  Pulse: 61 (!) 58 (!) 57 63  Resp: 20 (!) 21 (!) 21 (!) 21  Temp:      TempSrc:      SpO2: 94% 95% 92% 94%  Weight:      Height:       Eyes: PERRL, lids and conjunctivae normal ENMT: Mucous membranes are moist. Posterior pharynx clear of any exudate or lesions.Normal dentition.  Neck: normal, supple, no masses, no thyromegaly Respiratory: clear to auscultation bilaterally, no wheezing, no crackles. Normal respiratory effort. No accessory muscle use.  Cardiovascular: Regular rate and rhythm, no murmurs / rubs / gallops. No extremity edema. 2+ pedal pulses. No carotid bruits.  Abdomen: no tenderness, no masses palpated. No hepatosplenomegaly. Bowel sounds positive.  Musculoskeletal: no clubbing / cyanosis. No joint deformity upper and lower extremities. Good ROM, no contractures. Normal muscle tone.  Skin: no rashes, lesions, ulcers. No induration Neurologic: CN 2-12 grossly intact. Sensation intact, DTR normal. Strength 5/5 in all 4.  Psychiatric: Normal judgment and insight. Alert and oriented x 3. Normal mood.    Labs on Admission: I have personally reviewed following labs and imaging studies  CBC: Recent Labs  Lab 05/15/17 1556  WBC 6.4  NEUTROABS 4.7  HGB 10.4*  HCT 35.3*  MCV 91.9  PLT 213   Basic Metabolic Panel: Recent Labs  Lab 05/15/17 1556 05/15/17 1600  NA 140  --   K 4.2  --   CL 96*  --   CO2 33*  --   GLUCOSE 106*  --   BUN 24* 24*  CREATININE 1.01*  --   CALCIUM  8.7*  --    GFR: Estimated Creatinine Clearance: 50.6 mL/min (A) (by C-G formula based on SCr of 1.01 mg/dL (H)). Liver Function Tests: Recent Labs  Lab 05/15/17 1556  AST 19  ALT 10*  ALKPHOS 81  BILITOT 0.9  PROT 6.8  ALBUMIN 3.4*   No results for input(s): LIPASE, AMYLASE in the last 168 hours. No results for input(s): AMMONIA in the last 168 hours. Coagulation Profile: No results for input(s): INR, PROTIME in the last 168 hours. Cardiac Enzymes: Recent Labs  Lab 05/15/17 1600  TROPONINI <0.03   BNP (last 3 results) No results for input(s): PROBNP in the last 8760 hours. HbA1C: No results for input(s): HGBA1C in the last 72 hours. CBG: No results for input(s): GLUCAP in the last 168 hours. Lipid Profile: No results for input(s): CHOL, HDL, LDLCALC, TRIG, CHOLHDL, LDLDIRECT in the last 72 hours. Thyroid Function Tests: No results for input(s): TSH, T4TOTAL, FREET4, T3FREE, THYROIDAB in the last 72 hours. Anemia Panel: No results for input(s): VITAMINB12, FOLATE, FERRITIN, TIBC, IRON, RETICCTPCT in the last 72 hours. Urine analysis: No results found for: COLORURINE, APPEARANCEUR, LABSPEC, PHURINE, GLUCOSEU, HGBUR, BILIRUBINUR, KETONESUR, PROTEINUR, UROBILINOGEN, NITRITE, LEUKOCYTESUR Sepsis Labs: !!!!!!!!!!!!!!!!!!!!!!!!!!!!!!!!!!!!!!!!!!!! @LABRCNTIP (procalcitonin:4,lacticidven:4) )No results found for this or any previous visit (from the past 240 hour(s)).   Radiological Exams on Admission: Dg Chest 2 View  Result Date: 05/15/2017 CLINICAL DATA:  Increased sob x 1 week. Has been doing neb treatments but they are not really helping. O2 sats at 92-93% on 3L when she was brought in by EMS EXAM: CHEST - 2 VIEW COMPARISON:  Chest radiograph 05/21/2012, CT 05/23/2012 FINDINGS: Sternotomy wires overlie normal cardiac silhouette. There is a moderate RIGHT effusion which is increased from prior (05/21/2012). No focal consolidation. LEFT lung relatively clear. There is fine  interstitial pattern. No aggressive osseous lesion. IMPRESSION: Chronic unilateral RIGHT pleural effusion is increased from comparison exam 5 years prior. Mild interstitial edema. Electronically Signed   By: Genevive BiStewart  Edmunds M.D.   On: 05/15/2017 16:12    EKG: Independently reviewed.  Normal sinus rhythm with PVC Chest x-ray reviewed right pleural effusion  old chart reviewed Case discussed with EDP   Assessment/Plan 84 year old female with right-sided pleural effusion likely all related to congestive heart failure exacerbation  Principal Problem:   CHF (congestive heart failure) (HCC) exacerbation increase Lasix to 40 mg IV every 12 hours.  Obtain cardiac echo.  It appears that cardiology-did order her an echo to be done as an outpatient which has not been done yet so will order now.  Order troponin and BNP level.  I have discussed thoracentesis with the patient the plan is if she does not clinically respond and improve with increasing her Lasix to consider thoracentesis of the pleural effusion.  There is no other signs of concerns for infection at this time.  Active Problems:   CAD (coronary artery disease) of artery bypass graft-noted   A-fib (HCC)-currently normal sinus rhythm we will continue Eliquis   COPD (chronic obstructive pulmonary disease) (HCC)-stable at this time   Acute on chronic respiratory failure with hypoxia (HCC)-continue her on 3 L of oxygen   Pleural effusion on right related to her congestive heart failure as above-consider thoracentesis if she does not improve with Lasix in the next 24-48 hours      DVT prophylaxis: Eliquis Code Status: Full Family Communication: Daughter Disposition Plan: Per day team Consults called: None Admission status: Admission  DAVID,RACHAL A MD [facility]  If 7PM-7AM, please contact night-coverage [email or web address] Password [facility]  05/15/2017, 6:23 PM

## 2017-05-15 NOTE — ED Triage Notes (Addendum)
Pt brought in by [facility] with c/o SOB x 1 week. Pt reports she gets completely winded by walking across the room at home. Pt has used breathing treatments at home with some relief. O2 sat 92-93% on 3L when EMS arrived. Pt was given Albuterol 5mg  via neb in route to ED by EMS.

## 2017-05-15 NOTE — ED Notes (Signed)
Respiratory called to bedside for breathing treatment. Pt's O2 sat low 80's on 4L O2 via [place]. Pt has expiratory wheezing and diminished lung sounds.

## 2017-05-15 NOTE — ED Provider Notes (Signed)
[facility] EMERGENCY DEPARTMENT Provider Note   CSN: [id number] Arrival date & time: 05/15/17  1411     History   Chief Complaint Chief Complaint  Patient presents with  . Shortness of Breath    HPI Julie Burns is a 84 y.o. female.  Patient brought in by her daughter.  Brought in by EMS.  Patient said increased shortness of breath for a week.  Patient states she normally has some shortness of breath and normally on 3 L of oxygen.  But she is getting winded with minimal exertion.  Patient has breathing treatments at home.  Patient was satting 92% on 3 L when EMS arrived.  Patient was given albuterol in route by EMS.  No real change.  Patient upon arrival here oxygen sats on 3 L was 92%.  Patient denies any chest pain.  Patient does states she has a history of leg swelling but it is improved.  Patient is on Eliquis based on her past medical history for atrial fibrillation.  Patient is also had aortic valve replacement.  That was in 2010.  Patient denies any upper respiratory symptoms fevers chest pain.  No syncope.      Past Medical History:  Diagnosis Date  . Age-related osteoporosis without current pathological fracture   . Anxiety disorder   . Atrial fibrillation (HCC)   . CAD (coronary artery disease)    12/2013-DES to RCA due to NSTEMI- staged PCI to LAD and CX (not ideal for CABG; Echocardiogram 01/16/10 EF 55%, severe LVH; Moderate AS; mild ot moderate TR  . Chronic kidney disease   . Chronic respiratory failure with hypoxia (HCC)   . COPD (chronic obstructive pulmonary disease) (HCC)   . Degenerative myopia with macular hole   . Diabetes (HCC)    TYPE 2  . Hyperlipidemia   . Hypothyroidism   . Irritable bowel syndrome without diarrhea   . NSTEMI (non-ST elevated myocardial infarction) (HCC)    NSTEMI 3/05; s/p PTCA with stenting of left circumflex coronary artery 3/05; totally occluded small vessel OM  . Occlusion and stenosis of right carotid artery    Carotid  Duplex 7/13 moderate disease (<70%) of the right internal carotid arteries, this is a high bifurcation, s/p left CEA without significant stenosis.  . Polyp of colon   . Presence of prosthetic heart valve    S/P aortic valve replacement 04/29/2008   . PVD (peripheral vascular disease) (HCC)   . Sicca syndrome (HCC)   . Stroke (cerebrum) (HCC)   . Unspecified abdominal hernia without obstruction or gangrene   . Vitamin D deficiency     Patient Active Problem List   Diagnosis Date Noted  . CHF (congestive heart failure) (HCC) 05/15/2017  . COPD (chronic obstructive pulmonary disease) (HCC) 05/15/2017  . Acute on chronic respiratory failure with hypoxia (HCC) 05/15/2017  . Pleural effusion on right 05/15/2017  . SOB (shortness of breath)   . Coronary artery disease involving native coronary artery of native heart without angina pectoris 01/31/2017  . Stenosis of carotid artery 01/31/2017  . Obstructive chronic bronchitis with exacerbation (HCC) 06/15/2012  . Pain in joint, shoulder region 05/27/2012  . CAD (coronary artery disease) of artery bypass graft 05/27/2012  . COPD exacerbation (HCC) 05/27/2012  . Aortic valve disorders 05/27/2012  . A-fib (HCC) 05/27/2012    Past Surgical History:  Procedure Laterality Date  . AORTIC VALVE REPLACEMENT     S/P aortic valve replacement 04/29/2008   . CARDIAC CATHETERIZATION  by Flonnie Hailstone at [facility]; mLAD 30%, patent LCx stent, mRCA 50%, LVEDP 10, severe AS with AV mean gradient 46 mmHg, Right heart catheterization   . CAROTID ENDARTERECTOMY     Carotid Duplex 7/13 moderate disease (<70%) of the right internal carotid arteries, this is a high bifurcation, s/p left CEA without significant stenosis.    OB History    No data available       Home Medications    Prior to Admission medications   Medication Sig Start Date End Date Taking? Authorizing Provider  alendronate (FOSAMAX) 70 MG tablet Take 70 mg by mouth once a week. Take with a  full glass of water on an empty stomach. Take on Sunday's.   Yes [provider]  amiodarone (PACERONE) 200 MG tablet Take 200 mg by mouth daily.   Yes [provider]  apixaban (ELIQUIS) 5 MG TABS tablet Take 5 mg by mouth 2 (two) times daily.   Yes [provider]  benzonatate (TESSALON) 200 MG capsule Take 200 mg by mouth 3 (three) times daily as needed for cough.   Yes [provider]  budesonide (PULMICORT) 0.5 MG/2ML nebulizer solution Take 0.5 mg by nebulization 2 (two) times daily.   Yes [provider]  Cholecalciferol (VITAMIN D3) 5000 units CAPS Take 1 capsule by mouth daily.   Yes [provider]  doxazosin (CARDURA) 2 MG tablet Take 2 mg by mouth at bedtime.    Yes [provider]  fluticasone (FLONASE) 50 MCG/ACT nasal spray Place 2 sprays into both nostrils daily as needed for allergies.    Yes [provider]  furosemide (LASIX) 40 MG tablet Take 40 mg by mouth daily.   Yes [provider]  gabapentin (NEURONTIN) 100 MG capsule Take 100 mg by mouth at bedtime.    Yes [provider]  Ipratropium-Albuterol (COMBIVENT RESPIMAT) 20-100 MCG/ACT AERS respimat Inhale 1-2 puffs into the lungs every 4 (four) hours as needed for wheezing or shortness of breath. 05/10/17  Yes [provider]  ipratropium-albuterol (DUONEB) 0.5-2.5 (3) MG/3ML SOLN Take 3 mLs by nebulization every 6 (six) hours as needed.   Yes [provider]  levothyroxine (SYNTHROID, LEVOTHROID) 75 MCG tablet Take 75 mcg by mouth. Take one tablet daily by mouth except Mon. And Thurs. Take 1 1/2 tablets   Yes [provider]  lisinopril (PRINIVIL,ZESTRIL) 20 MG tablet Take 20 mg by mouth daily.   Yes [provider]  LORazepam (ATIVAN) 0.5 MG tablet Take 0.5 mg by mouth 2 (two) times daily as needed for anxiety or sleep.    Yes [provider]  magic mouthwash SOLN Take 5 mLs by mouth daily as  needed for mouth pain (thrush).   Yes [provider]  Melatonin-Pyridoxine (MELATONIN TR WITH VITAMIN B6) 3-10 MG TBCR Take 1 capsule by mouth at bedtime as needed (sleep).    Yes [provider]  metoprolol succinate (TOPROL-XL) 100 MG 24 hr tablet Take 50-100 mg by mouth daily. Takes 50 mg tablet daily. If blood pressure is elevated take additional 50 mg tablet.   Yes [provider]  Multiple Vitamins-Minerals (ICAPS AREDS FORMULA PO) Take 1 capsule by mouth daily.   Yes [provider]  nitroGLYCERIN (NITROSTAT) 0.4 MG SL tablet Place 0.4 mg under the tongue every 5 (five) minutes x 3 doses as needed for chest pain.   Yes [provider]  nystatin-triamcinolone ointment (MYCOLOG) Apply 1 application topically 2 (two) times daily  as needed (rash).    Yes [provider]  ondansetron (ZOFRAN) 4 MG tablet Take 4 mg by mouth every 8 (eight) hours as needed for nausea or vomiting.   Yes [provider]  polyethylene glycol (MIRALAX / GLYCOLAX) packet Take 17 g by mouth daily.   Yes [provider]  promethazine (PHENERGAN) 12.5 MG tablet Take 12.5 mg by mouth every 6 (six) hours as needed for nausea or vomiting.   Yes [provider]  ranitidine (ZANTAC) 150 MG tablet Take 150 mg by mouth 2 (two) times daily.   Yes [provider]  traMADol (ULTRAM) 50 MG tablet Take 50 mg by mouth every 6 (six) hours as needed for moderate pain.    Yes [provider]    Family History Family History  Problem Relation Age of Onset  . Hypertension Mother   . CAD Mother   . Hypertension Father   . CAD Father   . Hypertension Sister   . CAD Sister   . Hypertension Brother   . CAD Brother   . Diabetes Brother   . Hypertension Brother   . Diabetes Brother   . COPD Son     Social History Social History   Tobacco Use  . Smoking status: Former Smoker    Packs/day: 3.00    Types: Cigarettes    Last attempt  to quit: 10/23/1976    Years since quitting: 40.5  . Smokeless tobacco: Never Used  Substance Use Topics  . Alcohol use: No  . Drug use: No     Allergies   Bee venom; Codeine; Morphine and related; Norvasc [amlodipine besylate]; Sulfa antibiotics; Valium [diazepam]; Zetia [ezetimibe]; and Penicillins   Review of Systems Review of Systems  Constitutional: Positive for fatigue. Negative for fever.  HENT: Negative for congestion.   Eyes: Negative for redness.  Respiratory: Positive for shortness of breath.   Cardiovascular: Positive for leg swelling. Negative for chest pain.  Gastrointestinal: Negative for abdominal pain.  Genitourinary: Negative for dysuria.  Musculoskeletal: Negative for myalgias.  Skin: Negative for rash.  Neurological: Negative for headaches.  Hematological: Does not bruise/bleed easily.  Psychiatric/Behavioral: Negative for confusion.     Physical Exam Updated Vital Signs BP (!) 160/58   Pulse 67   Temp 97.9 F (36.6 C) (Oral)   Resp (!) 21   Ht 1.676 m (5\' 6" )   Wt 104.3 kg (230 lb)   SpO2 (!) 89%   BMI 37.12 kg/m   Physical Exam  Constitutional: She is oriented to person, place, and time. She appears well-developed and well-nourished. No distress.  HENT:  Head: Normocephalic and atraumatic.  Mouth/Throat: Oropharynx is clear and moist.  Eyes: Conjunctivae and EOM are normal. Pupils are equal, round, and reactive to light.  Neck: Neck supple.  Pulmonary/Chest: Effort normal. She has no wheezes.  Decreased breath sounds on the right.  Abdominal: Soft. Bowel sounds are normal. There is no tenderness.  Musculoskeletal: Normal range of motion. She exhibits edema.  Bilateral lower extremity edema.  No significant erythema.  Neurological: She is alert and oriented to person, place, and time. No cranial nerve deficit or sensory deficit. She exhibits normal muscle tone. Coordination normal.  Skin: Skin is warm. No rash noted.  Nursing note and  vitals reviewed.    ED Treatments / Results  Labs (all labs ordered are listed, but only abnormal results are displayed) Labs Reviewed  COMPREHENSIVE METABOLIC PANEL - Abnormal; Notable for the following components:  Result Value   Chloride 96 (*)    CO2 33 (*)    Glucose, Bld 106 (*)    BUN 24 (*)    Creatinine, Ser 1.01 (*)    Calcium 8.7 (*)    Albumin 3.4 (*)    ALT 10 (*)    GFR calc non Af Amer 50 (*)    GFR calc Af Amer 58 (*)    All other components within normal limits  CBC WITH DIFFERENTIAL/PLATELET - Abnormal; Notable for the following components:   RBC 3.84 (*)    Hemoglobin 10.4 (*)    HCT 35.3 (*)    MCHC 29.5 (*)    All other components within normal limits  BUN - Abnormal; Notable for the following components:   BUN 24 (*)    All other components within normal limits  TROPONIN I  BASIC METABOLIC PANEL  TROPONIN I  TROPONIN I  TROPONIN I    EKG  EKG Interpretation  Date/Time:  Wednesday May 15 2017 15:02:36 EDT Ventricular Rate:  60 PR Interval:    QRS Duration: 95 QT Interval:  463 QTC Calculation: 463 R Axis:   -10 Text Interpretation:  Sinus rhythm Ventricular premature complex Short PR interval Abnormal R-wave progression, early transition Nonspecific repol abnormality, diffuse leads No previous ECGs available Confirmed by Vanetta Mulders [phone]) on 05/15/2017 3:19:14 PM       Radiology Dg Chest 2 View  Result Date: 05/15/2017 CLINICAL DATA:  Increased sob x 1 week. Has been doing neb treatments but they are not really helping. O2 sats at 92-93% on 3L when she was brought in by EMS EXAM: CHEST - 2 VIEW COMPARISON:  Chest radiograph 05/21/2012, CT 05/23/2012 FINDINGS: Sternotomy wires overlie normal cardiac silhouette. There is a moderate RIGHT effusion which is increased from prior (05/21/2012). No focal consolidation. LEFT lung relatively clear. There is fine interstitial pattern. No aggressive osseous lesion. IMPRESSION: Chronic  unilateral RIGHT pleural effusion is increased from comparison exam 5 years prior. Mild interstitial edema. Electronically Signed   By: Genevive Bi M.D.   On: 05/15/2017 16:12    Procedures Procedures (including critical care time)  Medications Ordered in ED Medications  amiodarone (PACERONE) tablet 200 mg (not administered)  ipratropium-albuterol (DUONEB) 0.5-2.5 (3) MG/3ML nebulizer solution 3 mL (3 mLs Nebulization Given 05/15/17 1855)  Melatonin-Pyridoxine 3-10 MG TBCR 1 capsule (not administered)  budesonide (PULMICORT) nebulizer solution 0.5 mg (not administered)  benzonatate (TESSALON) capsule 200 mg (not administered)  fluticasone (FLONASE) 50 MCG/ACT nasal spray 2 spray (not administered)  doxazosin (CARDURA) tablet 2 mg (not administered)  apixaban (ELIQUIS) tablet 5 mg (not administered)  levothyroxine (SYNTHROID, LEVOTHROID) tablet 75 mcg (not administered)  lisinopril (PRINIVIL,ZESTRIL) tablet 20 mg (not administered)  metoprolol succinate (TOPROL-XL) 24 hr tablet 50-100 mg (not administered)  polyethylene glycol (MIRALAX / GLYCOLAX) packet 17 g (not administered)  LORazepam (ATIVAN) tablet 0.5 mg (not administered)  nitroGLYCERIN (NITROSTAT) SL tablet 0.4 mg (not administered)  gabapentin (NEURONTIN) capsule 100 mg (not administered)  magic mouthwash (not administered)  Ipratropium-Albuterol (COMBIVENT) respimat 1-2 puff (not administered)  Vitamin D3 CAPS 5,000 Units (5,000 Units Oral Not Given 05/15/17 1852)  ondansetron (ZOFRAN) tablet 4 mg (not administered)  promethazine (PHENERGAN) tablet 12.5 mg (not administered)  sodium chloride flush (NS) 0.9 % injection 3 mL (not administered)  sodium chloride flush (NS) 0.9 % injection 3 mL (not administered)  0.9 %  sodium chloride infusion (not administered)  acetaminophen (TYLENOL) tablet 650 mg (not administered)  ondansetron (ZOFRAN) injection 4 mg (not administered)  furosemide (LASIX) injection 40 mg (not  administered)  aspirin EC tablet 81 mg (not administered)     Initial Impression / Assessment and Plan / ED Course  I have reviewed the triage vital signs and the nursing notes.  Pertinent labs & imaging results that were available during my care of the patient were reviewed by me and considered in my medical decision making (see chart for details).     Workup with labs without significant abnormalities.  Believe the patient's exertional shortness of breath is due to this enlarging right-sided pleural effusion.  Even at rest patient at times will decrease her sats down into the upper 80s.  But for the most part on 3 L it stays in the low 90s.  Discussed with hospitalist they will admit.  Added on BUN and troponin to help them with their admission.  Patient without any chest pain.  EKG was sinus rhythm with a rate of 60.  No evidence of any atrial fibrillation.  Patient still taking her blood thinner.  Final Clinical Impressions(s) / ED Diagnoses   Final diagnoses:  SOB (shortness of breath)  Hypoxia  Pleural effusion on right    ED Discharge Orders    None       Vanetta MuldersZackowski, Jyoti Harju, MD 05/15/17 1900

## 2017-05-16 ENCOUNTER — Inpatient Hospital Stay (HOSPITAL_COMMUNITY): Payer: Medicare Other

## 2017-05-16 DIAGNOSIS — I5033 Acute on chronic diastolic (congestive) heart failure: Secondary | ICD-10-CM

## 2017-05-16 DIAGNOSIS — J9611 Chronic respiratory failure with hypoxia: Secondary | ICD-10-CM

## 2017-05-16 DIAGNOSIS — I10 Essential (primary) hypertension: Secondary | ICD-10-CM

## 2017-05-16 DIAGNOSIS — Z953 Presence of xenogenic heart valve: Secondary | ICD-10-CM

## 2017-05-16 DIAGNOSIS — I4891 Unspecified atrial fibrillation: Secondary | ICD-10-CM

## 2017-05-16 DIAGNOSIS — J9621 Acute and chronic respiratory failure with hypoxia: Secondary | ICD-10-CM

## 2017-05-16 DIAGNOSIS — I2510 Atherosclerotic heart disease of native coronary artery without angina pectoris: Secondary | ICD-10-CM

## 2017-05-16 DIAGNOSIS — J90 Pleural effusion, not elsewhere classified: Secondary | ICD-10-CM

## 2017-05-16 DIAGNOSIS — I361 Nonrheumatic tricuspid (valve) insufficiency: Secondary | ICD-10-CM

## 2017-05-16 DIAGNOSIS — I25119 Atherosclerotic heart disease of native coronary artery with unspecified angina pectoris: Secondary | ICD-10-CM

## 2017-05-16 DIAGNOSIS — I509 Heart failure, unspecified: Secondary | ICD-10-CM

## 2017-05-16 LAB — BASIC METABOLIC PANEL
ANION GAP: 13 (ref 5–15)
BUN: 20 mg/dL (ref 6–20)
CALCIUM: 8.7 mg/dL — AB (ref 8.9–10.3)
CO2: 28 mmol/L (ref 22–32)
Chloride: 98 mmol/L — ABNORMAL LOW (ref 101–111)
Creatinine, Ser: 0.98 mg/dL (ref 0.44–1.00)
GFR calc non Af Amer: 52 mL/min — ABNORMAL LOW (ref >60)
GFR, EST AFRICAN AMERICAN: 60 mL/min — AB (ref >60)
Glucose, Bld: 131 mg/dL — ABNORMAL HIGH (ref 65–99)
POTASSIUM: 5 mmol/L (ref 3.5–5.1)
SODIUM: 139 mmol/L (ref 135–145)

## 2017-05-16 LAB — ECHOCARDIOGRAM COMPLETE
HEIGHTINCHES: 66 in
Weight: 3686.09 oz

## 2017-05-16 LAB — TROPONIN I
TROPONIN I: 0.03 ng/mL — AB (ref <0.03)
Troponin I: <0.03 ng/mL (ref <0.03)

## 2017-05-16 MED ORDER — ORAL CARE MOUTH RINSE
15.0000 mL | Freq: Two times a day (BID) | OROMUCOSAL | Status: DC
  Administered 2017-05-16 – 2017-05-21 (×9): 15 mL via OROMUCOSAL

## 2017-05-16 MED ORDER — METOPROLOL SUCCINATE ER 25 MG PO TB24
25.0000 mg | ORAL_TABLET | Freq: Every day | ORAL | Status: DC
  Administered 2017-05-17 – 2017-05-21 (×5): 25 mg via ORAL
  Filled 2017-05-16 (×5): qty 1

## 2017-05-16 NOTE — Progress Notes (Signed)
PROGRESS NOTE  Julie Burns ZOX:[id number] DOB: 1930/08/19 DOA: 05/15/2017 PCP: Joette Catching, MD  Brief History:  84 year old female with a history of atrial fibrillation, coronary disease, CKD, chronic respiratory failure on 3 L, COPD, diabetes mellitus type 2, hyperlipidemia, hypothyroidism presenting with 1 week history of worsening shortness of breath.  The patient has been having dyspnea on minimal exertion.  She has also been complaining worsening lower extremity edema.  The patient is also complaining of intermittent left shoulder pain.  She feels this may be related to angina.  She takes a nitroglycerin which she states helps the pain.  She states that her primary care provider, Dr. Denzil Magnuson decreased her furosemide from 40 mg twice daily to once daily approximately 1 month prior to this admission.  Upon EMS arrival, the patient had oxygen saturation of 92% on 3 L. She had AVR in 2010 (21mm Magna valve) and PAF on anticoagulation and amiodarone.  Upon presentation, the patient was noted to have an increased right pleural effusion on chest x-ray with interstitial edema.  She was started on intravenous furosemide.    Assessment/Plan: Acute CHF, type unspecified  -work up in progress -daily weights -accurate I/O's -fluid restrict -Echo--results pending -continue IV Lasix -Continue metoprolol succinate  Left shoulder pain -Concerned about anginal equivalent -Cardiology consult -Relieved with nitroglycerin  Atrial fibrillation, type unspecified -Continue apixaban -Continue amiodarone -Remains in sinus rhythm  Coronary artery disease -No chest pain presently -DES to the RCA staged PCI to the LAD and circumflex in 2015.  She was thought not to be a CABG candidate -Continue aspirin  Bioprosthetic AVR -Placed in 2010 -Echocardiogram  Chronic respiratory failure with hypoxia -She has chronic dyspnea on 3 L at baseline -She gets around with a walker  Right pleural  effusion -Request thoracocentesis  Essential hypertension -Continue metoprolol succinate, lisinopril, Cardura  COPD -Compensated presently -Continue Pulmicort   Hypothyroidism -Continue Synthroid  Carotid stenosis -Status post left CEA -She has a moderate right ICA stenosis -Due for repeat carotid ultrasound December 2019       Disposition Plan:   Home in 2-3 days  Family Communication:   No Family at bedside--Total time spent 35 minutes.  Greater than 50% spent face to face counseling and coordinating care.   Consultants:  cardiology  Code Status:  FULL   DVT Prophylaxis:  apixaban   Procedures: As Listed in Progress Note Above  Antibiotics: None    Subjective: Patient denies fevers, chills, headache, chest pain,  nausea, vomiting, diarrhea, abdominal pain, dysuria, hematuria, hematochezia, and melena.  She feels that her shortness of breath is only minimally improved.  She has shortness of breath with minimal exertion.    Objective: Vitals:   05/16/17 0629 05/16/17 0930 05/16/17 1010 05/16/17 1016  BP: (!) 132/30     Pulse: 62     Resp: (!) 22     Temp: 98.4 F (36.9 C)     TempSrc: Oral     SpO2: 95% 91% 93% 93%  Weight: 104.5 kg (230 lb 6.1 oz)     Height:        Intake/Output Summary (Last 24 hours) at 05/16/2017 1133 Last data filed at 05/16/2017 0700 Gross per 24 hour  Intake 246 ml  Output 1400 ml  Net -1154 ml   Weight change:  Exam:   General:  Pt is alert, follows commands appropriately, not in acute distress  HEENT: No icterus, No thrush, No  neck mass, [place]/AT  Cardiovascular: RRR, S1/S2, no rubs, no gallops  Respiratory: Bibasilar crackles.  Diminished breath sounds at the bases.  No wheezing.  Abdomen: Soft/+BS, non tender, non distended, no guarding  Extremities: 2 +LE edema, No lymphangitis, No petechiae, No rashes, no synovitis   Data Reviewed: I have personally reviewed following labs and imaging studies Basic  Metabolic Panel: Recent Labs  Lab 05/15/17 1556 05/15/17 1600 05/16/17 0648  NA 140  --  139  K 4.2  --  5.0  CL 96*  --  98*  CO2 33*  --  28  GLUCOSE 106*  --  131*  BUN 24* 24* 20  CREATININE 1.01*  --  0.98  CALCIUM 8.7*  --  8.7*   Liver Function Tests: Recent Labs  Lab 05/15/17 1556  AST 19  ALT 10*  ALKPHOS 81  BILITOT 0.9  PROT 6.8  ALBUMIN 3.4*   No results for input(s): LIPASE, AMYLASE in the last 168 hours. No results for input(s): AMMONIA in the last 168 hours. Coagulation Profile: No results for input(s): INR, PROTIME in the last 168 hours. CBC: Recent Labs  Lab 05/15/17 1556  WBC 6.4  NEUTROABS 4.7  HGB 10.4*  HCT 35.3*  MCV 91.9  PLT 213   Cardiac Enzymes: Recent Labs  Lab 05/15/17 1600 05/15/17 1827 05/16/17 0024 05/16/17 0648  TROPONINI <0.03 <0.03 0.03* <0.03   BNP: Invalid input(s): POCBNP CBG: No results for input(s): GLUCAP in the last 168 hours. HbA1C: No results for input(s): HGBA1C in the last 72 hours. Urine analysis: No results found for: COLORURINE, APPEARANCEUR, LABSPEC, PHURINE, GLUCOSEU, HGBUR, BILIRUBINUR, KETONESUR, PROTEINUR, UROBILINOGEN, NITRITE, LEUKOCYTESUR Sepsis Labs: @LABRCNTIP (procalcitonin:4,lacticidven:4) )No results found for this or any previous visit (from the past 240 hour(s)).   Scheduled Meds: . amiodarone  200 mg Oral Daily  . apixaban  5 mg Oral BID  . aspirin EC  81 mg Oral Daily  . budesonide  0.5 mg Nebulization BID  . cholecalciferol  5,000 Units Oral Daily  . doxazosin  2 mg Oral QHS  . furosemide  40 mg Intravenous Q12H  . gabapentin  100 mg Oral QHS  . levothyroxine  112.5 mcg Oral Once per day on Mon Thu  . [START ON 05/17/2017] levothyroxine  75 mcg Oral Once per day on Sun Tue Wed Fri Sat  . lisinopril  20 mg Oral Daily  . mouth rinse  15 mL Mouth Rinse BID  . metoprolol succinate  50 mg Oral Daily  . polyethylene glycol  17 g Oral Daily  . sodium chloride flush  3 mL Intravenous  Q12H   Continuous Infusions: . sodium chloride      Procedures/Studies: Dg Chest 2 View  Result Date: 05/15/2017 CLINICAL DATA:  Increased sob x 1 week. Has been doing neb treatments but they are not really helping. O2 sats at 92-93% on 3L when she was brought in by EMS EXAM: CHEST - 2 VIEW COMPARISON:  Chest radiograph 05/21/2012, CT 05/23/2012 FINDINGS: Sternotomy wires overlie normal cardiac silhouette. There is a moderate RIGHT effusion which is increased from prior (05/21/2012). No focal consolidation. LEFT lung relatively clear. There is fine interstitial pattern. No aggressive osseous lesion. IMPRESSION: Chronic unilateral RIGHT pleural effusion is increased from comparison exam 5 years prior. Mild interstitial edema. Electronically Signed   By: Genevive Bi M.D.   On: 05/15/2017 16:12    Catarina Hartshorn, DO  [facility] Pager [phone]  If 7PM-7AM, please contact night-coverage [email or web address] Password  [email or web address] 05/16/2017, 11:33 AM   LOS: 1 day

## 2017-05-16 NOTE — Evaluation (Signed)
Physical Therapy Evaluation Patient Details Name: Julie Burns MRN: [id number] DOB: 1931-01-24 Today's Date: 05/16/2017   History of Present Illness  Julie Burns is a 84 y.o. female with medical history significant of A. fib on Eliquis, congestive heart failure, status post aortic valve replacement in 2010, coronary artery disease status post stenting comes in with progressive shortness of breath for several weeks.  She also reports some lower extremity edema.  Patient reports her Lasix was decreased about a month ago by her cardiologist.  Since that time she feels like she has been gaining weight.  She denies any fevers or coughing.  She has been getting very dyspneic on exertion.  She chronically is on 2 L of oxygen at home and she is been requiring 3 recently.  Patient is referred for admission for worsening pleural effusion on the right.  Patient was previously on Lasix 40 mg p.o. twice a day this is recently been decreased to 40 mg daily.    Clinical Impression  Patient functioning near baseline for functional mobility and gait, demonstrates slightly labored cadence without loss of balance, limited mostly due to fatigue and SOB, O2 saturation dropped to 83% while on 4 LPM O2 after walking , increased to 92% while seated at bedside.  Patient will benefit from continued physical therapy in hospital and recommended venue below to increase strength, balance, endurance for safe ADLs and gait.    Follow Up Recommendations Home health PT;Supervision - Intermittent    Equipment Recommendations  None recommended by PT    Recommendations for Other Services       Precautions / Restrictions Precautions Precautions: Fall Restrictions Weight Bearing Restrictions: No      Mobility  Bed Mobility Overal bed mobility: Needs Assistance Bed Mobility: Supine to Sit;Sit to Supine     Supine to sit: Supervision Sit to supine: Supervision      Transfers Overall transfer level: Needs  assistance Equipment used: Rolling walker (2 wheeled) Transfers: Sit to/from [employer] Sit to Stand: Supervision Stand pivot transfers: Supervision       General transfer comment: slightly labored movement  Ambulation/Gait Ambulation/Gait assistance: Supervision Ambulation Distance (Feet): 50 Feet Assistive device: Rolling walker (2 wheeled) Gait Pattern/deviations: Decreased step length - right;Decreased step length - left;Decreased stride length   Gait velocity interpretation: Below normal speed for age/gender General Gait Details: slightly labored slow cadence without loss of balance, on 4 LPM O2 and limited secondary to c/o fatigue  Stairs            Wheelchair Mobility    Modified Rankin (Stroke Patients Only)       Balance Overall balance assessment: Mild deficits observed, not formally tested                                           Pertinent Vitals/Pain Pain Assessment: 0-10 Pain Score: 2  Pain Location: low back Pain Descriptors / Indicators: Aching Pain Intervention(s): Limited activity within patient's tolerance;Monitored during session    Home Living Family/patient expects to be discharged to:: Private residence Living Arrangements: Alone Available Help at Discharge: Personal care attendant Type of Home: Apartment Home Access: Stairs to enter Entrance Stairs-Rails: Right;Left;Can reach both(has back entrance with no steps) Entrance Stairs-Number of Steps: 3 Home Layout: One level Home Equipment: Walker - 4 wheels;Grab bars - tub/shower;Hand held shower head;Shower seat  Prior Function Level of Independence: Needs assistance   Gait / Transfers Assistance Needed: Ambulates household distances with Rollator  ADL's / Homemaking Assistance Needed: has home aides 8 hours/day x 5 days/week        Hand Dominance        Extremity/Trunk Assessment   Upper Extremity Assessment Upper Extremity  Assessment: Generalized weakness    Lower Extremity Assessment Lower Extremity Assessment: Generalized weakness    Cervical / Trunk Assessment Cervical / Trunk Assessment: Normal  Communication   Communication: No difficulties  Cognition Arousal/Alertness: Awake/alert Behavior During Therapy: WFL for tasks assessed/performed Overall Cognitive Status: Within Functional Limits for tasks assessed                                        General Comments      Exercises     Assessment/Plan    PT Assessment Patient needs continued PT services  PT Problem List Decreased strength;Decreased activity tolerance;Decreased balance;Decreased mobility       PT Treatment Interventions Gait training;Stair training;Functional mobility training;Therapeutic activities;Therapeutic exercise;Patient/family education    PT Goals (Current goals can be found in the Care Plan section)  Acute Rehab PT Goals Patient Stated Goal: return home PT Goal Formulation: With patient Time For Goal Achievement: 05/20/17 Potential to Achieve Goals: Good    Frequency Min 3X/week   Barriers to discharge        Co-evaluation               AM-PAC PT "6 Clicks" Daily Activity  Outcome Measure Difficulty turning over in bed (including adjusting bedclothes, sheets and blankets)?: None Difficulty moving from lying on back to sitting on the side of the bed? : None Difficulty sitting down on and standing up from a chair with arms (e.g., wheelchair, bedside commode, etc,.)?: None Help needed moving to and from a bed to chair (including a wheelchair)?: A Little Help needed walking in hospital room?: A Little Help needed climbing 3-5 steps with a railing? : A Little 6 Click Score: 21    End of Session Equipment Utilized During Treatment: Gait belt Activity Tolerance: Patient tolerated treatment well;Patient limited by fatigue Patient left: in bed;with call bell/phone within reach(seated at  bedside) Nurse Communication: Mobility status;Other (comment)(RN notifed patient can ambulate in hallway and left sitting up at bedside) PT Visit Diagnosis: Unsteadiness on feet (R26.81);Other abnormalities of gait and mobility (R26.89);Muscle weakness (generalized) (M62.81)    Time: 1521-1550 PT Time Calculation (min) (ACUTE ONLY): 29 min   Charges:   PT Evaluation $PT Eval Moderate Complexity: 1 Mod PT Treatments $Therapeutic Activity: 23-37 mins   PT G Codes:        4:02 PM, 05/16/17 Ocie BobJames Leotha Westermeyer, MPT Physical Therapist with [facility] [phone] office [phone] mobile phone

## 2017-05-16 NOTE — Progress Notes (Signed)
*  PRELIMINARY RESULTS* Echocardiogram 2D Echocardiogram has been performed.  Jeryl Columbialliott, Jax Abdelrahman 05/16/2017, 1:55 PM

## 2017-05-16 NOTE — Consult Note (Signed)
Cardiology Consultation:   Patient ID: Julie Burns; [id number]; [birth year]   Admit date: 05/15/2017 Date of Consult: 05/16/2017  Primary Care Provider: Joette Catching, MD Primary Cardiologist: Dr. Rollene Rotunda   Patient Profile:   Julie Burns is an 84 y.o. female with a history of bioprosthetic AVR in 2010, PAF on anticoagulation and amiodarone, chronic hypoxic respiratory failure with COPD, and CAD with previous multivessel PCI who is being seen today for the evaluation of heart failure symptoms at the request of Dr. Arbutus Leas.  History of Present Illness:   Julie Burns is admitted to the hospital reporting worsening shortness of breath, leg swelling and increased abdominal girth over the last few weeks.  She uses oxygen for chronic respiratory failure with COPD, otherwise reports compliance with her medications.  Of note, she states that her Lasix was cut from 40 mg twice daily to once daily approximately 1 month ago.  She otherwise has had no progressive palpitations, no dizziness or syncope.  She also reports intermittent left shoulder discomfort, possibly anginal as she has some improvement with nitroglycerin.  She also had a mastectomy on the left side and has chronic discomfort and swelling in her left arm.  Today she ambulated in the hall with PT and had some recurring left shoulder discomfort, took nitroglycerin.  She still does not feel back to baseline in terms of her shortness of breath.  Past Medical History:  Diagnosis Date  . Age-related osteoporosis without current pathological fracture   . Anxiety disorder   . Atrial fibrillation (HCC)   . CAD (coronary artery disease)    12/2013-DES to RCA due to NSTEMI- staged PCI to LAD and CX (not ideal for CABG; Echocardiogram 01/16/10 EF 55%, severe LVH; Moderate AS; mild ot moderate TR  . Chronic kidney disease   . Chronic respiratory failure with hypoxia (HCC)   . COPD (chronic obstructive pulmonary disease) (HCC)   .  Degenerative myopia with macular hole   . Diabetes (HCC)    TYPE 2  . Hyperlipidemia   . Hypothyroidism   . Irritable bowel syndrome without diarrhea   . NSTEMI (non-ST elevated myocardial infarction) (HCC)    NSTEMI 3/05; s/p PTCA with stenting of left circumflex coronary artery 3/05; totally occluded small vessel OM  . Occlusion and stenosis of right carotid artery    Carotid Duplex 7/13 moderate disease (<70%) of the right internal carotid arteries, this is a high bifurcation, s/p left CEA without significant stenosis.  . Polyp of colon   . Presence of prosthetic heart valve    S/P aortic valve replacement 04/29/2008   . PVD (peripheral vascular disease) (HCC)   . Sicca syndrome (HCC)   . Stroke (cerebrum) (HCC)   . Unspecified abdominal hernia without obstruction or gangrene   . Vitamin D deficiency     Past Surgical History:  Procedure Laterality Date  . AORTIC VALVE REPLACEMENT     S/P aortic valve replacement 04/29/2008   . CARDIAC CATHETERIZATION     by Flonnie Hailstone at [facility]; mLAD 30%, patent LCx stent, mRCA 50%, LVEDP 10, severe AS with AV mean gradient 46 mmHg, Right heart catheterization   . CAROTID ENDARTERECTOMY     Carotid Duplex 7/13 moderate disease (<70%) of the right internal carotid arteries, this is a high bifurcation, s/p left CEA without significant stenosis.     Inpatient Medications: Scheduled Meds: . amiodarone  200 mg Oral Daily  . apixaban  5 mg Oral BID  . aspirin  EC  81 mg Oral Daily  . budesonide  0.5 mg Nebulization BID  . cholecalciferol  5,000 Units Oral Daily  . doxazosin  2 mg Oral QHS  . furosemide  40 mg Intravenous Q12H  . gabapentin  100 mg Oral QHS  . levothyroxine  112.5 mcg Oral Once per day on Mon Thu  . [START ON 05/17/2017] levothyroxine  75 mcg Oral Once per day on Sun Tue Wed Fri Sat  . lisinopril  20 mg Oral Daily  . mouth rinse  15 mL Mouth Rinse BID  . metoprolol succinate  50 mg Oral Daily  . polyethylene glycol  17 g Oral  Daily  . sodium chloride flush  3 mL Intravenous Q12H   Continuous Infusions: . sodium chloride     PRN Meds: sodium chloride, acetaminophen, benzonatate, fluticasone, ipratropium-albuterol, LORazepam, magic mouthwash, nitroGLYCERIN, ondansetron (ZOFRAN) IV, ondansetron, promethazine, sodium chloride flush  Allergies:    Allergies  Allergen Reactions  . Bee Venom Swelling  . Codeine Hives  . Morphine And Related Other (See Comments)    Patient states she "felt weird".  . Norvasc [Amlodipine Besylate] Swelling  . Sulfa Antibiotics   . Valium [Diazepam]   . Zetia [Ezetimibe]   . Penicillins Hives and Rash    Has patient had a PCN reaction causing immediate rash, facial/tongue/throat swelling, SOB or lightheadedness with hypotension: yes Has patient had a PCN reaction causing severe rash involving mucus membranes or skin necrosis: Unknown Has patient had a PCN reaction that required hospitalization: No Has patient had a PCN reaction occurring within the last 10 years: No If all of the above answers are "NO", then may proceed with Cephalosporin use.     Social History:   Social History   Socioeconomic History  . Marital status: Widowed    Spouse name: Not on file  . Number of children: Not on file  . Years of education: Not on file  . Highest education level: Not on file  Social Needs  . Financial resource strain: Not on file  . Food insecurity - worry: Not on file  . Food insecurity - inability: Not on file  . Transportation needs - medical: Not on file  . Transportation needs - non-medical: Not on file  Occupational History  . Not on file  Tobacco Use  . Smoking status: Former Smoker    Packs/day: 3.00    Types: Cigarettes    Last attempt to quit: 10/23/1976    Years since quitting: 40.5  . Smokeless tobacco: Never Used  Substance and Sexual Activity  . Alcohol use: No  . Drug use: No  . Sexual activity: No  Other Topics Concern  . Not on file  Social History  Narrative  . Not on file    Family History:   The patient's family history includes CAD in her brother, father, mother, and sister; COPD in her son; Diabetes in her brother and brother; Hypertension in her brother, brother, father, mother, and sister.  ROS:  Please see the history of present illness.  Shortness of breath and leg swelling, all other ROS reviewed and negative.     Physical Exam/Data:   Vitals:   05/16/17 1010 05/16/17 1016 05/16/17 1115 05/16/17 1413  BP:   (!) 110/42   Pulse:   66   Resp:   20   Temp:   98.9 F (37.2 C)   TempSrc:   Other (Comment)   SpO2: 93% 93% 90% 90%  Weight:  Height:        Intake/Output Summary (Last 24 hours) at 05/16/2017 1703 Last data filed at 05/16/2017 0700 Gross per 24 hour  Intake 246 ml  Output 1400 ml  Net -1154 ml   Filed Weights   05/15/17 1432 05/15/17 2126 05/16/17 0629  Weight: 230 lb (104.3 kg) 230 lb 9.6 oz (104.6 kg) 230 lb 6.1 oz (104.5 kg)   Body mass index is 37.18 kg/m.   Gen: Elderly woman, no distress. HEENT: Conjunctiva and lids normal, oropharynx clear. Neck: Supple, elevated JVP, no carotid bruits, no thyromegaly. Lungs: Decreased breath sounds at the bases right greater than left, scattered rhonchi and prolonged expiratory phase. Cardiac: Regular rate and rhythm, no S3, 2/6 systolic murmur. Abdomen: Soft, nontender, bowel sounds present, no guarding or rebound. Extremities: 2+ leg edema, distal pulses 1+. Skin: Warm and dry. Musculoskeletal: Mild kyphosis. Neuropsychiatric: Alert and oriented x3, affect grossly appropriate.  EKG:  I personally reviewed the tracing from 05/15/2017 which showed sinus rhythm with PVC, nonspecific ST changes.  Telemetry:  I personally reviewed telemetry which shows sinus rhythm.  Relevant CV Studies:  Carotid Dopplers 02/06/2017: Final Interpretation: Right Carotid: There is evidence in the right ICA of a 40-59% stenosis.  Left Carotid: There is evidence in the  left ICA of a 1-39% stenosis. Patent left       endartectomy site.  Vertebrals: Both vertebral arteries were patent with antegrade flow. Subclavians: Normal flow hemodynamics were seen in bilateral subclavian       arteries.  Laboratory Data:  Chemistry Recent Labs  Lab 05/15/17 1556 05/15/17 1600 05/16/17 0648  NA 140  --  139  K 4.2  --  5.0  CL 96*  --  98*  CO2 33*  --  28  GLUCOSE 106*  --  131*  BUN 24* 24* 20  CREATININE 1.01*  --  0.98  CALCIUM 8.7*  --  8.7*  GFRNONAA 50*  --  52*  GFRAA 58*  --  60*  ANIONGAP 11  --  13    Recent Labs  Lab 05/15/17 1556  PROT 6.8  ALBUMIN 3.4*  AST 19  ALT 10*  ALKPHOS 81  BILITOT 0.9   Hematology Recent Labs  Lab 05/15/17 1556  WBC 6.4  RBC 3.84*  HGB 10.4*  HCT 35.3*  MCV 91.9  MCH 27.1  MCHC 29.5*  RDW 14.5  PLT 213   Cardiac Enzymes Recent Labs  Lab 05/15/17 1600 05/15/17 1827 05/16/17 0024 05/16/17 0648  TROPONINI <0.03 <0.03 0.03* <0.03   No results for input(s): TROPIPOC in the last 168 hours.   Radiology/Studies:  Dg Chest 2 View  Result Date: 05/15/2017 CLINICAL DATA:  Increased sob x 1 week. Has been doing neb treatments but they are not really helping. O2 sats at 92-93% on 3L when she was brought in by EMS EXAM: CHEST - 2 VIEW COMPARISON:  Chest radiograph 05/21/2012, CT 05/23/2012 FINDINGS: Sternotomy wires overlie normal cardiac silhouette. There is a moderate RIGHT effusion which is increased from prior (05/21/2012). No focal consolidation. LEFT lung relatively clear. There is fine interstitial pattern. No aggressive osseous lesion. IMPRESSION: Chronic unilateral RIGHT pleural effusion is increased from comparison exam 5 years prior. Mild interstitial edema. Electronically Signed   By: Genevive BiStewart  Edmunds M.D.   On: 05/15/2017 16:12   [place] Chest (pleural Effusion)  Result Date: 05/16/2017 CLINICAL DATA:  Pleural effusion. EXAM: CHEST ULTRASOUND COMPARISON:  Chest x-ray 05/15/2017.  FINDINGS: Small right pleural effusions  noted. Pleural effusions not PICC enough for safe thoracentesis. Repeat evaluation can be obtained as needed. IMPRESSION: Small right pleural effusion.  Thoracentesis not performed. Electronically Signed   By: Maisie Fus  Register   On: 05/16/2017 15:12    Assessment and Plan:   1.  Presumed acute on chronic diastolic heart failure.  Follow-up echocardiogram has been ordered and is pending.  Right pleural effusion is too small for thoracentesis.  She is on IV Lasix and has diuresed 1100 cc so far.  Still not back to baseline.  This could be associated with reduction in Lasix dose about a month ago.  2.  Paroxysmal atrial fibrillation, maintaining sinus rhythm on amiodarone, Eliquis, and Toprol-XL.  3.  History of bioprosthetic AVR in 2010.  4.  Multivessel CAD status post previous PCI as outlined above.  No active angina at this time.  Troponin I levels argue against ACS.  5.  COPD with chronic hypoxic respiratory failure on oxygen.  Reviewed records and discussed symptoms with patient.  Continue IV Lasix for further diuresis.  I will follow-up on her echocardiogram results as well.  Decrease Toprol-XL to 25 mg daily, she states that she had reduced this dose as an outpatient.  Not entirely clear that further ischemic testing is necessary at this time.  We will follow with you.  Signed, Nona Dell, MD  05/16/2017 5:03 PM

## 2017-05-16 NOTE — Progress Notes (Signed)
CRITICAL VALUE ALERT  Critical Value:  Troponin 0.03  Date & Time Notied:  05/16/17 [id number]  Provider Notified: Robb Matarrtiz, MD  Orders Received/Actions taken: Dr. Robb Matarrtiz notified via text page. No new orders at this time.

## 2017-05-16 NOTE — Plan of Care (Signed)
  Acute Rehab PT Goals(only PT should resolve) Pt Will Go Supine/Side To Sit 05/16/2017 1603 - Progressing by Ocie BobWatkins, Minerva Bluett, PT Flowsheets Taken 05/16/2017 1603  Pt will go Supine/Side to Sit Independently Patient Will Transfer Sit To/From Stand 05/16/2017 1603 - Progressing by Ocie BobWatkins, Miata Culbreth, PT Flowsheets Taken 05/16/2017 1603  Patient will transfer sit to/from stand with modified independence Pt Will Transfer Bed To Chair/Chair To Bed 05/16/2017 1603 - Progressing by Ocie BobWatkins, Sarabelle Genson, PT Flowsheets Taken 05/16/2017 1603  Pt will Transfer Bed to Chair/Chair to Bed with modified independence Pt Will Ambulate 05/16/2017 1603 - Progressing by Ocie BobWatkins, Johnel Yielding, PT Flowsheets Taken 05/16/2017 1603  Pt will Ambulate 75 feet;with modified independence;with rolling walker   4:03 PM, 05/16/17 Ocie BobJames Hendel Gatliff, MPT Physical Therapist [facility] 336 [phone] office [phone] mobile phone

## 2017-05-16 NOTE — Progress Notes (Signed)
Present with Ms Julie Burns for emotional and spiritual support. She shared about the deaths of 4 out of her 5 children. She is hopeful and appears to have good support. We prayed for good health and for the needs of her family.

## 2017-05-16 NOTE — Progress Notes (Signed)
C/O left arm discomfort and said her MD told her this could be heart related or from mastectomy. Says she experiences this about once a week and takes nitro which her MD said to do.  Gave nitro and she said it brought relief this time.  Vitals stable with NSR on monitor.  Texted Dr. Arbutus Leasat with this information

## 2017-05-16 NOTE — Progress Notes (Signed)
PT Cancellation Note  Patient Details Name: Julie LunaMary H Hailu MRN: [id number] DOB: 01-27-1931   Cancelled Treatment:    Reason Eval/Treat Not Completed: Medical issues which prohibited therapy(pt reporting L shoulder pain; she is unsure if it is from her previous surgery or if it is coming from her chest and she was requesting a nitroglycerin. PT notified RN. PT holding eval for now and will check back later at a different time/day.)    Jac CanavanBrooke Lucah Petta PT, DPT

## 2017-05-16 NOTE — Care Management Note (Addendum)
Case Management Note  Patient Details  Name: Julie Burns MRN: [id number] Date of Birth: Jul 17, 1930  Subjective/Objective:   Adm with CHF. From home alone, has an aide M-F hours a day. Walks with RW. Has oxygen with [employer]. Reports she weighs "most days". Has a daughter in [place], [place] in [place]. Has PCP, aide takes her to appointments.                  Action/Plan: DC home. Will follow for needs. ADDENDUM: HH PT recommended. Patient would like [facility]. [facility].    Expected Discharge Date:   05/19/2017               Expected Discharge Plan:     In-House Referral:     Discharge planning Services  CM Consult  Post Acute Care Choice:    Choice offered to:     DME Arranged:    DME Agency:     HH Arranged:   HH PT HH Agency:   [facility]  Status of Service:  In process, will continue to follow  If discussed at Long Length of Stay Meetings, dates discussed:    Additional Comments:  Onnika Siebel, Chrystine OilerSharley Diane, RN 05/16/2017, 11:37 AM

## 2017-05-17 DIAGNOSIS — I480 Paroxysmal atrial fibrillation: Secondary | ICD-10-CM

## 2017-05-17 DIAGNOSIS — I5031 Acute diastolic (congestive) heart failure: Secondary | ICD-10-CM

## 2017-05-17 DIAGNOSIS — J438 Other emphysema: Secondary | ICD-10-CM

## 2017-05-17 LAB — COMPREHENSIVE METABOLIC PANEL
ALBUMIN: 3.2 g/dL — AB (ref 3.5–5.0)
ALK PHOS: 80 U/L (ref 38–126)
ALT: 9 U/L — ABNORMAL LOW (ref 14–54)
AST: 12 U/L — AB (ref 15–41)
Anion gap: 10 (ref 5–15)
BUN: 20 mg/dL (ref 6–20)
CO2: 37 mmol/L — ABNORMAL HIGH (ref 22–32)
Calcium: 8.6 mg/dL — ABNORMAL LOW (ref 8.9–10.3)
Chloride: 95 mmol/L — ABNORMAL LOW (ref 101–111)
Creatinine, Ser: 0.87 mg/dL (ref 0.44–1.00)
GFR calc Af Amer: >60 mL/min (ref >60)
GFR, EST NON AFRICAN AMERICAN: 59 mL/min — AB (ref >60)
GLUCOSE: 125 mg/dL — AB (ref 65–99)
POTASSIUM: 4.1 mmol/L (ref 3.5–5.1)
Sodium: 142 mmol/L (ref 135–145)
TOTAL PROTEIN: 6.4 g/dL — AB (ref 6.5–8.1)
Total Bilirubin: 1.1 mg/dL (ref 0.3–1.2)

## 2017-05-17 LAB — MAGNESIUM: Magnesium: 2 mg/dL (ref 1.7–2.4)

## 2017-05-17 LAB — LACTATE DEHYDROGENASE: LDH: 128 U/L (ref 98–192)

## 2017-05-17 MED ORDER — IPRATROPIUM-ALBUTEROL 0.5-2.5 (3) MG/3ML IN SOLN
3.0000 mL | Freq: Four times a day (QID) | RESPIRATORY_TRACT | Status: DC
  Administered 2017-05-18 – 2017-05-20 (×10): 3 mL via RESPIRATORY_TRACT
  Filled 2017-05-17 (×11): qty 3

## 2017-05-17 MED ORDER — LISINOPRIL 5 MG PO TABS
5.0000 mg | ORAL_TABLET | Freq: Every day | ORAL | Status: DC
  Administered 2017-05-19 – 2017-05-21 (×3): 5 mg via ORAL
  Filled 2017-05-17 (×4): qty 1

## 2017-05-17 MED ORDER — IPRATROPIUM-ALBUTEROL 0.5-2.5 (3) MG/3ML IN SOLN
3.0000 mL | RESPIRATORY_TRACT | Status: DC | PRN
  Administered 2017-05-20: 3 mL via RESPIRATORY_TRACT

## 2017-05-17 NOTE — Progress Notes (Addendum)
Progress Note  Patient Name: Julie Burns Date of Encounter: 05/17/2017  Primary Cardiologist: Rollene Rotunda, MD   Subjective   Breathing improved but not yet at baseline. Required a breathing treatment overnight. Denies any chest pain or palpitations.  Inpatient Medications    Scheduled Meds: . amiodarone  200 mg Oral Daily  . apixaban  5 mg Oral BID  . aspirin EC  81 mg Oral Daily  . budesonide  0.5 mg Nebulization BID  . cholecalciferol  5,000 Units Oral Daily  . doxazosin  2 mg Oral QHS  . furosemide  40 mg Intravenous Q12H  . gabapentin  100 mg Oral QHS  . levothyroxine  112.5 mcg Oral Once per day on Mon Thu  . levothyroxine  75 mcg Oral Once per day on Sun Tue Wed Fri Sat  . lisinopril  20 mg Oral Daily  . mouth rinse  15 mL Mouth Rinse BID  . metoprolol succinate  25 mg Oral Daily  . polyethylene glycol  17 g Oral Daily  . sodium chloride flush  3 mL Intravenous Q12H   Continuous Infusions: . sodium chloride     PRN Meds: sodium chloride, acetaminophen, benzonatate, fluticasone, ipratropium-albuterol, LORazepam, magic mouthwash, nitroGLYCERIN, ondansetron (ZOFRAN) IV, ondansetron, promethazine, sodium chloride flush   Vital Signs    Vitals:   05/16/17 1954 05/16/17 2109 05/17/17 0447 05/17/17 0605  BP:  110/74  101/64  Pulse:  68  72  Resp:  20  20  Temp:  98.5 F (36.9 C)  98.3 F (36.8 C)  TempSrc:  Oral  Oral  SpO2: 91% 93% 90% 93%  Weight:    230 lb 9.6 oz (104.6 kg)  Height:        Intake/Output Summary (Last 24 hours) at 05/17/2017 0727 Last data filed at 05/17/2017 0606 Gross per 24 hour  Intake 846 ml  Output 1900 ml  Net -1054 ml   Filed Weights   05/15/17 2126 05/16/17 0629 05/17/17 0605  Weight: 230 lb 9.6 oz (104.6 kg) 230 lb 6.1 oz (104.5 kg) 230 lb 9.6 oz (104.6 kg)    Telemetry    NSR, HR in mid-50's to 70's. 9 beat run of NSVT overnight.   - Personally Reviewed  ECG    NSR, HR 60, with PVC's and nonspecific ST  abnormalities.  - Personally Reviewed  Physical Exam   General: Well developed, elderly Caucasian female appearing in no acute distress. Head: Normocephalic, atraumatic.  Neck: Supple without bruits, JVD at 9cm. Lungs:  Resp regular and unlabored, decreased breath sounds along right base. Heart: RRR, S1, S2, no S3, S4, 2/6 SEM along RUSB.  Abdomen: Soft, non-tender, non-distended with normoactive bowel sounds. No hepatomegaly. No rebound/guarding. No obvious abdominal masses. Extremities: No clubbing or cyanosis, 1+ pitting edema bilaterally. Distal pedal pulses are 2+ bilaterally. Neuro: Alert and oriented X 3. Moves all extremities spontaneously. Psych: Normal affect.  Labs    Chemistry Recent Labs  Lab 05/15/17 1556 05/15/17 1600 05/16/17 0648 05/17/17 0451  NA 140  --  139 142  K 4.2  --  5.0 4.1  CL 96*  --  98* 95*  CO2 33*  --  28 37*  GLUCOSE 106*  --  131* 125*  BUN 24* 24* 20 20  CREATININE 1.01*  --  0.98 0.87  CALCIUM 8.7*  --  8.7* 8.6*  PROT 6.8  --   --  6.4*  ALBUMIN 3.4*  --   --  3.2*  AST 19  --   --  12*  ALT 10*  --   --  9*  ALKPHOS 81  --   --  80  BILITOT 0.9  --   --  1.1  GFRNONAA 50*  --  52* 59*  GFRAA 58*  --  60* >60  ANIONGAP 11  --  13 10     Hematology Recent Labs  Lab 05/15/17 1556  WBC 6.4  RBC 3.84*  HGB 10.4*  HCT 35.3*  MCV 91.9  MCH 27.1  MCHC 29.5*  RDW 14.5  PLT 213    Cardiac Enzymes Recent Labs  Lab 05/15/17 1600 05/15/17 1827 05/16/17 0024 05/16/17 0648  TROPONINI <0.03 <0.03 0.03* <0.03   No results for input(s): TROPIPOC in the last 168 hours.   BNPNo results for input(s): BNP, PROBNP in the last 168 hours.   DDimer No results for input(s): DDIMER in the last 168 hours.   Radiology    Dg Chest 2 View  Result Date: 05/15/2017 CLINICAL DATA:  Increased sob x 1 week. Has been doing neb treatments but they are not really helping. O2 sats at 92-93% on 3L when she was brought in by EMS EXAM: CHEST - 2  VIEW COMPARISON:  Chest radiograph 05/21/2012, CT 05/23/2012 FINDINGS: Sternotomy wires overlie normal cardiac silhouette. There is a moderate RIGHT effusion which is increased from prior (05/21/2012). No focal consolidation. LEFT lung relatively clear. There is fine interstitial pattern. No aggressive osseous lesion. IMPRESSION: Chronic unilateral RIGHT pleural effusion is increased from comparison exam 5 years prior. Mild interstitial edema. Electronically Signed   By: Genevive Bi M.D.   On: 05/15/2017 16:12   [place] Chest (pleural Effusion)  Result Date: 05/16/2017 CLINICAL DATA:  Pleural effusion. EXAM: CHEST ULTRASOUND COMPARISON:  Chest x-ray 05/15/2017. FINDINGS: Small right pleural effusions noted. Pleural effusions not PICC enough for safe thoracentesis. Repeat evaluation can be obtained as needed. IMPRESSION: Small right pleural effusion.  Thoracentesis not performed. Electronically Signed   By: Maisie Fus  Register   On: 05/16/2017 15:12    Cardiac Studies   Echocardiogram: 05/08/2017 Study Conclusions  - Left ventricle: The cavity size was normal. Wall thickness was   increased in a pattern of mild LVH. Systolic function was   vigorous. The estimated ejection fraction was in the range of 65%   to 70%. Wall motion was normal; there were no regional wall   motion abnormalities. The study is not technically sufficient to   allow evaluation of LV diastolic function. - Aortic valve: Poorly visualized. A porcine bioprosthesis was   present. There was trivial regurgitation. Mean gradient (S): 19   mm Hg. - Mitral valve: Mildly calcified annulus. Mildly thickened,   moderately calcified leaflets . The findings are consistent with   mild stenosis. There was trivial regurgitation. Mean gradient   (D): 6 mm Hg. Valve area by pressure half-time: 1.61 cm^2. - Left atrium: The atrium was mildly dilated. - Right ventricle: The cavity size was mildly dilated. - Right atrium: Central venous  pressure (est): 8 mm Hg. - Tricuspid valve: There was mild regurgitation. - Pulmonary arteries: Systolic pressure was moderately increased.   PA peak pressure: 48 mm Hg (S). - Pericardium, extracardiac: There was no pericardial effusion.   Patient Profile     84 y.o. female w/ PMH of bioprosthetic AVR in 2010, CAD (s/p DES to RCA and staged PCI to LAD and LCx in 2015), PAF (s/p DCCV in 2017), carotid artery stenosis and  chronic hypoxic respiratory failure who presented to [facility] ED on 05/15/2017 for evaluation of worsening dyspnea and lower extremity edema.   Assessment & Plan    1. Acute on Chronic Diastolic CHF - Presented with worsening dyspnea on exertion and lower extremity edema. CXR showed a chronic unilateral right pleural effusion which was increased from prior examination with mild interstitial edema. - Echocardiogram obtained and showed a preserved EF of 65-70% with no regional wall motion abnormalities. - She has been started on IV Lasix 40 mg twice daily and is overall -2.2 L thus far this admission.  Weight recorded as been stable at 230 lbs. Creatinine at 0.87. Would continue with IV Lasix at current dosing. Continue to follow I's and O's along with daily weights.  2. CAD - s/p s/p DES to RCA and staged PCI to LAD and LCx in 2015. - she denies any recent chest pain.  - cyclic troponin values have been flat at < 0.03 and 0.03 with EKG showing no acute ischemic changes.  - continue BB therapy. Not on ASA secondary to the need for Eliquis. Intolerant to statins and Zetia in the past.   3. Aortic Valve Disease - s/p bioprosthetic AVR in 2010. - repeat echo this admission shows trivial regurgitation with a mean gradient of 19 mm Hg.  4. Chronic Hypoxic Respiratory Failure  - on 3L [place] at baseline.    For questions or updates, please contact [facility] Please consult [email or web address] for contact info under Cardiology/STEMI.   Lorri FrederickSigned, Julie Burns , PA-C 7:27  AM 05/17/2017 Pager: [phone]   Attending note:   Patient seen and examined.  Discussed case with Ms. Iran OuchStrader PA-C.  Julie Burns states that her breathing is gradually improving, still does not look to be back to baseline.  She did some ambulation yesterday.  She continues to diurese on IV Lasix, 2200 cc out more than and so far.  On examination this morning she is lying nearly supine in bed.  Blood pressure is adequately controlled with heart rates in the 60s in sinus rhythm by telemetry which I personally reviewed.  Lungs exhibit decreased breath sounds at the bases, particular on the right, scattered rhonchi.  Cardiac exam with RRR and 2/6 systolic murmur at the base.  Lab work shows stable renal function with creatinine 0.87, potassium 4.1.  Follow-up echocardiogram from yesterday showed LVEF 65-70% with bioprosthetic AVR in position, mean gradient 19 mmHg and only trivial aortic regurgitation.  PASP was 48 mmHg.  Would continue IV Lasix for treatment of acute on chronic diastolic heart failure.  Ambulate today.  She might be considered for discharge tomorrow if continues to improve.  Would recommend going back to Lasix 40 mg twice daily as an outpatient instead of once daily.  Otherwise continue Eliquis, amiodarone, lisinopril, and Toprol-XL at 25 mg daily.  Jonelle SidleSamuel G. Elvi Leventhal, M.D., F.A.C.C.

## 2017-05-17 NOTE — Progress Notes (Signed)
PROGRESS NOTE  Julie Burns:[id number] DOB: 13-Sep-1930 DOA: 05/15/2017 PCP: Joette Catching, MD  Brief History:  84 year old female with a history of atrial fibrillation, coronary disease, CKD, chronic respiratory failure on 3 L, COPD, diabetes mellitus type 2, hyperlipidemia, hypothyroidism presenting with 1 week history of worsening shortness of breath.  The patient has been having dyspnea on minimal exertion.  She has also been complaining worsening lower extremity edema.  The patient is also complaining of intermittent left shoulder pain.  She feels this may be related to angina.  She takes a nitroglycerin which she states helps the pain.  She states that her primary care provider, Dr. Denzil Magnuson decreased her furosemide from 40 mg twice daily to once daily approximately 1 month prior to this admission.  Upon EMS arrival, the patient had oxygen saturation of 92% on 3 L. She had AVR in 2010 (21mm Magna valve) and PAF on anticoagulation and amiodarone.  Upon presentation, the patient was noted to have an increased right pleural effusion on chest x-ray with interstitial edema.  She was started on intravenous furosemide with good clinical results.     Assessment/Plan: Acute Diastolic CHF -daily weights--stable -accurate I/O's--NEG 2.5L -fluid restrict -Echo--EF 65-70%, no WMA, PASP 49, mild TR, trivial MR -continue IV Lasix -Continue metoprolol succinate  Left shoulder pain -Concerned about anginal equivalent -Cardiology consult appreciated--ischemic testing not needed at present time -Relieved with nitroglycerin  Atrial fibrillation, type unspecified -Continue apixaban -Continue amiodarone -Remains in sinus rhythm  Coronary artery disease -No chest pain presently -DES to the RCA staged PCI to the LAD and circumflex in 2015. She was thought not to be a CABG candidate  Bioprosthetic AVR -Placed in 2010 -Echocardiogram as above  Acute on Chronic respiratory  failure with hypoxia -Multifactorial including pulmonary edema, COPD, deconditioning, OSA/OHS -She has chronic dyspnea on 3 L at baseline -She gets around with a walker -She has very little functional reserve and becomes dyspneic with minimal exertion with oxygen desaturation into 80s  Right pleural effusion -Request thoracocentesis--US chest showed effusion too small to tap  Essential hypertension -Continue metoprolol succinate, lisinopril -d/c cardura -decrease metoprolol to 25 mg and lisinopril to 5 mg daily  COPD -Compensated presently -Continue Pulmicort   Hypothyroidism -Continue Synthroid  Carotid stenosis -Status post left CEA -She has a moderate right ICA stenosis -Due for repeat carotid ultrasound December 2019       Disposition Plan:   Home in 1-2 days  Family Communication:   Daughter updated at bedside 3/15   Consultants:  cardiology  Code Status:  FULL   DVT Prophylaxis:  apixaban   Procedures: As Listed in Progress Note Above  Antibiotics: None    Subjective: Patient has shortness of breath with minimal exertion just going to the bathroom.  She denies any chest pain, nausea, vomiting, diarrhea, abdominal pain, dysuria, hematuria.  She denies any fevers, chills, headache.  Objective: Vitals:   05/16/17 2109 05/17/17 0447 05/17/17 0605 05/17/17 0808  BP: 110/74  101/64   Pulse: 68  72   Resp: 20  20   Temp: 98.5 F (36.9 C)  98.3 F (36.8 C)   TempSrc: Oral  Oral   SpO2: 93% 90% 93% (!) 89%  Weight:   104.6 kg (230 lb 9.6 oz)   Height:        Intake/Output Summary (Last 24 hours) at 05/17/2017 1427 Last data filed at 05/17/2017 0606 Gross per 24 hour  Intake 366 ml  Output 2200 ml  Net -1834 ml   Weight change: 0.273 kg (9.6 oz) Exam:   General:  Pt is alert, follows commands appropriately, not in acute distress  HEENT: No icterus, No thrush, No neck mass, [place]/AT  Cardiovascular: IRRR, S1/S2, no rubs, no  gallops  Respiratory: Diminished breath sounds bilateral.  Bilateral crackles.  No wheezing.  Good air movement.  Abdomen: Soft/+BS, non tender, non distended, no guarding  Extremities: 2 + LE edema, No lymphangitis, No petechiae, No rashes, no synovitis   Data Reviewed: I have personally reviewed following labs and imaging studies Basic Metabolic Panel: Recent Labs  Lab 05/15/17 1556 05/15/17 1600 05/16/17 0648 05/17/17 0451  NA 140  --  139 142  K 4.2  --  5.0 4.1  CL 96*  --  98* 95*  CO2 33*  --  28 37*  GLUCOSE 106*  --  131* 125*  BUN 24* 24* 20 20  CREATININE 1.01*  --  0.98 0.87  CALCIUM 8.7*  --  8.7* 8.6*  MG  --   --   --  2.0   Liver Function Tests: Recent Labs  Lab 05/15/17 1556 05/17/17 0451  AST 19 12*  ALT 10* 9*  ALKPHOS 81 80  BILITOT 0.9 1.1  PROT 6.8 6.4*  ALBUMIN 3.4* 3.2*   No results for input(s): LIPASE, AMYLASE in the last 168 hours. No results for input(s): AMMONIA in the last 168 hours. Coagulation Profile: No results for input(s): INR, PROTIME in the last 168 hours. CBC: Recent Labs  Lab 05/15/17 1556  WBC 6.4  NEUTROABS 4.7  HGB 10.4*  HCT 35.3*  MCV 91.9  PLT 213   Cardiac Enzymes: Recent Labs  Lab 05/15/17 1600 05/15/17 1827 05/16/17 0024 05/16/17 0648  TROPONINI <0.03 <0.03 0.03* <0.03   BNP: Invalid input(s): POCBNP CBG: No results for input(s): GLUCAP in the last 168 hours. HbA1C: No results for input(s): HGBA1C in the last 72 hours. Urine analysis: No results found for: COLORURINE, APPEARANCEUR, LABSPEC, PHURINE, GLUCOSEU, HGBUR, BILIRUBINUR, KETONESUR, PROTEINUR, UROBILINOGEN, NITRITE, LEUKOCYTESUR Sepsis Labs: @LABRCNTIP (procalcitonin:4,lacticidven:4) )No results found for this or any previous visit (from the past 240 hour(s)).   Scheduled Meds: . amiodarone  200 mg Oral Daily  . apixaban  5 mg Oral BID  . budesonide  0.5 mg Nebulization BID  . cholecalciferol  5,000 Units Oral Daily  . doxazosin  2  mg Oral QHS  . furosemide  40 mg Intravenous Q12H  . gabapentin  100 mg Oral QHS  . levothyroxine  112.5 mcg Oral Once per day on Mon Thu  . levothyroxine  75 mcg Oral Once per day on Sun Tue Wed Fri Sat  . lisinopril  20 mg Oral Daily  . mouth rinse  15 mL Mouth Rinse BID  . metoprolol succinate  25 mg Oral Daily  . polyethylene glycol  17 g Oral Daily  . sodium chloride flush  3 mL Intravenous Q12H   Continuous Infusions: . sodium chloride      Procedures/Studies: Dg Chest 2 View  Result Date: 05/15/2017 CLINICAL DATA:  Increased sob x 1 week. Has been doing neb treatments but they are not really helping. O2 sats at 92-93% on 3L when she was brought in by EMS EXAM: CHEST - 2 VIEW COMPARISON:  Chest radiograph 05/21/2012, CT 05/23/2012 FINDINGS: Sternotomy wires overlie normal cardiac silhouette. There is a moderate RIGHT effusion which is increased from prior (05/21/2012). No focal consolidation. LEFT  lung relatively clear. There is fine interstitial pattern. No aggressive osseous lesion. IMPRESSION: Chronic unilateral RIGHT pleural effusion is increased from comparison exam 5 years prior. Mild interstitial edema. Electronically Signed   By: Genevive BiStewart  Edmunds M.D.   On: 05/15/2017 16:12   [place] Chest (pleural Effusion)  Result Date: 05/16/2017 CLINICAL DATA:  Pleural effusion. EXAM: CHEST ULTRASOUND COMPARISON:  Chest x-ray 05/15/2017. FINDINGS: Small right pleural effusions noted. Pleural effusions not PICC enough for safe thoracentesis. Repeat evaluation can be obtained as needed. IMPRESSION: Small right pleural effusion.  Thoracentesis not performed. Electronically Signed   By: Maisie Fushomas  Register   On: 05/16/2017 15:12    Catarina Hartshornavid Shanesha Bednarz, DO  [facility] Pager [phone]  If 7PM-7AM, please contact night-coverage [email or web address] Password [email or web address] 05/17/2017, 2:27 PM   LOS: 2 days

## 2017-05-17 NOTE — Progress Notes (Signed)
Physical Therapy Treatment Patient Details Name: Julie Burns MRN: [id number] DOB: December 15, 1930 Today's Date: 05/17/2017    History of Present Illness Julie Burns is a 84 y.o. female with medical history significant of A. fib on Eliquis, congestive heart failure, status post aortic valve replacement in 2010, coronary artery disease status post stenting comes in with progressive shortness of breath for several weeks.  She also reports some lower extremity edema.  Patient reports her Lasix was decreased about a month ago by her cardiologist.  Since that time she feels like she has been gaining weight.  She denies any fevers or coughing.  She has been getting very dyspneic on exertion.  She chronically is on 2 L of oxygen at home and she is been requiring 3 recently.  Patient is referred for admission for worsening pleural effusion on the right.  Patient was previously on Lasix 40 mg p.o. twice a day this is recently been decreased to 40 mg daily.    PT Comments    Pt sitting on EOB at PTA entrance, friendly and willing to participate.  Began session with diaphragmatic breathing techniques to assist with O2 sat %.  Vitals monitored through session.  Gait training with RW and 4L of O2% used during gait to restroom.  Decreased O2% following restroom break and increased to L5 O2 assistance and increased to 93% prior return to bed.  Decreased gait distance this session due to increased difficulty keeping O2 level WNL.  Range from 71% while sitting on commode to 94% standing.  EOS pt talking to MD while sitting on EOB with call bell within reach and family member in room.  No reports of pain through session.    Follow Up Recommendations        Equipment Recommendations       Recommendations for Other Services       Precautions / Restrictions Precautions Precautions: Fall    Mobility  Bed Mobility               General bed mobility comments: Pt sitting EOB at PTA  entrance  Transfers Overall transfer level: Needs assistance Equipment used: Rolling walker (2 wheeled) Transfers: Sit to/from Stand Sit to Stand: Supervision         General transfer comment: Cueing for hand placement for safety/ease with STS.  Instructed diaphragmatic breathing to increase O2 Sat%  Ambulation/Gait Ambulation/Gait assistance: Supervision Ambulation Distance (Feet): 12 Feet Assistive device: Rolling walker (2 wheeled) Gait Pattern/deviations: Decreased step length - right;Decreased step length - left;Decreased stride length     General Gait Details: labored breathing, slow cadence no LOB.  On 4 LPM O2 to restroom, increased L5 LPB O2 following restroom break back to sitting on edge of bed.  Limited by SOB and fatigue   Stairs            Wheelchair Mobility    Modified Rankin (Stroke Patients Only)       Balance                                            Cognition                                              Exercises  General Comments        Pertinent Vitals/Pain Pain Assessment: No/denies pain    Home Living                      Prior Function            PT Goals (current goals can now be found in the care plan section)      Frequency           PT Plan Current plan remains appropriate    Co-evaluation              AM-PAC PT "6 Clicks" Daily Activity  Outcome Measure  Difficulty turning over in bed (including adjusting bedclothes, sheets and blankets)?: None Difficulty moving from lying on back to sitting on the side of the bed? : None Difficulty sitting down on and standing up from a chair with arms (e.g., wheelchair, bedside commode, etc,.)?: None Help needed moving to and from a bed to chair (including a wheelchair)?: A Little Help needed walking in hospital room?: A Little Help needed climbing 3-5 steps with a railing? : A Little 6 Click Score: 21    End  of Session Equipment Utilized During Treatment: Gait belt Activity Tolerance: Patient tolerated treatment well;Patient limited by fatigue Patient left: in bed;with call bell/phone within reach(sitting on EOB, talking to MD at EOS) Nurse Communication: Mobility status;Other (comment)(MD and family member in room at EOS, call bell within reach) PT Visit Diagnosis: Unsteadiness on feet (R26.81);Other abnormalities of gait and mobility (R26.89);Muscle weakness (generalized) (M62.81)     Time: [id number] PT Time Calculation (min) (ACUTE ONLY): 20 min  Charges:  $Therapeutic Activity: 8-22 mins                    G Codes:       Becky SaxCasey Dijon Kohlman, LPTA; CBIS [phone]  Juel BurrowCockerham, Delila Kuklinski Jo 05/17/2017, 3:10 PM

## 2017-05-18 ENCOUNTER — Inpatient Hospital Stay (HOSPITAL_COMMUNITY): Payer: Medicare Other

## 2017-05-18 DIAGNOSIS — J441 Chronic obstructive pulmonary disease with (acute) exacerbation: Secondary | ICD-10-CM

## 2017-05-18 LAB — BASIC METABOLIC PANEL
Anion gap: 12 (ref 5–15)
BUN: 23 mg/dL — AB (ref 6–20)
CHLORIDE: 95 mmol/L — AB (ref 101–111)
CO2: 35 mmol/L — AB (ref 22–32)
Calcium: 8.6 mg/dL — ABNORMAL LOW (ref 8.9–10.3)
Creatinine, Ser: 0.81 mg/dL (ref 0.44–1.00)
GFR calc Af Amer: >60 mL/min (ref >60)
GFR calc non Af Amer: >60 mL/min (ref >60)
GLUCOSE: 121 mg/dL — AB (ref 65–99)
Potassium: 3.8 mmol/L (ref 3.5–5.1)
Sodium: 142 mmol/L (ref 135–145)

## 2017-05-18 LAB — MAGNESIUM: Magnesium: 1.8 mg/dL (ref 1.7–2.4)

## 2017-05-18 LAB — PROCALCITONIN: Procalcitonin: <0.1 ng/mL

## 2017-05-18 LAB — BRAIN NATRIURETIC PEPTIDE: B Natriuretic Peptide: 420 pg/mL — ABNORMAL HIGH (ref 0.0–100.0)

## 2017-05-18 MED ORDER — METHYLPREDNISOLONE SODIUM SUCC 125 MG IJ SOLR
60.0000 mg | Freq: Four times a day (QID) | INTRAMUSCULAR | Status: DC
  Administered 2017-05-18 – 2017-05-19 (×3): 60 mg via INTRAVENOUS
  Filled 2017-05-18 (×4): qty 2

## 2017-05-18 MED ORDER — METHYLPREDNISOLONE SODIUM SUCC 125 MG IJ SOLR: 125.0000 mg | Freq: Once | INTRAMUSCULAR | Status: DC

## 2017-05-18 MED ORDER — FUROSEMIDE 10 MG/ML IJ SOLN
40.0000 mg | Freq: Three times a day (TID) | INTRAMUSCULAR | Status: DC
  Administered 2017-05-18 – 2017-05-19 (×2): 40 mg via INTRAVENOUS
  Filled 2017-05-18 (×3): qty 4

## 2017-05-18 MED ORDER — METHYLPREDNISOLONE SODIUM SUCC 125 MG IJ SOLR: 60.0000 mg | Freq: Four times a day (QID) | INTRAMUSCULAR | Status: DC

## 2017-05-18 NOTE — Progress Notes (Signed)
[phone].

## 2017-05-18 NOTE — Progress Notes (Signed)
PROGRESS NOTE  Julie Burns ZOX:[id number] DOB: [birth year] DOA: 05/15/2017 PCP: Joette Catching, MD  Brief History: 84 year old female with a history of atrial fibrillation, coronary disease, CKD, chronic respiratory failure on3L, COPD, diabetes mellitus type 2, hyperlipidemia, hypothyroidism presenting with 1 week history of worseningshortness of breath. The patient has been having dyspnea on minimal exertion. She has also been complaining worseninglower extremity edema.The patient is also complaining of intermittent left shoulder pain. She feels this may be related to angina. She takes a nitroglycerin which she states helps the pain. She states that her primary care provider, Dr. Denzil Magnuson decreased her furosemide from 40 mg twice daily to once daily approximately 1 month prior to this admission.Upon EMS arrival, the patient had oxygen saturation of 92% on 3 L.She had AVR in 2010 (21mm Magna valve) and PAF on anticoagulation and amiodarone.Upon presentation, the patient was noted to have an increased right pleural effusion on chest x-ray with interstitial edema. She was started on intravenous furosemide with good clinical results.     Assessment/Plan: Acute Diastolic CHF -daily weights--stable -accurate I/O's--not accurate -fluid restrict -Echo--EF 65-70%, no WMA, PASP 49, mild TR, trivial MR -Increase IV Lasix every 8 hours -Continue metoprolol succinate -3/16--personally reviewed CXR--bilateral pleural effusions.  Increased interstitial markings  COPD exacerbation -Start IV steroids -Start Pulmicort -Continue duo nebs  Left shoulder pain -Concerned about anginal equivalent -Cardiology consult appreciated--ischemic testing not needed at present time -Relieved with nitroglycerin  Atrial fibrillation, type unspecified -Continue apixaban -Continue amiodarone -Remains in sinus rhythm  Coronary artery disease -No chest pain presently -DES to the RCA  staged PCI to the LAD and circumflex in 2015. She was thought not to be a CABG candidate  Bioprosthetic AVR -Placed in 2010 -Echocardiogram as above  Acute on Chronic respiratory failure with hypoxia -Multifactorial including pulmonary edema, COPD, deconditioning, OSA/OHS -She has chronic dyspnea on3L at baseline -She gets around with a walker -She has very little functional reserve and becomes dyspneic with minimal exertion with oxygen desaturation into 80s  Right pleural effusion -Request thoracocentesis--US chest showed effusion too small to tap  Essential hypertension -Continue metoprolol succinate, lisinopril -d/c cardura -decrease metoprolol to 25 mg and lisinopril to 5 mg daily  COPD -Compensated presently -Continue Pulmicort   Hypothyroidism -Continue Synthroid  Carotid stenosis -Status post left CEA -She has a moderate right ICA stenosis -Due for repeat carotid ultrasound December 2019  Goals of Care -long discussion with pt and daughter-->change to DNR      Disposition Plan: Home in 2-3 days  Family Communication:Daughter updated at bedside 3/16   Consultants:cardiology  Code Status: FULL   DVT Prophylaxis:apixaban   Procedures: As Listed in Progress Note Above  Antibiotics: None     Subjective: Patient is more short of breath today.  She denies any chest pain, nausea, vomiting, diarrhea, abdominal pain.  Denies any headache fevers or chills.  She is having trouble lying flat.  Objective: Vitals:   05/18/17 1024 05/18/17 1026 05/18/17 1330 05/18/17 1605  BP:  (!) 144/42 115/77   Pulse:  72 63   Resp:  20 18   Temp:   98.3 F (36.8 C)   TempSrc:   Oral   SpO2: (!) 87% 91% 91% 90%  Weight:      Height:        Intake/Output Summary (Last 24 hours) at 05/18/2017 1721 Last data filed at 05/18/2017 1012 Gross per 24 hour  Intake 240 ml  Output 250 ml  Net -10 ml   Weight change:  Exam:   General:   Pt is alert, follows commands appropriately, not in acute distress  HEENT: No icterus, No thrush, No neck mass, [place]/AT  Cardiovascular: RRR, S1/S2, no rubs, no gallops  Respiratory: Bilateral crackles.  Bibasilar expiratory wheeze.  Good air movement.  Abdomen: Soft/+BS, non tender, non distended, no guarding  Extremities: 2+ LE edema, No lymphangitis, No petechiae, No rashes, no synovitis   Data Reviewed: I have personally reviewed following labs and imaging studies Basic Metabolic Panel: Recent Labs  Lab 05/15/17 1556 05/15/17 1600 05/16/17 0648 05/17/17 0451 05/18/17 0624  NA 140  --  139 142 142  K 4.2  --  5.0 4.1 3.8  CL 96*  --  98* 95* 95*  CO2 33*  --  28 37* 35*  GLUCOSE 106*  --  131* 125* 121*  BUN 24* 24* 20 20 23*  CREATININE 1.01*  --  0.98 0.87 0.81  CALCIUM 8.7*  --  8.7* 8.6* 8.6*  MG  --   --   --  2.0 1.8   Liver Function Tests: Recent Labs  Lab 05/15/17 1556 05/17/17 0451  AST 19 12*  ALT 10* 9*  ALKPHOS 81 80  BILITOT 0.9 1.1  PROT 6.8 6.4*  ALBUMIN 3.4* 3.2*   No results for input(s): LIPASE, AMYLASE in the last 168 hours. No results for input(s): AMMONIA in the last 168 hours. Coagulation Profile: No results for input(s): INR, PROTIME in the last 168 hours. CBC: Recent Labs  Lab 05/15/17 1556  WBC 6.4  NEUTROABS 4.7  HGB 10.4*  HCT 35.3*  MCV 91.9  PLT 213   Cardiac Enzymes: Recent Labs  Lab 05/15/17 1600 05/15/17 1827 05/16/17 0024 05/16/17 0648  TROPONINI <0.03 <0.03 0.03* <0.03   BNP: Invalid input(s): POCBNP CBG: No results for input(s): GLUCAP in the last 168 hours. HbA1C: No results for input(s): HGBA1C in the last 72 hours. Urine analysis: No results found for: COLORURINE, APPEARANCEUR, LABSPEC, PHURINE, GLUCOSEU, HGBUR, BILIRUBINUR, KETONESUR, PROTEINUR, UROBILINOGEN, NITRITE, LEUKOCYTESUR Sepsis Labs: @LABRCNTIP (procalcitonin:4,lacticidven:4) )No results found for this or any previous visit (from the past  240 hour(s)).   Scheduled Meds: . amiodarone  200 mg Oral Daily  . apixaban  5 mg Oral BID  . budesonide  0.5 mg Nebulization BID  . cholecalciferol  5,000 Units Oral Daily  . furosemide  40 mg Intravenous Q12H  . gabapentin  100 mg Oral QHS  . ipratropium-albuterol  3 mL Nebulization Q6H  . levothyroxine  112.5 mcg Oral Once per day on Mon Thu  . levothyroxine  75 mcg Oral Once per day on Sun Tue Wed Fri Sat  . lisinopril  5 mg Oral Daily  . mouth rinse  15 mL Mouth Rinse BID  . methylPREDNISolone (SOLU-MEDROL) injection  125 mg Intravenous Once  . methylPREDNISolone (SOLU-MEDROL) injection  60 mg Intravenous Q6H  . metoprolol succinate  25 mg Oral Daily  . polyethylene glycol  17 g Oral Daily  . sodium chloride flush  3 mL Intravenous Q12H   Continuous Infusions: . sodium chloride      Procedures/Studies: Dg Chest 2 View  Result Date: 05/15/2017 CLINICAL DATA:  Increased sob x 1 week. Has been doing neb treatments but they are not really helping. O2 sats at 92-93% on 3L when she was brought in by EMS EXAM: CHEST - 2 VIEW COMPARISON:  Chest radiograph 05/21/2012, CT 05/23/2012 FINDINGS: Sternotomy wires overlie  normal cardiac silhouette. There is a moderate RIGHT effusion which is increased from prior (05/21/2012). No focal consolidation. LEFT lung relatively clear. There is fine interstitial pattern. No aggressive osseous lesion. IMPRESSION: Chronic unilateral RIGHT pleural effusion is increased from comparison exam 5 years prior. Mild interstitial edema. Electronically Signed   By: Genevive Bi M.D.   On: 05/15/2017 16:12   [place] Chest (pleural Effusion)  Result Date: 05/16/2017 CLINICAL DATA:  Pleural effusion. EXAM: CHEST ULTRASOUND COMPARISON:  Chest x-ray 05/15/2017. FINDINGS: Small right pleural effusions noted. Pleural effusions not PICC enough for safe thoracentesis. Repeat evaluation can be obtained as needed. IMPRESSION: Small right pleural effusion.  Thoracentesis not  performed. Electronically Signed   By: Maisie Fus  Register   On: 05/16/2017 15:12   Dg Chest Port 1 View  Result Date: 05/18/2017 CLINICAL DATA:  Shortness of breath EXAM: PORTABLE CHEST 1 VIEW COMPARISON:  Chest radiograph May 15, 2017 and chest CT May 23, 2012 FINDINGS: There is interstitial edema, more on the left than on the right with consolidation both lung bases. There are small pleural effusions bilaterally. Heart is mildly enlarged with pulmonary venous hypertension. Patient is status post median sternotomy. There is aortic atherosclerosis. No adenopathy. No evident bone lesions. IMPRESSION: Interstitial edema with cardiomegaly and bilateral pleural effusions suspect a degree of underlying congestive heart failure. Consolidation in both lung bases, primarily medially, raises concern for pneumonia, although alveolar edema may present in this manner. Both entities may present concurrently. Electronically Signed   By: Bretta Bang III M.D.   On: 05/18/2017 17:13    Catarina Hartshorn, DO  [facility] Pager [phone]  If 7PM-7AM, please contact night-coverage [email or web address] Password [email or web address] 05/18/2017, 5:21 PM   LOS: 3 days

## 2017-05-19 DIAGNOSIS — J90 Pleural effusion, not elsewhere classified: Secondary | ICD-10-CM

## 2017-05-19 LAB — BASIC METABOLIC PANEL
Anion gap: 11 (ref 5–15)
BUN: 26 mg/dL — AB (ref 6–20)
CALCIUM: 9.1 mg/dL (ref 8.9–10.3)
CO2: 36 mmol/L — ABNORMAL HIGH (ref 22–32)
CREATININE: 0.72 mg/dL (ref 0.44–1.00)
Chloride: 98 mmol/L — ABNORMAL LOW (ref 101–111)
GFR calc Af Amer: >60 mL/min (ref >60)
GLUCOSE: 199 mg/dL — AB (ref 65–99)
POTASSIUM: 4.1 mmol/L (ref 3.5–5.1)
SODIUM: 145 mmol/L (ref 135–145)

## 2017-05-19 MED ORDER — FUROSEMIDE 10 MG/ML IJ SOLN
80.0000 mg | Freq: Two times a day (BID) | INTRAMUSCULAR | Status: DC
  Administered 2017-05-19 – 2017-05-21 (×4): 80 mg via INTRAVENOUS
  Filled 2017-05-19 (×4): qty 8

## 2017-05-19 MED ORDER — SALINE SPRAY 0.65 % NA SOLN
1.0000 | NASAL | Status: DC | PRN
  Administered 2017-05-19: 1 via NASAL
  Filled 2017-05-19: qty 44

## 2017-05-19 MED ORDER — METHYLPREDNISOLONE SODIUM SUCC 125 MG IJ SOLR
60.0000 mg | Freq: Two times a day (BID) | INTRAMUSCULAR | Status: DC
  Administered 2017-05-19 – 2017-05-21 (×4): 60 mg via INTRAVENOUS
  Filled 2017-05-19 (×4): qty 2

## 2017-05-19 MED ORDER — METOLAZONE 5 MG PO TABS
5.0000 mg | ORAL_TABLET | Freq: Once | ORAL | Status: AC
  Administered 2017-05-19: 5 mg via ORAL
  Filled 2017-05-19: qty 1

## 2017-05-19 MED ORDER — FUROSEMIDE 10 MG/ML IJ SOLN
40.0000 mg | Freq: Once | INTRAMUSCULAR | Status: AC
  Administered 2017-05-19: 40 mg via INTRAVENOUS
  Filled 2017-05-19: qty 4

## 2017-05-19 NOTE — Progress Notes (Addendum)
PROGRESS NOTE  Julie Burns UJW:[id number] DOB: 12-May-1930 DOA: 05/15/2017 PCP: Joette Catching, MD  Brief History: 84 year old female with a history of atrial fibrillation, coronary disease, CKD, chronic respiratory failure on3L, COPD, diabetes mellitus type 2, hyperlipidemia, hypothyroidism presenting with 1 week history of worseningshortness of breath. The patient has been having dyspnea on minimal exertion. She has also been complaining worseninglower extremity edema.The patient is also complaining of intermittent left shoulder pain. She feels this may be related to angina. She takes a nitroglycerin which she states helps the pain. She states that her primary care provider, Dr. Denzil Magnuson decreased her furosemide from 40 mg twice daily to once daily approximately 1 month prior to this admission.Upon EMS arrival, the patient had oxygen saturation of 92% on 3 L.She had AVR in 2010 (21mm Magna valve) and PAF on anticoagulation and amiodarone.Upon presentation, the patient was noted to have an increased right pleural effusion on chest x-ray with interstitial edema. She was started on intravenous furosemidewith good clinical results.    Assessment/Plan: Acute onChronic respiratory failure with hypoxia -Multifactorial including pulmonary edema, COPD, deconditioning, OSA/OHS -She has chronic dyspnea on3L at baseline -She gets around with a walker -05/19/17 increase to 6L-->saturation 92% -She has very little functional reserve and becomes very dyspneic with minimal exertion   AcuteDiastolic CHF -daily weights--stable -accurate I/O's--urine output has been <1L last 2-3 days -fluid restrict -Echo--EF 65-70%, no WMA, PASP 49, mild TR, trivial MR -Increase IV Lasix to 80 mg bid -add metolazone 5 mg po x 1 -Continue metoprolol succinate -3/16--personally reviewed CXR--bilateral pleural effusions.  Increased interstitial markings  COPD exacerbation -Decrease IV  steroids -continue Pulmicort -Continue duo nebs  Left shoulder pain -Concerned about anginal equivalent -Cardiology consultappreciated--ischemic testing not needed at present time -Relieved with nitroglycerin -troponins neg x 3  Atrial fibrillation, type unspecified -Continue apixaban -Continue amiodarone -Remains in sinus rhythm  CKD stage 3 -baseline creatinine 0.7-1.0  Coronary artery disease -No chest pain presently -DES to the RCA staged PCI to the LAD and circumflex in 2015. She was thought not to be a CABG candidate  Bioprosthetic AVR -Placed in 2010 -Echocardiogramas above  Right pleural effusion -Request thoracocentesis--US chest showed effusion too small to tap  Essential hypertension -Continue metoprolol succinate, lisinopril -d/c cardura -decrease metoprolol to 25 mg and lisinopril to 5 mg daily  COPD -Compensated presently -Continue Pulmicort   Hypothyroidism -Continue Synthroid  Carotid stenosis -Status post left CEA -She has a moderate right ICA stenosis -Due for repeat carotid ultrasound December 2019  Goals of Care -long discussion with pt and daughter-->change to DNR      Disposition Plan: Home when fluid/respiratory status improves  Family Communication:Daughter updated at bedside3/16   Consultants:cardiology  Code Status: FULL   DVT Prophylaxis:apixaban   Procedures: As Listed in Progress Note Above  Antibiotics: None    Subjective: Patient remains short of breath with minimal exertion and with long conversation.  She denies any chest pain, nausea, vomiting, diarrhea, abdominal pain, fevers, chills, headache, neck pain.  Objective: Vitals:   05/19/17 0136 05/19/17 0500 05/19/17 0802 05/19/17 0808  BP:  (!) 112/91    Pulse:  68    Resp:  18    Temp:  97.9 F (36.6 C)    TempSrc:  Oral    SpO2: 96% 98% (!) 86% (!) 86%  Weight:  102.5 kg (225 lb 15.5 oz)    Height:  Intake/Output Summary (Last 24 hours) at 05/19/2017 1109 Last data filed at 05/19/2017 0903 Gross per 24 hour  Intake 483 ml  Output 1100 ml  Net -617 ml   Weight change:  Exam:   General:  Pt is alert, follows commands appropriately, not in acute distress  HEENT: No icterus, No thrush, No neck mass, [place]/AT  Cardiovascular: RRR, S1/S2, no rubs, no gallops  Respiratory: Bilateral crackles, left greater than right.  No wheezing.  Diminished breath sounds at the bases.  Abdomen: Soft/+BS, non tender, non distended, no guarding  Extremities: 2 + LE edema, No lymphangitis, No petechiae, No rashes, no synovitis   Data Reviewed: I have personally reviewed following labs and imaging studies Basic Metabolic Panel: Recent Labs  Lab 05/15/17 1556 05/15/17 1600 05/16/17 0648 05/17/17 0451 05/18/17 0624 05/19/17 0613  NA 140  --  139 142 142 145  K 4.2  --  5.0 4.1 3.8 4.1  CL 96*  --  98* 95* 95* 98*  CO2 33*  --  28 37* 35* 36*  GLUCOSE 106*  --  131* 125* 121* 199*  BUN 24* 24* 20 20 23* 26*  CREATININE 1.01*  --  0.98 0.87 0.81 0.72  CALCIUM 8.7*  --  8.7* 8.6* 8.6* 9.1  MG  --   --   --  2.0 1.8  --    Liver Function Tests: Recent Labs  Lab 05/15/17 1556 05/17/17 0451  AST 19 12*  ALT 10* 9*  ALKPHOS 81 80  BILITOT 0.9 1.1  PROT 6.8 6.4*  ALBUMIN 3.4* 3.2*   No results for input(s): LIPASE, AMYLASE in the last 168 hours. No results for input(s): AMMONIA in the last 168 hours. Coagulation Profile: No results for input(s): INR, PROTIME in the last 168 hours. CBC: Recent Labs  Lab 05/15/17 1556  WBC 6.4  NEUTROABS 4.7  HGB 10.4*  HCT 35.3*  MCV 91.9  PLT 213   Cardiac Enzymes: Recent Labs  Lab 05/15/17 1600 05/15/17 1827 05/16/17 0024 05/16/17 0648  TROPONINI <0.03 <0.03 0.03* <0.03   BNP: Invalid input(s): POCBNP CBG: No results for input(s): GLUCAP in the last 168 hours. HbA1C: No results for input(s): HGBA1C in the last 72 hours. Urine  analysis: No results found for: COLORURINE, APPEARANCEUR, LABSPEC, PHURINE, GLUCOSEU, HGBUR, BILIRUBINUR, KETONESUR, PROTEINUR, UROBILINOGEN, NITRITE, LEUKOCYTESUR Sepsis Labs: @LABRCNTIP (procalcitonin:4,lacticidven:4) )No results found for this or any previous visit (from the past 240 hour(s)).   Scheduled Meds: . amiodarone  200 mg Oral Daily  . apixaban  5 mg Oral BID  . budesonide  0.5 mg Nebulization BID  . cholecalciferol  5,000 Units Oral Daily  . furosemide  40 mg Intravenous Once  . furosemide  80 mg Intravenous BID  . gabapentin  100 mg Oral QHS  . ipratropium-albuterol  3 mL Nebulization Q6H  . levothyroxine  112.5 mcg Oral Once per day on Mon Thu  . levothyroxine  75 mcg Oral Once per day on Sun Tue Wed Fri Sat  . lisinopril  5 mg Oral Daily  . mouth rinse  15 mL Mouth Rinse BID  . methylPREDNISolone (SOLU-MEDROL) injection  60 mg Intravenous Q6H  . metoprolol succinate  25 mg Oral Daily  . polyethylene glycol  17 g Oral Daily  . sodium chloride flush  3 mL Intravenous Q12H   Continuous Infusions: . sodium chloride      Procedures/Studies: Dg Chest 2 View  Result Date: 05/15/2017 CLINICAL DATA:  Increased sob x 1 week.  Has been doing neb treatments but they are not really helping. O2 sats at 92-93% on 3L when she was brought in by EMS EXAM: CHEST - 2 VIEW COMPARISON:  Chest radiograph 05/21/2012, CT 05/23/2012 FINDINGS: Sternotomy wires overlie normal cardiac silhouette. There is a moderate RIGHT effusion which is increased from prior (05/21/2012). No focal consolidation. LEFT lung relatively clear. There is fine interstitial pattern. No aggressive osseous lesion. IMPRESSION: Chronic unilateral RIGHT pleural effusion is increased from comparison exam 5 years prior. Mild interstitial edema. Electronically Signed   By: Genevive BiStewart  Edmunds M.D.   On: 05/15/2017 16:12   [place] Chest (pleural Effusion)  Result Date: 05/16/2017 CLINICAL DATA:  Pleural effusion. EXAM: CHEST  ULTRASOUND COMPARISON:  Chest x-ray 05/15/2017. FINDINGS: Small right pleural effusions noted. Pleural effusions not PICC enough for safe thoracentesis. Repeat evaluation can be obtained as needed. IMPRESSION: Small right pleural effusion.  Thoracentesis not performed. Electronically Signed   By: Maisie Fushomas  Register   On: 05/16/2017 15:12   Dg Chest Port 1 View  Result Date: 05/18/2017 CLINICAL DATA:  Shortness of breath EXAM: PORTABLE CHEST 1 VIEW COMPARISON:  Chest radiograph May 15, 2017 and chest CT May 23, 2012 FINDINGS: There is interstitial edema, more on the left than on the right with consolidation both lung bases. There are small pleural effusions bilaterally. Heart is mildly enlarged with pulmonary venous hypertension. Patient is status post median sternotomy. There is aortic atherosclerosis. No adenopathy. No evident bone lesions. IMPRESSION: Interstitial edema with cardiomegaly and bilateral pleural effusions suspect a degree of underlying congestive heart failure. Consolidation in both lung bases, primarily medially, raises concern for pneumonia, although alveolar edema may present in this manner. Both entities may present concurrently. Electronically Signed   By: Bretta BangWilliam  Woodruff III M.D.   On: 05/18/2017 17:13    Catarina Hartshornavid Rolando Hessling, DO  [facility] Pager [phone]  If 7PM-7AM, please contact night-coverage [email or web address] Password [email or web address] 05/19/2017, 11:09 AM   LOS: 4 days

## 2017-05-20 ENCOUNTER — Encounter (HOSPITAL_COMMUNITY): Payer: Self-pay | Admitting: Primary Care

## 2017-05-20 DIAGNOSIS — Z7189 Other specified counseling: Secondary | ICD-10-CM

## 2017-05-20 DIAGNOSIS — Z515 Encounter for palliative care: Secondary | ICD-10-CM

## 2017-05-20 LAB — BASIC METABOLIC PANEL
Anion gap: 13 (ref 5–15)
BUN: 38 mg/dL — AB (ref 6–20)
CO2: 40 mmol/L — AB (ref 22–32)
Calcium: 9.6 mg/dL (ref 8.9–10.3)
Chloride: 88 mmol/L — ABNORMAL LOW (ref 101–111)
Creatinine, Ser: 1.05 mg/dL — ABNORMAL HIGH (ref 0.44–1.00)
GFR calc Af Amer: 55 mL/min — ABNORMAL LOW (ref >60)
GFR, EST NON AFRICAN AMERICAN: 47 mL/min — AB (ref >60)
GLUCOSE: 214 mg/dL — AB (ref 65–99)
Potassium: 3.8 mmol/L (ref 3.5–5.1)
Sodium: 141 mmol/L (ref 135–145)

## 2017-05-20 LAB — CBC
HCT: 37.7 % (ref 36.0–46.0)
Hemoglobin: 10.9 g/dL — ABNORMAL LOW (ref 12.0–15.0)
MCH: 26.8 pg (ref 26.0–34.0)
MCHC: 28.9 g/dL — ABNORMAL LOW (ref 30.0–36.0)
MCV: 92.6 fL (ref 78.0–100.0)
PLATELETS: 294 10*3/uL (ref 150–400)
RBC: 4.07 MIL/uL (ref 3.87–5.11)
RDW: 14.9 % (ref 11.5–15.5)
WBC: 13.8 10*3/uL — ABNORMAL HIGH (ref 4.0–10.5)

## 2017-05-20 LAB — MAGNESIUM: Magnesium: 2 mg/dL (ref 1.7–2.4)

## 2017-05-20 NOTE — Consult Note (Signed)
Consultation Note Date: 05/20/2017   Patient Name: Julie Burns  DOB: [birth year]  MRN: [id number]  Age / Sex: 84 y.o., female  PCP: Julie Housekeeper, MD Referring Physician: Orson Eva, MD  Reason for Consultation: Establishing goals of care and Psychosocial/spiritual support  HPI/Patient Profile: 85 y.o. female  with past medical history of extensive coronary history including atrial fibrillation, coronary artery disease with an end STEMI and stenting in 2015, carotid endarterectomy 2013, high blood pressure and cholesterol, aortic valve replacement in 2010, heart failure, peripheral vascular disease, history of smoking approximately 24 years, diabetes, irritable bowel syndrome, osteoporosis, chronic kidney disease history, mastectomy for breast cancer in 2011, history of multiple hernia surgeries admitted on 05/15/2017 with acute on chronic heart failure.   Clinical Assessment and Goals of Care:  I have reviewed medical records including EPIC notes, labs and imaging, received report from hospitalist, Dr. Carles Burns and nursing staff, assessed the patient and then met at the bedside along with daughter Julie Burns to discuss diagnosis prognosis, [place], EOL wishes, disposition and options.  I introduced Palliative Medicine as specialized medical care for people living with serious illness. It focuses on providing relief from the symptoms and stress of a serious illness. The goal is to improve quality of life for both the patient and the family.  We discussed a brief life review of the patient.  [name] has had many jobs throughout her life.  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  She was married at 37, separated and divorced her first husband who has since passed, she later married again but also separated, second husband has since passed.  She has 2 living  children, daughter Julie Burns "Julie Burns,  who lives in [place], and a son who lives in [place].  She states that she has lost 4 children.  She was excited to see her first great, great, grandchild.  She has 1 living sibling who is a brother, 77 years old.  As far as functional and nutritional status she tells me that she does not stick to a strict no salt diet.  We also talked about fluid restrictions, and the use of as needed diuretics for fluid overload.  [name] has many surgeries in her plan the past including a mastectomy for breast cancer in 2011, multiple bladder surgeries, carotid endarterectomy, aortic valve replacement, 3 hernia surgeries.  [name] has a life alert type system which she tests at the beginning of each month.  She also has an aide Monday through Friday for 8 hours.  She tells me that she is able to get her own bathing mostly, aide helps with washing the hair, does housekeeping, fixes meals.  [name] states that she does some executive management, but daughter Julie Burns or the CNA will write checks for her.  Last fall was before Christmas, fell due to feet tangling in oxygen tubing.   We discussed their current illness and what it means in the larger  context of their on-going co-morbidities.  Natural disease trajectory and expectations at EOL were discussed.  We talked at length about how the heart works including 1) electrical, she has a history of A. fib with cardioversion.  2) vascular (she has extensive coronary artery disease, with stenting).  3) as a pump, (she has acute on chronic heart failure).  4) valvular problems, she has a history of aortic valve replacement in 2010.  We talked about natural progression of disease including rehospitalizations for both heart failure and COPD are expected.  We discussed both of these chronic illnesses as progressive.  I attempted to elicit values and goals of care important to the patient.  At this point, [name] states what worries  her most is not being with her pet cat at home.  I ask Julie Burns what is worrying her the most, she states the fluid in her mother's legs, her breathing status, and being at home alone in the evenings and overnight.  The difference between aggressive medical intervention and comfort care was considered in light of the patient's goals of care. Hospice services outpatient were explained and offered.   We discussed the benefits of at home hospice, I share that I am not sure that she would qualify for in-home hospice at this time.  She agrees.  [name] states that her great niece, Julie Burns, is a Government social research officer.  We talked about doctor's visits.  I share that hospice can sometimes take away some of the burden of traveling to see physicians.  Advanced directives, concepts specific to code status, and rehospitalization were considered and discussed.  [name] states that she is ready to leave, she wants to go home.  I share that I will help her to get home if she wants, but encouraged her to consider that when she feels worse than a few days, which she come back to the hospital.  We talked about preferred place of death, home if possible, residential hospice otherwise.  [name] is, at this point, agreeable to spend another night in the hospital.  Questions and concerns were addressed.   The family was encouraged to call with questions or concerns.  We plan for follow-up meeting 3/19 at 2 PM.  Call from nursing staff who state that daughter Julie Burns would like a return phone call.  She has left the hospital.  Call to daughter, she states that [name] great-niece, hospice RN Julie Burns, will be calling PMT tomorrow.   Healthcare power of attorney NEXT OF KIN -daughter "Julie Burns" [place].  Son living in [place], has lost 4 children.   SUMMARY OF RECOMMENDATIONS   At this point, continue to treat the treatable.  Code Status/Advance Care Planning:  DNR  Symptom Management:   Per hospitalist, no  additional needs at this time.  Palliative Prophylaxis:   No special needs at this time  Additional Recommendations (Limitations, Scope, Preferences):  No Surgical Procedures and Continue to treat the treatable but no CPR, no intubation.  Psycho-social/Spiritual:   Desire for further Chaplaincy support:no  Additional Recommendations: Caregiving  Support/Resources and Education on Hospice  Prognosis:   < 6 months, or less would not be surprising based on frailty, functional status,.   Discharge Planning: Likely return home with home health, CAP aide already in place      Primary Diagnoses: Present on Admission: . A-fib ([place]) . CAD (coronary artery disease) of artery bypass graft . COPD (chronic obstructive pulmonary disease) ([place]) . Acute on chronic respiratory  failure with hypoxia ([place]) . Coronary artery disease involving native coronary artery of native heart without angina pectoris   I have reviewed the medical record, interviewed the patient and family, and examined the patient. The following aspects are pertinent.  Past Medical History:  Diagnosis Date  . Age-related osteoporosis without current pathological fracture   . Anxiety disorder   . Atrial fibrillation ([place])   . CAD (coronary artery disease)    12/2013-DES to RCA due to NSTEMI- staged PCI to LAD and CX (not ideal for CABG; Echocardiogram 01/16/10 EF 55%, severe LVH; Moderate AS; mild ot moderate TR  . Chronic kidney disease   . Chronic respiratory failure with hypoxia ([place])   . COPD (chronic obstructive pulmonary disease) ([place])   . Degenerative myopia with macular hole   . Diabetes ([place])    TYPE 2  . Hyperlipidemia   . Hypothyroidism   . Irritable bowel syndrome without diarrhea   . NSTEMI (non-ST elevated myocardial infarction) ([place])    NSTEMI 3/05; s/p PTCA with stenting of left circumflex coronary artery 3/05; totally occluded small vessel OM  . Occlusion and stenosis of right carotid artery     Carotid Duplex 7/13 moderate disease (<70%) of the right internal carotid arteries, this is a high bifurcation, s/p left CEA without significant stenosis.  . Polyp of colon   . Presence of prosthetic heart valve    S/P aortic valve replacement 04/29/2008   . PVD (peripheral vascular disease) ([place])   . Sicca syndrome ([place])   . Stroke (cerebrum) ([place])   . Unspecified abdominal hernia without obstruction or gangrene   . Vitamin D deficiency    Social History   Socioeconomic History  . Marital status: Widowed    Spouse name: None  . Number of children: None  . Years of education: None  . Highest education level: None  Social Needs  . Financial resource strain: None  . Food insecurity - worry: None  . Food insecurity - inability: None  . Transportation needs - medical: None  . Transportation needs - non-medical: None  Occupational History  . None  Tobacco Use  . Smoking status: Former Smoker    Packs/day: 3.00    Types: Cigarettes    Last attempt to quit: 10/23/1976    Years since quitting: 40.6  . Smokeless tobacco: Never Used  Substance and Sexual Activity  . Alcohol use: No  . Drug use: No  . Sexual activity: No  Other Topics Concern  . None  Social History Narrative  . None   Family History  Problem Relation Age of Onset  . Hypertension Mother   . CAD Mother   . Hypertension Father   . CAD Father   . Hypertension Sister   . CAD Sister   . Hypertension Brother   . CAD Brother   . Diabetes Brother   . Hypertension Brother   . Diabetes Brother   . COPD Son    Scheduled Meds: . amiodarone  200 mg Oral Daily  . apixaban  5 mg Oral BID  . budesonide  0.5 mg Nebulization BID  . cholecalciferol  5,000 Units Oral Daily  . furosemide  80 mg Intravenous BID  . gabapentin  100 mg Oral QHS  . ipratropium-albuterol  3 mL Nebulization Q6H  . levothyroxine  112.5 mcg Oral Once per day on Mon Thu  . levothyroxine  75 mcg Oral Once per day on Sun Tue Wed Fri Sat  .  lisinopril  5 mg Oral Daily  . mouth rinse  15 mL Mouth Rinse BID  . methylPREDNISolone (SOLU-MEDROL) injection  60 mg Intravenous Q12H  . metoprolol succinate  25 mg Oral Daily  . polyethylene glycol  17 g Oral Daily  . sodium chloride flush  3 mL Intravenous Q12H   Continuous Infusions: . sodium chloride     PRN Meds:.sodium chloride, acetaminophen, benzonatate, fluticasone, ipratropium-albuterol, LORazepam, magic mouthwash, nitroGLYCERIN, ondansetron (ZOFRAN) IV, ondansetron, promethazine, sodium chloride, sodium chloride flush Medications Prior to Admission:  Prior to Admission medications   Medication Sig Start Date End Date Taking? Authorizing Provider  alendronate (FOSAMAX) 70 MG tablet Take 70 mg by mouth once a week. Take with a full glass of water on an empty stomach. Take on Sunday's.   Yes [provider]  amiodarone (PACERONE) 200 MG tablet Take 200 mg by mouth daily.   Yes [provider]  apixaban (ELIQUIS) 5 MG TABS tablet Take 5 mg by mouth 2 (two) times daily.   Yes [provider]  benzonatate (TESSALON) 200 MG capsule Take 200 mg by mouth 3 (three) times daily as needed for cough.   Yes [provider]  budesonide (PULMICORT) 0.5 MG/2ML nebulizer solution Take 0.5 mg by nebulization 2 (two) times daily.   Yes [provider]  Cholecalciferol (VITAMIN D3) 5000 units CAPS Take 1 capsule by mouth daily.   Yes [provider]  doxazosin (CARDURA) 2 MG tablet Take 2 mg by mouth at bedtime.    Yes [provider]  fluticasone (FLONASE) 50 MCG/ACT nasal spray Place 2 sprays into both nostrils daily as needed for allergies.    Yes [provider]  furosemide (LASIX) 40 MG tablet Take 40 mg by mouth daily.   Yes [provider]  gabapentin (NEURONTIN) 100 MG capsule Take 100 mg by mouth at bedtime.    Yes [provider]  Ipratropium-Albuterol (COMBIVENT RESPIMAT) 20-100 MCG/ACT AERS respimat  Inhale 1-2 puffs into the lungs every 4 (four) hours as needed for wheezing or shortness of breath. 05/10/17  Yes [provider]  ipratropium-albuterol (DUONEB) 0.5-2.5 (3) MG/3ML SOLN Take 3 mLs by nebulization every 6 (six) hours as needed.   Yes [provider]  levothyroxine (SYNTHROID, LEVOTHROID) 75 MCG tablet Take 75 mcg by mouth. Take one tablet daily by mouth except Mon. And Thurs. Take 1 1/2 tablets   Yes [provider]  lisinopril (PRINIVIL,ZESTRIL) 20 MG tablet Take 20 mg by mouth daily.   Yes [provider]  LORazepam (ATIVAN) 0.5 MG tablet Take 0.5 mg by mouth 2 (two) times daily as needed for anxiety or sleep.    Yes [provider]  magic mouthwash SOLN Take 5 mLs by mouth daily as needed for mouth pain (thrush).   Yes [provider]  Melatonin-Pyridoxine (MELATONIN TR WITH VITAMIN B6) 3-10 MG TBCR Take 1 capsule by mouth at bedtime as needed (sleep).    Yes [provider]  metoprolol succinate (TOPROL-XL) 100 MG 24 hr tablet Take 50-100 mg by mouth daily. Takes 50 mg tablet daily. If blood pressure is elevated take additional 50 mg tablet.   Yes [provider]  Multiple Vitamins-Minerals (ICAPS AREDS FORMULA PO) Take 1 capsule by mouth daily.   Yes [provider]  nitroGLYCERIN (NITROSTAT) 0.4 MG SL tablet Place 0.4 mg under the tongue every 5 (five) minutes x 3 doses as needed for chest pain.   Yes [provider]  nystatin-triamcinolone ointment (  MYCOLOG) Apply 1 application topically 2 (two) times daily as needed (rash).    Yes [provider]  ondansetron (ZOFRAN) 4 MG tablet Take 4 mg by mouth every 8 (eight) hours as needed for nausea or vomiting.   Yes [provider]  polyethylene glycol (MIRALAX / GLYCOLAX) packet Take 17 g by mouth daily.   Yes [provider]  promethazine (PHENERGAN) 12.5 MG tablet Take 12.5 mg by mouth every 6 (six) hours as needed for  nausea or vomiting.   Yes [provider]  ranitidine (ZANTAC) 150 MG tablet Take 150 mg by mouth 2 (two) times daily.   Yes [provider]  traMADol (ULTRAM) 50 MG tablet Take 50 mg by mouth every 6 (six) hours as needed for moderate pain.    Yes [provider]   Allergies  Allergen Reactions  . Bee Venom Swelling  . Codeine Hives  . Morphine And Related Other (See Comments)    Patient states she "felt weird".  . Norvasc [Amlodipine Besylate] Swelling  . Sulfa Antibiotics   . Valium [Diazepam]   . Zetia [Ezetimibe]   . Penicillins Hives and Rash    Has patient had a PCN reaction causing immediate rash, facial/tongue/throat swelling, SOB or lightheadedness with hypotension: yes Has patient had a PCN reaction causing severe rash involving mucus membranes or skin necrosis: Unknown Has patient had a PCN reaction that required hospitalization: No Has patient had a PCN reaction occurring within the last 10 years: No If all of the above answers are "NO", then may proceed with Cephalosporin use.    Review of Systems  Unable to perform ROS: Age    Physical Exam  Constitutional: She is oriented to person, place, and time. She appears ill.  Makes and keeps eye contact, appears acutely/chronically ill  HENT:  Head: Normocephalic and atraumatic.  Cardiovascular: Normal rate.  Pulmonary/Chest:  Some work of breathing noted, able to complete full sentences  Abdominal: Soft. She exhibits no distension.  Musculoskeletal:       Right lower leg: She exhibits edema.       Left lower leg: She exhibits edema.  Neurological: She is alert and oriented to person, place, and time.  Skin: Skin is warm and dry.  Psychiatric: Her mood appears not anxious. She is not agitated.  Calm  Nursing note and vitals reviewed.   Vital Signs: BP (!) 144/49 (BP Location: Right Arm)   Pulse 80   Temp 98.2 F (36.8 C) (Oral)   Resp 16   Ht 5' 6" (1.676 m)   Wt 99.3 kg (218 lb  14.7 oz)   SpO2 91%   BMI 35.33 kg/m  Pain Assessment: No/denies pain POSS *See Group Information*: 1-Acceptable,Awake and alert Pain Score: 0-No pain   SpO2: SpO2: 91 % O2 Device:SpO2: 91 % O2 Flow Rate: .O2 Flow Rate (L/min): 3.5 L/min  IO: Intake/output summary:   Intake/Output Summary (Last 24 hours) at 05/20/2017 1621 Last data filed at 05/20/2017 1047 Gross per 24 hour  Intake 240 ml  Output 1950 ml  Net -1710 ml    LBM: Last BM Date: 05/18/17 Baseline Weight: Weight: 104.3 kg (230 lb) Most recent weight: Weight: 99.3 kg (218 lb 14.7 oz)     Palliative Assessment/Data:   Flowsheet Rows     Most Recent Value  Intake Tab  Referral Department  Hospitalist  Unit at Time of Referral  Cardiac/Telemetry Unit  Palliative Care Primary Diagnosis  Cardiac  Date Notified  05/20/17  Palliative Care Type  New Palliative care  Reason for referral  Clarify Goals of Care  Date of Admission  05/15/17  Date first seen by Palliative Care  05/20/17  # of days Palliative referral response time  0 Day(s)  # of days IP prior to Palliative referral  5  Clinical Assessment  Palliative Performance Scale Score  30%  Pain Max last 24 hours  Not able to report  Pain Min Last 24 hours  Not able to report  Dyspnea Max Last 24 Hours  Not able to report  Dyspnea Min Last 24 hours  Not able to report  Psychosocial & Spiritual Assessment  Palliative Care Outcomes  Patient/Family meeting held?  Yes  Who was at the meeting?  Patient and daughter Julie Burns at [place] regarding hospice, Provided advance care planning, Provided psychosocial or spiritual support, Clarified goals of care  Patient/Family wishes: Interventions discontinued/not started   Mechanical Ventilation      Time In: 1435 Time Out: 1605 Time Total: 90 minutes Greater than 50%  of this time was spent counseling and coordinating care related to the above assessment and plan.  Signed by: Drue Novel, NP   Please contact Palliative Medicine Team phone at [phone] for questions and concerns.  For individual provider: See Shea Evans

## 2017-05-20 NOTE — Progress Notes (Addendum)
Progress Note  Patient Name: Julie Burns Date of Encounter: 05/20/2017  Primary Cardiologist: Rollene Rotunda, MD   Subjective   It's her birthday today and she wants to go home. Thinks she'll do better there.  Inpatient Medications    Scheduled Meds: . amiodarone  200 mg Oral Daily  . apixaban  5 mg Oral BID  . budesonide  0.5 mg Nebulization BID  . cholecalciferol  5,000 Units Oral Daily  . furosemide  80 mg Intravenous BID  . gabapentin  100 mg Oral QHS  . ipratropium-albuterol  3 mL Nebulization Q6H  . levothyroxine  112.5 mcg Oral Once per day on Mon Thu  . levothyroxine  75 mcg Oral Once per day on Sun Tue Wed Fri Sat  . lisinopril  5 mg Oral Daily  . mouth rinse  15 mL Mouth Rinse BID  . methylPREDNISolone (SOLU-MEDROL) injection  60 mg Intravenous Q12H  . metoprolol succinate  25 mg Oral Daily  . polyethylene glycol  17 g Oral Daily  . sodium chloride flush  3 mL Intravenous Q12H   Continuous Infusions: . sodium chloride     PRN Meds: sodium chloride, acetaminophen, benzonatate, fluticasone, ipratropium-albuterol, LORazepam, magic mouthwash, nitroGLYCERIN, ondansetron (ZOFRAN) IV, ondansetron, promethazine, sodium chloride, sodium chloride flush   Vital Signs    Vitals:   05/19/17 1944 05/19/17 2104 05/20/17 0500 05/20/17 0642  BP:  (!) 124/48 (!) 144/49   Pulse: 77 79 80   Resp: 16 16 16    Temp:  98.5 F (36.9 C) 98.2 F (36.8 C)   TempSrc:  Oral Oral   SpO2: 92% 92% 92% 95%  Weight:   218 lb 14.7 oz (99.3 kg)   Height:        Intake/Output Summary (Last 24 hours) at 05/20/2017 1023 Last data filed at 05/20/2017 0543 Gross per 24 hour  Intake 480 ml  Output 1800 ml  Net -1320 ml   Filed Weights   05/17/17 0605 05/19/17 0500 05/20/17 0500  Weight: 230 lb 9.6 oz (104.6 kg) 225 lb 15.5 oz (102.5 kg) 218 lb 14.7 oz (99.3 kg)    Telemetry    NSR with PVC's - Personally Reviewed  ECG       Physical Exam   GEN: No acute distress.   Neck:   Slight increase JVD Cardiac: RRR, 2/6 systolic murmur at the apex Respiratory:  Decreased breath sounds without rales GI: Soft, nontender, non-distended  MS:  +2 edema on right +1 on left; No deformity. Neuro:  Nonfocal  Psych: Normal affect   Labs    Chemistry Recent Labs  Lab 05/15/17 1556  05/17/17 0451 05/18/17 0624 05/19/17 0613 05/20/17 0549  NA 140   < > 142 142 145 141  K 4.2   < > 4.1 3.8 4.1 3.8  CL 96*   < > 95* 95* 98* 88*  CO2 33*   < > 37* 35* 36* 40*  GLUCOSE 106*   < > 125* 121* 199* 214*  BUN 24*   < > 20 23* 26* 38*  CREATININE 1.01*   < > 0.87 0.81 0.72 1.05*  CALCIUM 8.7*   < > 8.6* 8.6* 9.1 9.6  PROT 6.8  --  6.4*  --   --   --   ALBUMIN 3.4*  --  3.2*  --   --   --   AST 19  --  12*  --   --   --   ALT 10*  --  9*  --   --   --   ALKPHOS 81  --  80  --   --   --   BILITOT 0.9  --  1.1  --   --   --   GFRNONAA 50*   < > 59* >60 >60 47*  GFRAA 58*   < > >60 >60 >60 55*  ANIONGAP 11   < > 10 12 11 13    < > = values in this interval not displayed.     Hematology Recent Labs  Lab 05/15/17 1556 05/20/17 0549  WBC 6.4 13.8*  RBC 3.84* 4.07  HGB 10.4* 10.9*  HCT 35.3* 37.7  MCV 91.9 92.6  MCH 27.1 26.8  MCHC 29.5* 28.9*  RDW 14.5 14.9  PLT 213 294    Cardiac Enzymes Recent Labs  Lab 05/15/17 1600 05/15/17 1827 05/16/17 0024 05/16/17 0648  TROPONINI <0.03 <0.03 0.03* <0.03   No results for input(s): TROPIPOC in the last 168 hours.   BNP Recent Labs  Lab 05/18/17 1830  BNP 420.0*     DDimer No results for input(s): DDIMER in the last 168 hours.   Radiology    Dg Chest Port 1 View  Result Date: 05/18/2017 CLINICAL DATA:  Shortness of breath EXAM: PORTABLE CHEST 1 VIEW COMPARISON:  Chest radiograph May 15, 2017 and chest CT May 23, 2012 FINDINGS: There is interstitial edema, more on the left than on the right with consolidation both lung bases. There are small pleural effusions bilaterally. Heart is mildly enlarged with  pulmonary venous hypertension. Patient is status post median sternotomy. There is aortic atherosclerosis. No adenopathy. No evident bone lesions. IMPRESSION: Interstitial edema with cardiomegaly and bilateral pleural effusions suspect a degree of underlying congestive heart failure. Consolidation in both lung bases, primarily medially, raises concern for pneumonia, although alveolar edema may present in this manner. Both entities may present concurrently. Electronically Signed   By: Bretta BangWilliam  Woodruff III M.D.   On: 05/18/2017 17:13    Cardiac Studies   Echocardiogram: 05/08/2017 Study Conclusions   - Left ventricle: The cavity size was normal. Wall thickness was   increased in a pattern of mild LVH. Systolic function was   vigorous. The estimated ejection fraction was in the range of 65%   to 70%. Wall motion was normal; there were no regional wall   motion abnormalities. The study is not technically sufficient to   allow evaluation of LV diastolic function. - Aortic valve: Poorly visualized. A porcine bioprosthesis was   present. There was trivial regurgitation. Mean gradient (S): 19   mm Hg. - Mitral valve: Mildly calcified annulus. Mildly thickened,   moderately calcified leaflets . The findings are consistent with   mild stenosis. There was trivial regurgitation. Mean gradient   (D): 6 mm Hg. Valve area by pressure half-time: 1.61 cm^2. - Left atrium: The atrium was mildly dilated. - Right ventricle: The cavity size was mildly dilated. - Right atrium: Central venous pressure (est): 8 mm Hg. - Tricuspid valve: There was mild regurgitation. - Pulmonary arteries: Systolic pressure was moderately increased.   PA peak pressure: 48 mm Hg (S). - Pericardium, extracardiac: There was no pericardial effusion.     Patient Profile     84 y.o. female   w/ PMH of bioprosthetic AVR in 2010, CAD (s/p DES to RCA and staged PCI to LAD and LCx in 2015), PAF (s/p DCCV in 2017), carotid artery stenosis  and chronic hypoxic respiratory failure admitted with acute  on chronic diastolic CHF.   Assessment & Plan    Acute on chronic diastolic CHF recent echo with normal LVEF, has diuresed 2.7 L since admission, admission weight 230 now 218 pounds.  Still has some edema-as per Dr. Diona Browner would send home on Lasix 40 mg twice daily instead of once daily.  Patient could probably benefit from 1 more day in the hospital but is adamant about going home for her birthday and see in her great great grandchild .will need early follow-up with Dr. Domingo Dimes arrange.  CAD status post DES to the RCA and staged PCI to the LAD and circumflex in 2015 no chest pain, troponins negative  Status post bioprosthetic AVR in 2010 echo with mean gradient of 19 mmHg  Chronic hypoxic respiratory failure on 3 L O2 at home  PAF maintaining sinus rhythm on amiodarone and Eliquis  For questions or updates, please contact [facility] Please consult [email or web address] for contact info under Cardiology/STEMI.      Signed, Jacolyn Reedy, PA-C  05/20/2017, 10:23 AM    Pt seen and examined  I agree with findings as noted by Leda Gauze above   Pt is an 84 yo with CAD, PAF  Presents with CHF (acute on chronic diastolic CHF)    She has diuresed some since admit   On exam, still with volume increase Neck:  JVP increased   Cardiac RRR  No S3  Lungs have decreased airflow   Ext with 1 to 2 + edema     I have spoken with pt at length  She wants to go home   But, with her COPD(on 3L O2 at home) her overall status is fragile   I would recomm conitnued IV diuresis to a drier wt   She has no reserve if she decompensates again.  Will continue to follow.    Dietrich Pates

## 2017-05-20 NOTE — Progress Notes (Signed)
PROGRESS NOTE  LYNEL FORESTER WUJ:[id number] DOB: [birth year] DOA: 05/15/2017 PCP: Joette Catching, MD   Brief History: 84 year old female with a history of atrial fibrillation, coronary disease, CKD, chronic respiratory failure on3L, COPD, diabetes mellitus type 2, hyperlipidemia, hypothyroidism presenting with 1 week history of worseningshortness of breath. The patient has been having dyspnea on minimal exertion. She has also been complaining worseninglower extremity edema.The patient is also complaining of intermittent left shoulder pain. She feels this may be related to angina. She takes a nitroglycerin which she states helps the pain. She states that her primary care provider, Dr. Denzil Magnuson decreased her furosemide from 40 mg twice daily to once daily approximately 1 month prior to this admission.Upon EMS arrival, the patient had oxygen saturation of 92% on 3 L.She had AVR in 2010 (21mm Magna valve) and PAF on anticoagulation and amiodarone.Upon presentation, the patient was noted to have an increased right pleural effusion on chest x-ray with interstitial edema. She was started on intravenous furosemide; however, she continued to have inadequate diuresis.  She was producing <1L urine daily.  She subsequently developed respiratory distress on the evening of 05/18/2017.  Her furosemide was increased to 80 mg IV twice daily.  An additional dose of metolazone 5 mg x1 was given.   As a result, the patient's urine output improved, and her respiratory status also improved.    Assessment/Plan: Acute onChronic respiratory failure with hypoxia -Multifactorial including pulmonary edema, COPD, deconditioning, OSA/OHS -She has chronic dyspnea on3L at baseline -She gets around with a walker prior to admit -05/18/17 PM--developed respiratory distress -05/18/17 increase to 6L-->saturation 92%-->CXR with increased interstitial markings -She has very little functional reserve and  becomes very dyspneic with minimal exertion   AcuteDiastolic CHF -daily weights--NEG 12 lbs -urine output had been <1L every 24hrs -Echo--EF 65-70%, no WMA, PASP 49, mild TR, trivial MR -05/18/17 PM--developed respiratory distress -05/18/17 increase to 6L-->saturation 92%-->CXR with increased interstitial markings -3/17--IncreaseIV Lasix to 80 mg bid -add metolazone 5 mg po x 1 on 3/17 -Continue metoprolol succinate -remains clinically fluid overloaded  COPD exacerbation -Decrease IV steroids>>>prednisone -continue Pulmicort -Continue duo nebs  Left shoulder pain -Concerned about anginal equivalent -Cardiology consultappreciated--ischemic testing not needed at present time -Relieved with nitroglycerin -troponins neg x 3  Atrial fibrillation, type unspecified -Continue apixaban -Continue amiodarone -Remains in sinus rhythm  CKD stage 3 -baseline creatinine 0.7-1.0  Coronary artery disease -No chest pain presently -DES to the RCA staged PCI to the LAD and circumflex in 2015. She was thought not to be a CABG candidate  Bioprosthetic AVR -Placed in 2010 -Echocardiogramas above  Right pleural effusion -Request thoracocentesis--US chest showed effusion too small to tap  Essential hypertension -Continue metoprolol succinate, lisinopril -d/c cardura -decrease metoprolol to 25 mg and lisinopril to 5 mg daily  COPD -Compensated presently -Continue Pulmicort   Hypothyroidism -Continue Synthroid  Carotid stenosis -Status post left CEA -She has a moderate right ICA stenosis -Due for repeat carotid ultrasound December 2019  Goals of Care -long discussion with pt and daughter-->change to DNR   Disposition Plan: Home when fluid/respiratory status improves  Family Communication:Daughter updated at bedside3/17   Consultants:cardiology  Code Status: FULL   DVT Prophylaxis:apixaban   Procedures: As Listed in Progress Note  Above  Antibiotics: None      Subjective: Patient states that she wants to go home.  However she continues to remain short of breath going to the bathroom.  She denies  any nausea, vomiting, diarrhea, abdominal pain, dysuria, hematuria. Objective: Vitals:   05/19/17 1944 05/19/17 2104 05/20/17 0500 05/20/17 0642  BP:  (!) 124/48 (!) 144/49   Pulse: 77 79 80   Resp: 16 16 16    Temp:  98.5 F (36.9 C) 98.2 F (36.8 C)   TempSrc:  Oral Oral   SpO2: 92% 92% 92% 95%  Weight:   99.3 kg (218 lb 14.7 oz)   Height:        Intake/Output Summary (Last 24 hours) at 05/20/2017 1157 Last data filed at 05/20/2017 1047 Gross per 24 hour  Intake 480 ml  Output 2550 ml  Net -2070 ml   Weight change: -3.2 kg (-0.9 oz) Exam:   General:  Pt is alert, follows commands appropriately, not in acute distress  HEENT: No icterus, No thrush, No neck mass, [place]/AT  Cardiovascular: RRR, S1/S2, no rubs, no gallops  Respiratory: Bibasilar crackles.  No wheezing.  Good air movement.  Abdomen: Soft/+BS, non tender, non distended, no guarding  Extremities: 1 + LE edema, No lymphangitis, No petechiae, No rashes, no synovitis   Data Reviewed: I have personally reviewed following labs and imaging studies Basic Metabolic Panel: Recent Labs  Lab 05/16/17 0648 05/17/17 0451 05/18/17 0624 05/19/17 0613 05/20/17 0549  NA 139 142 142 145 141  K 5.0 4.1 3.8 4.1 3.8  CL 98* 95* 95* 98* 88*  CO2 28 37* 35* 36* 40*  GLUCOSE 131* 125* 121* 199* 214*  BUN 20 20 23* 26* 38*  CREATININE 0.98 0.87 0.81 0.72 1.05*  CALCIUM 8.7* 8.6* 8.6* 9.1 9.6  MG  --  2.0 1.8  --  2.0   Liver Function Tests: Recent Labs  Lab 05/15/17 1556 05/17/17 0451  AST 19 12*  ALT 10* 9*  ALKPHOS 81 80  BILITOT 0.9 1.1  PROT 6.8 6.4*  ALBUMIN 3.4* 3.2*   No results for input(s): LIPASE, AMYLASE in the last 168 hours. No results for input(s): AMMONIA in the last 168 hours. Coagulation Profile: No results for  input(s): INR, PROTIME in the last 168 hours. CBC: Recent Labs  Lab 05/15/17 1556 05/20/17 0549  WBC 6.4 13.8*  NEUTROABS 4.7  --   HGB 10.4* 10.9*  HCT 35.3* 37.7  MCV 91.9 92.6  PLT 213 294   Cardiac Enzymes: Recent Labs  Lab 05/15/17 1600 05/15/17 1827 05/16/17 0024 05/16/17 0648  TROPONINI <0.03 <0.03 0.03* <0.03   BNP: Invalid input(s): POCBNP CBG: No results for input(s): GLUCAP in the last 168 hours. HbA1C: No results for input(s): HGBA1C in the last 72 hours. Urine analysis: No results found for: COLORURINE, APPEARANCEUR, LABSPEC, PHURINE, GLUCOSEU, HGBUR, BILIRUBINUR, KETONESUR, PROTEINUR, UROBILINOGEN, NITRITE, LEUKOCYTESUR Sepsis Labs: @LABRCNTIP (procalcitonin:4,lacticidven:4) )No results found for this or any previous visit (from the past 240 hour(s)).   Scheduled Meds: . amiodarone  200 mg Oral Daily  . apixaban  5 mg Oral BID  . budesonide  0.5 mg Nebulization BID  . cholecalciferol  5,000 Units Oral Daily  . furosemide  80 mg Intravenous BID  . gabapentin  100 mg Oral QHS  . ipratropium-albuterol  3 mL Nebulization Q6H  . levothyroxine  112.5 mcg Oral Once per day on Mon Thu  . levothyroxine  75 mcg Oral Once per day on Sun Tue Wed Fri Sat  . lisinopril  5 mg Oral Daily  . mouth rinse  15 mL Mouth Rinse BID  . methylPREDNISolone (SOLU-MEDROL) injection  60 mg Intravenous Q12H  . metoprolol succinate  25 mg Oral Daily  . polyethylene glycol  17 g Oral Daily  . sodium chloride flush  3 mL Intravenous Q12H   Continuous Infusions: . sodium chloride      Procedures/Studies: Dg Chest 2 View  Result Date: 05/15/2017 CLINICAL DATA:  Increased sob x 1 week. Has been doing neb treatments but they are not really helping. O2 sats at 92-93% on 3L when she was brought in by EMS EXAM: CHEST - 2 VIEW COMPARISON:  Chest radiograph 05/21/2012, CT 05/23/2012 FINDINGS: Sternotomy wires overlie normal cardiac silhouette. There is a moderate RIGHT effusion which is  increased from prior (05/21/2012). No focal consolidation. LEFT lung relatively clear. There is fine interstitial pattern. No aggressive osseous lesion. IMPRESSION: Chronic unilateral RIGHT pleural effusion is increased from comparison exam 5 years prior. Mild interstitial edema. Electronically Signed   By: Genevive Bi M.D.   On: 05/15/2017 16:12   [place] Chest (pleural Effusion)  Result Date: 05/16/2017 CLINICAL DATA:  Pleural effusion. EXAM: CHEST ULTRASOUND COMPARISON:  Chest x-ray 05/15/2017. FINDINGS: Small right pleural effusions noted. Pleural effusions not PICC enough for safe thoracentesis. Repeat evaluation can be obtained as needed. IMPRESSION: Small right pleural effusion.  Thoracentesis not performed. Electronically Signed   By: Maisie Fus  Register   On: 05/16/2017 15:12   Dg Chest Port 1 View  Result Date: 05/18/2017 CLINICAL DATA:  Shortness of breath EXAM: PORTABLE CHEST 1 VIEW COMPARISON:  Chest radiograph May 15, 2017 and chest CT May 23, 2012 FINDINGS: There is interstitial edema, more on the left than on the right with consolidation both lung bases. There are small pleural effusions bilaterally. Heart is mildly enlarged with pulmonary venous hypertension. Patient is status post median sternotomy. There is aortic atherosclerosis. No adenopathy. No evident bone lesions. IMPRESSION: Interstitial edema with cardiomegaly and bilateral pleural effusions suspect a degree of underlying congestive heart failure. Consolidation in both lung bases, primarily medially, raises concern for pneumonia, although alveolar edema may present in this manner. Both entities may present concurrently. Electronically Signed   By: Bretta Bang III M.D.   On: 05/18/2017 17:13    Catarina Hartshorn, DO  [facility] Pager [phone]  If 7PM-7AM, please contact night-coverage [email or web address] Password [facility] 05/20/2017, 11:57 AM   LOS: 5 days

## 2017-05-21 DIAGNOSIS — I5030 Unspecified diastolic (congestive) heart failure: Secondary | ICD-10-CM

## 2017-05-21 LAB — BASIC METABOLIC PANEL
Anion gap: 14 (ref 5–15)
BUN: 48 mg/dL — AB (ref 6–20)
CHLORIDE: 86 mmol/L — AB (ref 101–111)
CO2: 41 mmol/L — AB (ref 22–32)
Calcium: 8.9 mg/dL (ref 8.9–10.3)
Creatinine, Ser: 0.99 mg/dL (ref 0.44–1.00)
GFR calc Af Amer: 59 mL/min — ABNORMAL LOW (ref >60)
GFR calc non Af Amer: 51 mL/min — ABNORMAL LOW (ref >60)
GLUCOSE: 186 mg/dL — AB (ref 65–99)
POTASSIUM: 3.1 mmol/L — AB (ref 3.5–5.1)
Sodium: 141 mmol/L (ref 135–145)

## 2017-05-21 LAB — CBC
HEMATOCRIT: 37.7 % (ref 36.0–46.0)
Hemoglobin: 11.1 g/dL — ABNORMAL LOW (ref 12.0–15.0)
MCH: 26.9 pg (ref 26.0–34.0)
MCHC: 29.4 g/dL — AB (ref 30.0–36.0)
MCV: 91.3 fL (ref 78.0–100.0)
Platelets: 316 10*3/uL (ref 150–400)
RBC: 4.13 MIL/uL (ref 3.87–5.11)
RDW: 14.8 % (ref 11.5–15.5)
WBC: 13 10*3/uL — ABNORMAL HIGH (ref 4.0–10.5)

## 2017-05-21 MED ORDER — LISINOPRIL 5 MG PO TABS: 5.0000 mg | ORAL_TABLET | Freq: Every day | ORAL | 1 refills | Status: DC

## 2017-05-21 MED ORDER — FUROSEMIDE 40 MG PO TABS: 40.0000 mg | ORAL_TABLET | Freq: Two times a day (BID) | ORAL | 1 refills | Status: DC

## 2017-05-21 MED ORDER — PREDNISONE 10 MG PO TABS: 60.0000 mg | ORAL_TABLET | Freq: Every day | ORAL | 0 refills | Status: DC

## 2017-05-21 MED ORDER — IPRATROPIUM-ALBUTEROL 0.5-2.5 (3) MG/3ML IN SOLN
3.0000 mL | Freq: Three times a day (TID) | RESPIRATORY_TRACT | Status: DC
  Administered 2017-05-21 (×2): 3 mL via RESPIRATORY_TRACT
  Filled 2017-05-21: qty 3

## 2017-05-21 NOTE — Progress Notes (Signed)
PT Cancellation Note  Patient Details Name: Julie Burns MRN: [id number] DOB: 12-Jun-1930   Cancelled Treatment:    Reason Eval/Treat Not Completed: Patient at procedure or test/unavailable. Nurse in with pt stating now is not A good time will check on on 3/20.   Virgina Organynthia Russell, PT CLT [phone]  05/21/2017, 1:43 PM

## 2017-05-21 NOTE — Care Management Important Message (Signed)
Important Message  Patient Details  Name: Julie Burns MRN: [id number] Date of Birth: [birth year]   Medicare Important Message Given:  Yes    Gwen Sarvis, Chrystine OilerSharley Diane, RN 05/21/2017, 11:58 AM

## 2017-05-21 NOTE — Discharge Summary (Signed)
Physician Discharge Summary  Julie Burns:[id number] DOB: [birth year] DOA: 05/15/2017  PCP: Joette Catching, MD  Admit date: 05/15/2017 Discharge date: 05/21/2017  Admitted From: Home Disposition:  Home  Recommendations for Outpatient Follow-up:  1. Follow up with PCP in 1-2 weeks 2. Please obtain BMP/CBC in one week 3. Please follow up on the following pending results:  Home Health: YES Equipment/Devices: HHPT, RN  Discharge Condition: Stable CODE STATUS: DNR Diet recommendation: Heart Healthy / Carb Modified   Brief/Interim Summary: 84 year old female with a history of atrial fibrillation, coronary disease, CKD, chronic respiratory failure on3L, COPD, diabetes mellitus type 2, hyperlipidemia, hypothyroidism presenting with 1 week history of worseningshortness of breath. The patient has been having dyspnea on minimal exertion. She has also been complaining worseninglower extremity edema.The patient is also complaining of intermittent left shoulder pain. She feels this may be related to angina. She takes a nitroglycerin which she states helps the pain. She states that her primary care provider, Dr. Denzil Magnuson decreased her furosemide from 40 mg twice daily to once daily approximately 1 month prior to this admission.Upon EMS arrival, the patient had oxygen saturation of 92% on 3 L.She had AVR in 2010 (21mm Magna valve) and PAF on anticoagulation and amiodarone.Upon presentation, the patient was noted to have an increased right pleural effusion on chest x-ray with interstitial edema. She was started on intravenous furosemide; however, she continued to have inadequate diuresis.  She was producing <1L urine daily.  She subsequently developed respiratory distress on the evening of 05/18/2017.  Her furosemide was increased to 80 mg IV twice daily.  An additional dose of metolazone 5 mg x1 was given.   As a result, the patient's urine output improved, and her respiratory status also  improved.    Discharge Diagnoses:  Acute onChronic respiratory failure with hypoxia -Multifactorial including pulmonary edema, COPD, deconditioning, OSA/OHS -She has chronic dyspnea on3L at baseline -She gets around with a walker prior to admit -05/18/17 PM--developed respiratory distress -05/18/17 increase to 6L-->saturation 92%-->CXR with increased interstitial markings -She has very little functional reserve and becomesverydyspneic with minimal exertion   AcuteDiastolic CHF -daily weights--NEG 12 lbs -urine output had been <1L every 24hrs -Echo--EF 65-70%, no WMA, PASP 49, mild TR, trivial MR -05/18/17 PM--developed respiratory distress -05/18/17 increase to 6L-->saturation 92%-->CXR with increased interstitial markings -3/17--IncreaseIV Lasixto 80 mg bid -add metolazone 5 mg po x 1 on 3/17 -Continue metoprolol succinate -remains clinically fluid overloaded -home with lasix 40 mg po bid  COPD exacerbation -DecreaseIV steroids>>>prednisone taper -continuePulmicort -Continue duo nebs  Left shoulder pain -Concerned about anginal equivalent -Cardiology consultappreciated--ischemic testing not needed at present time -Relieved with nitroglycerin -troponins neg x 3  Atrial fibrillation, type unspecified -Continue apixaban -Continue amiodarone -Remains in sinus rhythm  CKD stage 3 -baseline creatinine 0.7-1.0  Coronary artery disease -No chest pain presently -DES to the RCA staged PCI to the LAD and circumflex in 2015. She was thought not to be a CABG candidate  Bioprosthetic AVR -Placed in 2010 -Echocardiogramas above  Right pleural effusion -Request thoracocentesis--US chest showed effusion too small to tap  Essential hypertension -Continue metoprolol succinate, lisinopril -d/c cardura -decrease lisinopril to 5 mg daily  Hypothyroidism -Continue Synthroid  Carotid stenosis -Status post left CEA -She has a moderate right ICA  stenosis -Due for repeat carotid ultrasound December 2019  Goals of Care -long discussion with pt and daughter-->change to DNR     Discharge Instructions  Discharge Instructions    Diet - low sodium heart  healthy   Complete by:  As directed    Increase activity slowly   Complete by:  As directed      Allergies as of 05/21/2017      Reactions   Bee Venom Swelling   Codeine Hives   Morphine And Related Other (See Comments)   Patient states she "felt weird".   Norvasc [amlodipine Besylate] Swelling   Sulfa Antibiotics    Valium [diazepam]    Zetia [ezetimibe]    Penicillins Hives, Rash   Has patient had a PCN reaction causing immediate rash, facial/tongue/throat swelling, SOB or lightheadedness with hypotension: yes Has patient had a PCN reaction causing severe rash involving mucus membranes or skin necrosis: Unknown Has patient had a PCN reaction that required hospitalization: No Has patient had a PCN reaction occurring within the last 10 years: No If all of the above answers are "NO", then may proceed with Cephalosporin use.      Medication List    STOP taking these medications   doxazosin 2 MG tablet Commonly known as:  CARDURA     TAKE these medications   alendronate 70 MG tablet Commonly known as:  FOSAMAX Take 70 mg by mouth once a week. Take with a full glass of water on an empty stomach. Take on Sunday's.   amiodarone 200 MG tablet Commonly known as:  PACERONE Take 200 mg by mouth daily.   benzonatate 200 MG capsule Commonly known as:  TESSALON Take 200 mg by mouth 3 (three) times daily as needed for cough.   budesonide 0.5 MG/2ML nebulizer solution Commonly known as:  PULMICORT Take 0.5 mg by nebulization 2 (two) times daily.   ELIQUIS 5 MG Tabs tablet Generic drug:  apixaban Take 5 mg by mouth 2 (two) times daily.   fluticasone 50 MCG/ACT nasal spray Commonly known as:  FLONASE Place 2 sprays into both nostrils daily as needed for allergies.    furosemide 40 MG tablet Commonly known as:  LASIX Take 1 tablet (40 mg total) by mouth 2 (two) times daily. What changed:  when to take this   gabapentin 100 MG capsule Commonly known as:  NEURONTIN Take 100 mg by mouth at bedtime.   ICAPS AREDS FORMULA PO Take 1 capsule by mouth daily.   COMBIVENT RESPIMAT 20-100 MCG/ACT Aers respimat Generic drug:  Ipratropium-Albuterol Inhale 1-2 puffs into the lungs every 4 (four) hours as needed for wheezing or shortness of breath.   ipratropium-albuterol 0.5-2.5 (3) MG/3ML Soln Commonly known as:  DUONEB Take 3 mLs by nebulization every 6 (six) hours as needed.   levothyroxine 75 MCG tablet Commonly known as:  SYNTHROID, LEVOTHROID Take 75 mcg by mouth. Take one tablet daily by mouth except Mon. And Thurs. Take 1 1/2 tablets   lisinopril 5 MG tablet Commonly known as:  PRINIVIL,ZESTRIL Take 1 tablet (5 mg total) by mouth daily. What changed:    medication strength  how much to take   LORazepam 0.5 MG tablet Commonly known as:  ATIVAN Take 0.5 mg by mouth 2 (two) times daily as needed for anxiety or sleep.   magic mouthwash Soln Take 5 mLs by mouth daily as needed for mouth pain (thrush).   MELATONIN TR WITH VITAMIN B6 3-10 MG Tbcr Generic drug:  Melatonin-Pyridoxine Take 1 capsule by mouth at bedtime as needed (sleep).   metoprolol succinate 100 MG 24 hr tablet Commonly known as:  TOPROL-XL Take 50-100 mg by mouth daily. Takes 50 mg tablet daily. If blood pressure  is elevated take additional 50 mg tablet.   nitroGLYCERIN 0.4 MG SL tablet Commonly known as:  NITROSTAT Place 0.4 mg under the tongue every 5 (five) minutes x 3 doses as needed for chest pain.   nystatin-triamcinolone ointment Commonly known as:  MYCOLOG Apply 1 application topically 2 (two) times daily as needed (rash).   ondansetron 4 MG tablet Commonly known as:  ZOFRAN Take 4 mg by mouth every 8 (eight) hours as needed for nausea or vomiting.    polyethylene glycol packet Commonly known as:  MIRALAX / GLYCOLAX Take 17 g by mouth daily.   predniSONE 10 MG tablet Commonly known as:  DELTASONE Take 6 tablets (60 mg total) by mouth daily with breakfast. And decrease by one tablet daily Start taking on:  05/22/2017   promethazine 12.5 MG tablet Commonly known as:  PHENERGAN Take 12.5 mg by mouth every 6 (six) hours as needed for nausea or vomiting.   ranitidine 150 MG tablet Commonly known as:  ZANTAC Take 150 mg by mouth 2 (two) times daily.   traMADol 50 MG tablet Commonly known as:  ULTRAM Take 50 mg by mouth every 6 (six) hours as needed for moderate pain.   Vitamin D3 5000 units Caps Take 1 capsule by mouth daily.            Durable Medical Equipment  (From admission, onward)        Start     Ordered   05/21/17 1524  For home use only DME standard manual wheelchair with seat cushion  Once    Comments:  Patient suffers from COPD which impairs their ability to perform daily activities like walking in the home.  A cane will not resolve  issue with performing activities of daily living. A wheelchair will allow patient to safely perform daily activities. Patient can safely propel the wheelchair in the home or has a caregiver who can provide assistance.  Accessories: elevating leg rests (ELRs), wheel locks, extensions and anti-tippers.   05/21/17 1524      Allergies  Allergen Reactions  . Bee Venom Swelling  . Codeine Hives  . Morphine And Related Other (See Comments)    Patient states she "felt weird".  . Norvasc [Amlodipine Besylate] Swelling  . Sulfa Antibiotics   . Valium [Diazepam]   . Zetia [Ezetimibe]   . Penicillins Hives and Rash    Has patient had a PCN reaction causing immediate rash, facial/tongue/throat swelling, SOB or lightheadedness with hypotension: yes Has patient had a PCN reaction causing severe rash involving mucus membranes or skin necrosis: Unknown Has patient had a PCN reaction that  required hospitalization: No Has patient had a PCN reaction occurring within the last 10 years: No If all of the above answers are "NO", then may proceed with Cephalosporin use.     Consultations:  cardiology   Procedures/Studies: Dg Chest 2 View  Result Date: 05/15/2017 CLINICAL DATA:  Increased sob x 1 week. Has been doing neb treatments but they are not really helping. O2 sats at 92-93% on 3L when she was brought in by EMS EXAM: CHEST - 2 VIEW COMPARISON:  Chest radiograph 05/21/2012, CT 05/23/2012 FINDINGS: Sternotomy wires overlie normal cardiac silhouette. There is a moderate RIGHT effusion which is increased from prior (05/21/2012). No focal consolidation. LEFT lung relatively clear. There is fine interstitial pattern. No aggressive osseous lesion. IMPRESSION: Chronic unilateral RIGHT pleural effusion is increased from comparison exam 5 years prior. Mild interstitial edema. Electronically Signed   By:  Genevive BiStewart  Edmunds M.D.   On: 05/15/2017 16:12   [place] Chest (pleural Effusion)  Result Date: 05/16/2017 CLINICAL DATA:  Pleural effusion. EXAM: CHEST ULTRASOUND COMPARISON:  Chest x-ray 05/15/2017. FINDINGS: Small right pleural effusions noted. Pleural effusions not PICC enough for safe thoracentesis. Repeat evaluation can be obtained as needed. IMPRESSION: Small right pleural effusion.  Thoracentesis not performed. Electronically Signed   By: Maisie Fushomas  Register   On: 05/16/2017 15:12   Dg Chest Port 1 View  Result Date: 05/18/2017 CLINICAL DATA:  Shortness of breath EXAM: PORTABLE CHEST 1 VIEW COMPARISON:  Chest radiograph May 15, 2017 and chest CT May 23, 2012 FINDINGS: There is interstitial edema, more on the left than on the right with consolidation both lung bases. There are small pleural effusions bilaterally. Heart is mildly enlarged with pulmonary venous hypertension. Patient is status post median sternotomy. There is aortic atherosclerosis. No adenopathy. No evident bone lesions.  IMPRESSION: Interstitial edema with cardiomegaly and bilateral pleural effusions suspect a degree of underlying congestive heart failure. Consolidation in both lung bases, primarily medially, raises concern for pneumonia, although alveolar edema may present in this manner. Both entities may present concurrently. Electronically Signed   By: Bretta BangWilliam  Woodruff III M.D.   On: 05/18/2017 17:13        Discharge Exam: Vitals:   05/21/17 0828 05/21/17 1507  BP:    Pulse:    Resp:    Temp:    SpO2: 96% 93%   Vitals:   05/21/17 0604 05/21/17 0818 05/21/17 0828 05/21/17 1507  BP: 115/68     Pulse: 68     Resp: 16     Temp: 97.6 F (36.4 C)     TempSrc: Oral     SpO2: 93% 93% 96% 93%  Weight:      Height:        General: Pt is alert, awake, not in acute distress Cardiovascular: RRR, S1/S2 +, no rubs, no gallops Respiratory: CTA bilaterally, no wheezing, no rhonchi Abdominal: Soft, NT, ND, bowel sounds + Extremities: no edema, no cyanosis   The results of significant diagnostics from this hospitalization (including imaging, microbiology, ancillary and laboratory) are listed below for reference.    Significant Diagnostic Studies: Dg Chest 2 View  Result Date: 05/15/2017 CLINICAL DATA:  Increased sob x 1 week. Has been doing neb treatments but they are not really helping. O2 sats at 92-93% on 3L when she was brought in by EMS EXAM: CHEST - 2 VIEW COMPARISON:  Chest radiograph 05/21/2012, CT 05/23/2012 FINDINGS: Sternotomy wires overlie normal cardiac silhouette. There is a moderate RIGHT effusion which is increased from prior (05/21/2012). No focal consolidation. LEFT lung relatively clear. There is fine interstitial pattern. No aggressive osseous lesion. IMPRESSION: Chronic unilateral RIGHT pleural effusion is increased from comparison exam 5 years prior. Mild interstitial edema. Electronically Signed   By: Genevive BiStewart  Edmunds M.D.   On: 05/15/2017 16:12   [place] Chest (pleural  Effusion)  Result Date: 05/16/2017 CLINICAL DATA:  Pleural effusion. EXAM: CHEST ULTRASOUND COMPARISON:  Chest x-ray 05/15/2017. FINDINGS: Small right pleural effusions noted. Pleural effusions not PICC enough for safe thoracentesis. Repeat evaluation can be obtained as needed. IMPRESSION: Small right pleural effusion.  Thoracentesis not performed. Electronically Signed   By: Maisie Fushomas  Register   On: 05/16/2017 15:12   Dg Chest Port 1 View  Result Date: 05/18/2017 CLINICAL DATA:  Shortness of breath EXAM: PORTABLE CHEST 1 VIEW COMPARISON:  Chest radiograph May 15, 2017 and chest CT May 23, 2012 FINDINGS: There is interstitial edema, more on the left than on the right with consolidation both lung bases. There are small pleural effusions bilaterally. Heart is mildly enlarged with pulmonary venous hypertension. Patient is status post median sternotomy. There is aortic atherosclerosis. No adenopathy. No evident bone lesions. IMPRESSION: Interstitial edema with cardiomegaly and bilateral pleural effusions suspect a degree of underlying congestive heart failure. Consolidation in both lung bases, primarily medially, raises concern for pneumonia, although alveolar edema may present in this manner. Both entities may present concurrently. Electronically Signed   By: Bretta Bang III M.D.   On: 05/18/2017 17:13     Microbiology: No results found for this or any previous visit (from the past 240 hour(s)).   Labs: Basic Metabolic Panel: Recent Labs  Lab 05/17/17 0451 05/18/17 0624 05/19/17 0613 05/20/17 0549 05/21/17 0449  NA 142 142 145 141 141  K 4.1 3.8 4.1 3.8 3.1*  CL 95* 95* 98* 88* 86*  CO2 37* 35* 36* 40* 41*  GLUCOSE 125* 121* 199* 214* 186*  BUN 20 23* 26* 38* 48*  CREATININE 0.87 0.81 0.72 1.05* 0.99  CALCIUM 8.6* 8.6* 9.1 9.6 8.9  MG 2.0 1.8  --  2.0  --    Liver Function Tests: Recent Labs  Lab 05/15/17 1556 05/17/17 0451  AST 19 12*  ALT 10* 9*  ALKPHOS 81 80  BILITOT  0.9 1.1  PROT 6.8 6.4*  ALBUMIN 3.4* 3.2*   No results for input(s): LIPASE, AMYLASE in the last 168 hours. No results for input(s): AMMONIA in the last 168 hours. CBC: Recent Labs  Lab 05/15/17 1556 05/20/17 0549 05/21/17 0449  WBC 6.4 13.8* 13.0*  NEUTROABS 4.7  --   --   HGB 10.4* 10.9* 11.1*  HCT 35.3* 37.7 37.7  MCV 91.9 92.6 91.3  PLT 213 294 316   Cardiac Enzymes: Recent Labs  Lab 05/15/17 1600 05/15/17 1827 05/16/17 0024 05/16/17 0648  TROPONINI <0.03 <0.03 0.03* <0.03   BNP: Invalid input(s): POCBNP CBG: No results for input(s): GLUCAP in the last 168 hours.  Time coordinating discharge:  Greater than 30 minutes  Signed:  Catarina Hartshorn, DO [facility] Pager: [phone] 05/21/2017, 3:31 PM

## 2017-05-21 NOTE — Progress Notes (Signed)
Progress Note  Patient Name: Julie Burns Date of Encounter: 05/21/2017  Primary Cardiologist: Rollene Rotunda, MD   Subjective   Wants to go home birthday yesterday , cat needs attention and has grand daguther she wants to see   Inpatient Medications    Scheduled Meds: . amiodarone  200 mg Oral Daily  . apixaban  5 mg Oral BID  . budesonide  0.5 mg Nebulization BID  . cholecalciferol  5,000 Units Oral Daily  . furosemide  80 mg Intravenous BID  . gabapentin  100 mg Oral QHS  . ipratropium-albuterol  3 mL Nebulization TID  . levothyroxine  112.5 mcg Oral Once per day on Mon Thu  . levothyroxine  75 mcg Oral Once per day on Sun Tue Wed Fri Sat  . lisinopril  5 mg Oral Daily  . mouth rinse  15 mL Mouth Rinse BID  . methylPREDNISolone (SOLU-MEDROL) injection  60 mg Intravenous Q12H  . metoprolol succinate  25 mg Oral Daily  . polyethylene glycol  17 g Oral Daily  . sodium chloride flush  3 mL Intravenous Q12H   Continuous Infusions: . sodium chloride     PRN Meds: sodium chloride, acetaminophen, benzonatate, fluticasone, ipratropium-albuterol, LORazepam, magic mouthwash, nitroGLYCERIN, ondansetron (ZOFRAN) IV, ondansetron, promethazine, sodium chloride, sodium chloride flush   Vital Signs    Vitals:   05/21/17 0500 05/21/17 0604 05/21/17 0818 05/21/17 0828  BP:  115/68    Pulse:  68    Resp:  16    Temp:  97.6 F (36.4 C)    TempSrc:  Oral    SpO2:  93% 93% 96%  Weight: 213 lb 13.5 oz (97 kg)     Height:        Intake/Output Summary (Last 24 hours) at 05/21/2017 0848 Last data filed at 05/21/2017 0200 Gross per 24 hour  Intake 360 ml  Output 1450 ml  Net -1090 ml   Filed Weights   05/19/17 0500 05/20/17 0500 05/21/17 0500  Weight: 225 lb 15.5 oz (102.5 kg) 218 lb 14.7 oz (99.3 kg) 213 lb 13.5 oz (97 kg)    Telemetry    NSR with PVC's - Personally Reviewed  ECG      NSR no acute ST changes    Physical Exam   Affect appropriate Healthy:  appears  stated age Blind with reader at bedside  Neck supple with no adenopathy JVP normal no bruits no thyromegaly Lungs clear with no wheezing and good diaphragmatic motion Heart:  S1/S2 SEM through AVR no AR  murmur, no rub, gallop or click PMI normal Abdomen: benighn, BS positve, no tenderness, no AAA no bruit.  No HSM or HJR Distal pulses intact with no bruits Plus one bilateral edema Neuro non-focal Skin warm and dry No muscular weakness   Labs    Chemistry Recent Labs  Lab 05/15/17 1556  05/17/17 0451  05/19/17 0613 05/20/17 0549 05/21/17 0449  NA 140   < > 142   < > 145 141 141  K 4.2   < > 4.1   < > 4.1 3.8 3.1*  CL 96*   < > 95*   < > 98* 88* 86*  CO2 33*   < > 37*   < > 36* 40* 41*  GLUCOSE 106*   < > 125*   < > 199* 214* 186*  BUN 24*   < > 20   < > 26* 38* 48*  CREATININE 1.01*   < >  0.87   < > 0.72 1.05* 0.99  CALCIUM 8.7*   < > 8.6*   < > 9.1 9.6 8.9  PROT 6.8  --  6.4*  --   --   --   --   ALBUMIN 3.4*  --  3.2*  --   --   --   --   AST 19  --  12*  --   --   --   --   ALT 10*  --  9*  --   --   --   --   ALKPHOS 81  --  80  --   --   --   --   BILITOT 0.9  --  1.1  --   --   --   --   GFRNONAA 50*   < > 59*   < > >60 47* 51*  GFRAA 58*   < > >60   < > >60 55* 59*  ANIONGAP 11   < > 10   < > 11 13 14    < > = values in this interval not displayed.     Hematology Recent Labs  Lab 05/15/17 1556 05/20/17 0549 05/21/17 0449  WBC 6.4 13.8* 13.0*  RBC 3.84* 4.07 4.13  HGB 10.4* 10.9* 11.1*  HCT 35.3* 37.7 37.7  MCV 91.9 92.6 91.3  MCH 27.1 26.8 26.9  MCHC 29.5* 28.9* 29.4*  RDW 14.5 14.9 14.8  PLT 213 294 316    Cardiac Enzymes Recent Labs  Lab 05/15/17 1600 05/15/17 1827 05/16/17 0024 05/16/17 0648  TROPONINI <0.03 <0.03 0.03* <0.03   No results for input(s): TROPIPOC in the last 168 hours.   BNP Recent Labs  Lab 05/18/17 1830  BNP 420.0*     DDimer No results for input(s): DDIMER in the last 168 hours.   Radiology    No results  found.  Cardiac Studies   Echocardiogram: 05/08/2017 Study Conclusions   - Left ventricle: The cavity size was normal. Wall thickness was   increased in a pattern of mild LVH. Systolic function was   vigorous. The estimated ejection fraction was in the range of 65%   to 70%. Wall motion was normal; there were no regional wall   motion abnormalities. The study is not technically sufficient to   allow evaluation of LV diastolic function. - Aortic valve: Poorly visualized. A porcine bioprosthesis was   present. There was trivial regurgitation. Mean gradient (S): 19   mm Hg. - Mitral valve: Mildly calcified annulus. Mildly thickened,   moderately calcified leaflets . The findings are consistent with   mild stenosis. There was trivial regurgitation. Mean gradient   (D): 6 mm Hg. Valve area by pressure half-time: 1.61 cm^2. - Left atrium: The atrium was mildly dilated. - Right ventricle: The cavity size was mildly dilated. - Right atrium: Central venous pressure (est): 8 mm Hg. - Tricuspid valve: There was mild regurgitation. - Pulmonary arteries: Systolic pressure was moderately increased.   PA peak pressure: 48 mm Hg (S). - Pericardium, extracardiac: There was no pericardial effusion.     Patient Profile     84 y.o. female   w/ PMH of bioprosthetic AVR in 2010, CAD (s/p DES to RCA and staged PCI to LAD and LCx in 2015), PAF (s/p DCCV in 2017), carotid artery stenosis and chronic hypoxic respiratory failure admitted with acute on chronic diastolic CHF.   Assessment & Plan    Acute on chronic diastolic CHF recent echo  with normal LVEF, back to baseline d/c Home with lasix 40 bid outpatient f/u [facility] arranged   CAD status post DES to the RCA and staged PCI to the LAD and circumflex in 2015 no chest pain, troponins negative medical Rx especially given age and DNR status   Status post bioprosthetic AVR in 2010 echo with mean gradient of 19 mmHg stable   Chronic hypoxic respiratory  failure on 3 L O2 at home continue consider CXR f/u prior to d/c    PAF maintaining sinus rhythm on amiodarone and Eliquis  For questions or updates, please contact [facility] Please consult [email or web address] for contact info under Cardiology/STEMI.      Signed, Charlton Haws, MD  05/21/2017, 8:48 AM

## 2017-05-21 NOTE — Progress Notes (Signed)
Discharge instructions gone over with patient and her daughter, verbalized understanding. Patient is on continuous oxygen at home but left her portable tank at home. The unit director agreed to let the patient take one of our tanks home and bring it back at their earliest convenience. Patient verbalizes understanding of this agreement. DNR paper given to patient. IV removed, patient tolerated procedure well.

## 2017-05-21 NOTE — Care Management (Signed)
Patient Information   Patient Name Julie Burns, Julie Burns ([id number]) Sex Female DOB Jul 23, 1930  Room Bed  A306 A306-01  Patient Demographics   Address [address] [place] [id number] Phone [phone] (Home)  Patient Ethnicity & Race   Ethnic Group Patient Race  Not Hispanic or Latino White or Caucasian  Emergency Contact(s)   Name Relation Home Work Mobile  Hill,Roberta Larita Fife(Lynn) Daughter [phone]    Documents on File    Status Date Received Description  Documents for the Patient  [place] HIPAA NOTICE OF PRIVACY - Scanned Not Received    [facility] E-Signature HIPAA Notice of Privacy Unable to Obtain 05/21/12   Driver's License Not Received    Insurance Card Not Received  MEDICARE & [place] MEDICAID 2018  Advance Directives/Living Will/HCPOA/POA Not Received    Insurance Card Not Received    [place] E-Signature HIPAA Notice of Privacy [place] Received 05/26/12   Advanced Beneficiary Notice (ABN) Not Received    Other Photo ID Not Received    Insurance Card Received 10/15/16 Medicare / Medicaid 2018/tg  AMB Correspondence  09/17/16 CLINICAL NOTE HAWKINS MD, E  AMB Outside Hospital Record Received 12/24/16 05/17-08/18 [facility]  Release of Information Received 01/31/17 [facility]on Lenn[facility]my [facility] - ALL [facility] DPR  Documents for the Encounter  AOB (Assignment of Insurance Benefits) Not Received    E-signature AOB Signed 05/15/17   MEDICARE RIGHTS Not Received    E-signature Medicare Rights Signed 05/15/17   ED Patient Billing Extract   ED PB Billing Extract  Cardiac Monitoring Strip Received 05/15/17   Cardiac Monitoring Strip Shift Summary Received 05/16/17   EKG Received 05/16/17   Admission Information   Attending Provider Admitting Provider Admission Type Admission Date/Time  Tat, Onalee Huaavid, MD Haydee Monicaavid, Rachal A, MD Emergency 05/15/17 1420  Discharge Date Hospital Service Auth/Cert Status Service Area   Internal Medicine Incomplete [place]  SERVICE AREA  Unit Room/Bed Admission Status   AP-DEPT 300 A306/A306-01 Admission (Confirmed)   Admission   Complaint  ---  Hospital Account   Name Acct ID Class Status Primary Coverage  Julie Burns, Julie Burns [id number] Inpatient Open MEDICARE - MEDICARE PART A AND B      Guarantor Account (for Hospital Account [id number])   Name Relation to Pt Service Area Active? Acct Type  Mamie NickKallam, Julie Burns Self CHSA Yes Personal/Family  Address Phone    [phone] [place], [place] [address] [phone](Burns)        Coverage Information (for Hospital Account [id number])   1. MEDICARE/MEDICARE PART A AND B   F/O Payor/Plan Precert #  MEDICARE/MEDICARE PART A AND B   Subscriber Subscriber #  Julie Burns, Julie Burns [id number]  Address Phone  [address] [place], [place] [phone]   2. MEDICAID [place]/MEDICAID [place] ACCESS   F/O Payor/Plan Precert #  MEDICAID [place]/MEDICAID  ACCESS   Subscriber Subscriber #  Julie Burns, Julie Burns [id number]  Address Phone  [address] [address] [place], [place] [address]

## 2017-05-21 NOTE — Discharge Instructions (Signed)
1. Follow up with PCP in 1-2 weeks 2. Please obtain BMP/CBC in one week 3. Please follow up on the following pending results:

## 2017-05-21 NOTE — Care Management Note (Addendum)
Case Management Note  Patient Details  Name: BRISA AUTH MRN: [id number] Date of Birth: [birth year]     Expected Discharge Date:      05/21/2017            Expected Discharge Plan:  [place]  In-House Referral:     Discharge planning Services  CM Consult  Post Acute Care Choice:  Home Health Choice offered to:  Patient  DME Arranged:    DME Agency:     HH Arranged:  PT, RN [place] Agency:  [place]  Status of Service:  Completed, signed off  If discussed at [place] of Stay Meetings, dates discussed:    Additional Comments: Patient discharging home today per her "request".  She is still in agreement with home health with [facility]. Will add [facility] services to increase HH visists as patient is at risk for readmission.  She has met with Palliative NP, family meeting again today at 2 pm. At this time, no referral to hospice necessary.  Briefly discussed PACE program with patient and provided brochure.    Anaija Wissink, Chauncey Reading, RN 05/21/2017, 11:52 AM

## 2017-05-21 NOTE — Progress Notes (Signed)
Physical Therapy Treatment Patient Details Name: Julie Burns MRN: [id number] DOB: [birth year] Today's Date: 05/21/2017    History of Present Illness CIIN BRAZZEL is a 84 y.o. female with medical history significant of A. fib on Eliquis, congestive heart failure, status post aortic valve replacement in 2010, coronary artery disease status post stenting comes in with progressive shortness of breath for several weeks.  She also reports some lower extremity edema.  Patient reports her Lasix was decreased about a month ago by her cardiologist.  Since that time she feels like she has been gaining weight.  She denies any fevers or coughing.  She has been getting very dyspneic on exertion.  She chronically is on 2 L of oxygen at home and she is been requiring 3 recently.  Patient is referred for admission for worsening pleural effusion on the right.  Patient was previously on Lasix 40 mg p.o. twice a day this is recently been decreased to 40 mg daily.    PT Comments    Pt with improved oxygen saturation through session, pt able to hold conversations with O2 sat 91-95%.  Decreased saturation with gait training.  Multiple rest breaks (4 through session) per fatigue and to increase saturation with diaphragmatic breathing instructions to assist.  EOS with pt left in chair with call bell within reach and LE elevated to assist with edema control.  No reports of pain through sesison.     Follow Up Recommendations  Home health PT;Supervision - Intermittent     Equipment Recommendations  None recommended by PT    Recommendations for Other Services       Precautions / Restrictions Precautions Precautions: Fall Restrictions Weight Bearing Restrictions: No    Mobility  Bed Mobility               General bed mobility comments: Pt sitting EOB at PTA entrance  Transfers Overall transfer level: Modified independent Equipment used: Rolling walker (2 wheeled) Transfers: Sit to/from Stand Sit to  Stand: Supervision         General transfer comment: Improved techniques and no decreased O2 sat% with sit to stands, did required multiple seated rest breaks to improved O2 sat% and fatigue through session  Ambulation/Gait Ambulation/Gait assistance: Supervision Ambulation Distance (Feet): 36 Feet Assistive device: Rolling walker (2 wheeled) Gait Pattern/deviations: Decreased step length - right;Decreased step length - left;Decreased stride length     General Gait Details: 4 trips due to fatigue and pt thinking she needed restroom break through session (2 attempts).  Lateral breathing, slow cadence, no LOB episodes.  Used 4L O2 aide through gait trianing wiht range of 86-95%.   Stairs            Wheelchair Mobility    Modified Rankin (Stroke Patients Only)       Balance                                            Cognition Arousal/Alertness: Awake/alert Behavior During Therapy: WFL for tasks assessed/performed Overall Cognitive Status: Within Functional Limits for tasks assessed                                        Exercises      General Comments  Pertinent Vitals/Pain Pain Assessment: No/denies pain    Home Living                      Prior Function            PT Goals (current goals can now be found in the care plan section) Progress towards PT goals: Progressing toward goals    Frequency    Min 3X/week      PT Plan Current plan remains appropriate    Co-evaluation              AM-PAC PT "6 Clicks" Daily Activity  Outcome Measure  Difficulty turning over in bed (including adjusting bedclothes, sheets and blankets)?: None Difficulty moving from lying on back to sitting on the side of the bed? : None Difficulty sitting down on and standing up from a chair with arms (e.g., wheelchair, bedside commode, etc,.)?: None Help needed moving to and from a bed to chair (including a  wheelchair)?: A Little Help needed walking in hospital room?: A Little Help needed climbing 3-5 steps with a railing? : A Little 6 Click Score: 21    End of Session Equipment Utilized During Treatment: Gait belt Activity Tolerance: Patient tolerated treatment well;Patient limited by fatigue Patient left: in chair;with call bell/phone within reach Nurse Communication: Mobility status;Other (comment) PT Visit Diagnosis: Unsteadiness on feet (R26.81);Other abnormalities of gait and mobility (R26.89);Muscle weakness (generalized) (M62.81)     Time: 1020-1100 PT Time Calculation (min) (ACUTE ONLY): 40 min  Charges:  $Therapeutic Activity: 23-37 mins                    G Codes:       Becky SaxCasey Jabir Dahlem, LPTA; CBIS [phone]  Juel BurrowCockerham, Jovante Hammitt Jo 05/21/2017, 12:40 PM

## 2017-05-21 NOTE — Progress Notes (Signed)
Daily Progress Note   Patient Name: Julie Burns       Date: 05/21/2017 DOB: Jul 11, 1930  Age: 84 y.o. MRN#: [id number] Attending Physician: Catarina Hartshornat, David, MD Primary Care Physician: Joette CatchingNyland, Leonard, MD Admit Date: 05/15/2017  Reason for Consultation/Follow-up: Establishing goals of care and Psychosocial/spiritual support  Subjective: [name] is sitting quietly in the [place] chair in her room.  She greets me making and keeping eye contact.  Her daughter Julie Burns is at bedside.  They are preparing for discharge.   We talked about the treatment plan.  I share that nursing staff will come with discharge instructions soon.   We also talked about hospice referral for surveillance.  Julie Burns and [name] are requesting for her [facility] to [facility]-up.  Case management notified.  Conference with hospitalist, Dr. Arbutus Leasat related to plan of care and disposition.  Length of Stay: 6  Current Medications: Scheduled Meds:  . amiodarone  200 mg Oral Daily  . apixaban  5 mg Oral BID  . budesonide  0.5 mg Nebulization BID  . cholecalciferol  5,000 Units Oral Daily  . furosemide  80 mg Intravenous BID  . gabapentin  100 mg Oral QHS  . ipratropium-albuterol  3 mL Nebulization TID  . levothyroxine  112.5 mcg Oral Once per day on Mon Thu  . levothyroxine  75 mcg Oral Once per day on Sun Tue Wed Fri Sat  . lisinopril  5 mg Oral Daily  . mouth rinse  15 mL Mouth Rinse BID  . methylPREDNISolone (SOLU-MEDROL) injection  60 mg Intravenous Q12H  . metoprolol succinate  25 mg Oral Daily  . polyethylene glycol  17 g Oral Daily  . sodium chloride flush  3 mL Intravenous Q12H    Continuous Infusions: . sodium chloride      PRN Meds: sodium chloride, acetaminophen,  benzonatate, fluticasone, ipratropium-albuterol, LORazepam, magic mouthwash, nitroGLYCERIN, ondansetron (ZOFRAN) IV, ondansetron, promethazine, sodium chloride, sodium chloride flush  Physical Exam  Constitutional: She is oriented to person, place, and time. She does not appear ill.  Makes and keeps eye contact, appears chronically ill  HENT:  Head: Atraumatic.  Cardiovascular: Normal rate.  Pulmonary/Chest: Effort normal. No respiratory distress.  Abdominal: Soft. She exhibits no distension.  Musculoskeletal:  Right lower leg: She exhibits edema.       Left lower leg: She exhibits edema.  Neurological: She is alert and oriented to person, place, and time.  Skin: Skin is warm and dry.  Psychiatric: Her mood appears not anxious. She is not agitated.  Nursing note and vitals reviewed.           Vital Signs: BP 115/68 (BP Location: Right Arm)   Pulse 68   Temp 97.6 F (36.4 C) (Oral)   Resp 16   Ht 5\' 6"  (1.676 m)   Wt 97 kg (213 lb 13.5 oz)   SpO2 96%   BMI 34.52 kg/m  SpO2: SpO2: 96 % O2 Device: O2 Device: (nebulizer) O2 Flow Rate: O2 Flow Rate (L/min): 4 L/min  Intake/output summary:   Intake/Output Summary (Last 24 hours) at 05/21/2017 1452 Last data filed at 05/21/2017 1610 Gross per 24 hour  Intake 600 ml  Output 1400 ml  Net -800 ml   LBM: Last BM Date: 05/20/17 Baseline Weight: Weight: 104.3 kg (230 lb) Most recent weight: Weight: 97 kg (213 lb 13.5 oz)       Palliative Assessment/Data:    Flowsheet Rows     Most Recent Value  Intake Tab  Referral Department  Hospitalist  Unit at Time of Referral  Cardiac/Telemetry Unit  Palliative Care Primary Diagnosis  Cardiac  Date Notified  05/20/17  Palliative Care Type  New Palliative care  Reason for referral  Clarify Goals of Care  Date of Admission  05/15/17  Date first seen by Palliative Care  05/20/17  # of days Palliative referral response time  0 Day(s)  # of days IP prior to Palliative referral  5    Clinical Assessment  Palliative Performance Scale Score  30%  Pain Max last 24 hours  Not able to report  Pain Min Last 24 hours  Not able to report  Dyspnea Max Last 24 Hours  Not able to report  Dyspnea Min Last 24 hours  Not able to report  Psychosocial & Spiritual Assessment  Palliative Care Outcomes  Patient/Family meeting held?  Yes  Who was at the meeting?  Patient and daughter Julie Fife at bedside  Palliative Care Outcomes  Counseled regarding hospice, Provided advance care planning, Provided psychosocial or spiritual support, Clarified goals of care  Patient/Family wishes: Interventions discontinued/not started   Mechanical Ventilation      Patient Active Problem List   Diagnosis Date Noted  . Palliative care by specialist   . Goals of care, counseling/discussion   . Encounter for hospice care discussion   . Pleural effusion, right   . COPD with acute exacerbation (HCC) 05/18/2017  . Acute diastolic CHF (congestive heart failure) (HCC) 05/17/2017  . Chronic respiratory failure with hypoxia (HCC) 05/16/2017  . Acute CHF (congestive heart failure) (HCC) 05/16/2017  . Essential hypertension 05/16/2017  . CHF (congestive heart failure) (HCC) 05/15/2017  . COPD (chronic obstructive pulmonary disease) (HCC) 05/15/2017  . Acute on chronic respiratory failure with hypoxia (HCC) 05/15/2017  . Pleural effusion on right 05/15/2017  . SOB (shortness of breath)   . Coronary artery disease involving native coronary artery of native heart without angina pectoris 01/31/2017  . Stenosis of carotid artery 01/31/2017  . Obstructive chronic bronchitis with exacerbation (HCC) 06/15/2012  . Pain in joint, shoulder region 05/27/2012  . CAD (coronary artery disease) of artery bypass graft 05/27/2012  . COPD exacerbation (HCC) 05/27/2012  . Aortic valve disorders 05/27/2012  . A-fib (  HCC) 05/27/2012    Palliative Care Assessment & Plan   Patient Profile:  84 y.o. female  with past medical  history of extensive coronary history including atrial fibrillation, coronary artery disease with an end STEMI and stenting in 2015, carotid endarterectomy 2013, high blood pressure and cholesterol, aortic valve replacement in 2010, heart failure, peripheral vascular disease, history of smoking approximately 24 years, diabetes, irritable bowel syndrome, osteoporosis, chronic kidney disease history, mastectomy for breast cancer in 2011, history of multiple hernia surgeries admitted on 05/15/2017 with acute on chronic heart failure.  Assessment: acute on chronic heart failure: Diuresed, followed by cardiology, some improvement.  Low-salt diet, fluid restrictions discussed.  Recommendations/Plan:  Home with home health services.  Consultation with [facility] for surveillance.  Cap aid already in place.   Goals of Care and Additional Recommendations:  Limitations on Scope of Treatment: Treat the treatable but no CPR, no intubation.  Code Status:    Code Status Orders  (From admission, onward)        Start     Ordered   05/18/17 1731  Do not attempt resuscitation (DNR)  Continuous    Question Answer Comment  In the event of cardiac or respiratory ARREST Do not call a "code blue"   In the event of cardiac or respiratory ARREST Do not perform Intubation, CPR, defibrillation or ACLS   In the event of cardiac or respiratory ARREST Use medication by any route, position, wound care, and other measures to relive pain and suffering. May use oxygen, suction and manual treatment of airway obstruction as needed for comfort.      05/18/17 1730    Code Status History    Date Active Date Inactive Code Status Order ID Comments User Context   05/15/2017 18:21 05/18/2017 17:30 Full Code [id number]  Haydee Monica, MD ED       Prognosis:   < 6 months or less would not be surprising based on frailty, functional status,.   Discharge Planning:  [facility] [place] [facility] for  surveillance, home with home health  Care plan was discussed with nursing staff, case management, Dr. Arbutus Leas.  Thank you for allowing the Palliative Medicine Team to assist in the care of this patient.   Time In: 1400 Time Out: 1440 Total Time 40 minutes Prolonged Time Billed  no       Greater than 50%  of this time was spent counseling and coordinating care related to the above assessment and plan.  Katheran Awe, NP  Please contact Palliative Medicine Team phone at [phone] for questions and concerns.

## 2017-05-21 NOTE — Care Management (Signed)
After family meeting with Palliative NP, patient would like [facility]. Discussed with daughter.   She will still have HH with [facility] services. Will need WC. Olegario [facility]Kathy of [facility] notified and will deliver to room prior to DC.

## 2017-05-27 ENCOUNTER — Ambulatory Visit (HOSPITAL_COMMUNITY): Admission: RE | Admit: 2017-05-27 | Payer: Medicare Other | Source: Ambulatory Visit

## 2017-11-11 NOTE — Progress Notes (Signed)
Cardiology Office Note   Date:  11/13/2017   ID:  Julie Burns, DOB 30-Mar-1930, MRN [id number]  PCP:  Joette Catching, MD  Cardiologist:   Rollene Rotunda, MD    Chief Complaint  Patient presents with  . Atrial Fibrillation      History of Present Illness: Julie Burns is a 84 y.o. female who presents for follow-up of CHF and atrial fibrillation.  She saw Dr.Nishan earlier this year.  She was previously followed at [facility].  She had AVR in 2010 (21mm Magna valve)and PAF on anticoagulation and amiodarone.  Of note she had a cardioversion in 2017 with post cardioversion pause requiring atropine.  She also has CAD with DES to the RCA staged PCI to the LAD and circumflex in 2015.  She was thought not to be a CABG candidate.  Since I last saw her she had a hospitalization for acute on chronic diastolic dysfunction and COPD flair.   I did review the hospital records for this appointment.  I also looked at blood work in [employer].  Since that visit she says she is done relatively well.  Denies any acute cardiovascular symptoms.  She is on oxygen 24/7.  She denies chest pressure, neck or arm discomfort.  He has had no new palpitations, presyncope or syncope.  She denies any PND or orthopnea.  She gets around slowly with a walker.  She has to rest if she goes out to do anything but this has been chronic.   Past Medical History:  Diagnosis Date  . Age-related osteoporosis without current pathological fracture   . Anxiety disorder   . Atrial fibrillation (HCC)   . CAD (coronary artery disease)    12/2013-DES to RCA due to NSTEMI- staged PCI to LAD and CX (not ideal for CABG; Echocardiogram 01/16/10 EF 55%, severe LVH; Moderate AS; mild ot moderate TR  . Chronic kidney disease   . Chronic respiratory failure with hypoxia (HCC)   . COPD (chronic obstructive pulmonary disease) (HCC)   . Degenerative myopia with macular hole   . Diabetes (HCC)    TYPE 2  . Hyperlipidemia   .  Hypothyroidism   . Irritable bowel syndrome without diarrhea   . NSTEMI (non-ST elevated myocardial infarction) (HCC)    NSTEMI 3/05; s/p PTCA with stenting of left circumflex coronary artery 3/05; totally occluded small vessel OM  . Occlusion and stenosis of right carotid artery    Carotid Duplex 7/13 moderate disease (<70%) of the right internal carotid arteries, this is a high bifurcation, s/p left CEA without significant stenosis.  . Polyp of colon   . Presence of prosthetic heart valve    S/P aortic valve replacement 04/29/2008   . PVD (peripheral vascular disease) (HCC)   . Sicca syndrome (HCC)   . Stroke (cerebrum) (HCC)   . Unspecified abdominal hernia without obstruction or gangrene   . Vitamin D deficiency     Past Surgical History:  Procedure Laterality Date  . AORTIC VALVE REPLACEMENT     S/P aortic valve replacement 04/29/2008   . CARDIAC CATHETERIZATION     by Flonnie Hailstone at [facility]; mLAD 30%, patent LCx stent, mRCA 50%, LVEDP 10, severe AS with AV mean gradient 46 mmHg, Right heart catheterization   . CAROTID ENDARTERECTOMY     Carotid Duplex 7/13 moderate disease (<70%) of the right internal carotid arteries, this is a high bifurcation, s/p left CEA without significant stenosis.     Current Outpatient Medications  Medication Sig Dispense Refill  . alendronate (FOSAMAX) 70 MG tablet Take 70 mg by mouth once a week. Take with a full glass of water on an empty stomach. Take on Sunday's.    . amiodarone (PACERONE) 200 MG tablet Take 200 mg by mouth daily.    [name] apixaban (ELIQUIS) 5 MG TABS tablet Take 5 mg by mouth 2 (two) times daily.    . benzonatate (TESSALON) 200 MG capsule Take 200 mg by mouth 3 (three) times daily as needed for cough.    . fluticasone (FLONASE) 50 MCG/ACT nasal spray Place 2 sprays into both nostrils daily as needed for allergies.     . furosemide (LASIX) 40 MG tablet Take 1 tablet (40 mg total) by mouth 2 (two) times daily. 30 tablet 1  . gabapentin  (NEURONTIN) 100 MG capsule 100 mg. Take one tablet by mouth in the morning and afternoon and take two tablets at bedtime    . Ipratropium-Albuterol (COMBIVENT RESPIMAT) 20-100 MCG/ACT AERS respimat Inhale 1-2 puffs into the lungs every 4 (four) hours as needed for wheezing or shortness of breath.    [name] ipratropium-albuterol (DUONEB) 0.5-2.5 (3) MG/3ML SOLN Take 3 mLs by nebulization every 6 (six) hours as needed.    [name] levothyroxine (SYNTHROID, LEVOTHROID) 75 MCG tablet 75 mcg. Take one tablet daily by mouth except Mon. And Thurs. Take 1 1/2 tablets    . lisinopril (PRINIVIL,ZESTRIL) 20 MG tablet Take 20 mg by mouth daily.    [name] LORazepam (ATIVAN) 0.5 MG tablet Take 0.5 mg by mouth 2 (two) times daily as needed for anxiety or sleep.     . magic mouthwash SOLN Take 5 mLs by mouth daily as needed for mouth pain (thrush).    . Melatonin-Pyridoxine (MELATONIN TR WITH VITAMIN B6) 3-10 MG TBCR Take 1 capsule by mouth at bedtime as needed (sleep).     . metoprolol succinate (TOPROL-XL) 100 MG 24 hr tablet Take 50 mg by mouth daily. Takes 50 mg tablet daily. If blood pressure is elevated take additional 50 mg tablet.    . Multiple Vitamins-Minerals (ICAPS AREDS FORMULA PO) Take 1 capsule by mouth 2 (two) times daily.     . nitroGLYCERIN (NITROSTAT) 0.4 MG SL tablet Place 0.4 mg under the tongue every 5 (five) minutes x 3 doses as needed for chest pain.    [name] nystatin-triamcinolone ointment (MYCOLOG) Apply 1 application topically 2 (two) times daily as needed (rash).     . ondansetron (ZOFRAN) 4 MG tablet Take 4 mg by mouth every 8 (eight) hours as needed for nausea or vomiting.    . polyethylene glycol (MIRALAX / GLYCOLAX) packet Take 17 g by mouth daily.    . promethazine (PHENERGAN) 12.5 MG tablet Take 12.5 mg by mouth every 6 (six) hours as needed for nausea or vomiting.    . ranitidine (ZANTAC) 150 MG tablet Take 150 mg by mouth 2 (two) times daily.    . traMADol (ULTRAM) 50 MG tablet Take 50 mg by mouth  every 6 (six) hours as needed for moderate pain.     . budesonide (PULMICORT) 0.5 MG/2ML nebulizer solution Take 0.5 mg by nebulization 2 (two) times daily.    . Cholecalciferol (VITAMIN D3) 5000 units CAPS Take 1 capsule by mouth daily.     No current facility-administered medications for this visit.     Allergies:   Bee venom; Codeine; Morphine and related; Norvasc [amlodipine besylate]; Sulfa antibiotics; Valium [diazepam]; Zetia [ezetimibe]; and Penicillins 1  ROS:  Please see the history of present illness.   Otherwise, review of systems are positive for none.   All other systems are reviewed and negative.    PHYSICAL EXAM: VS:  BP (!) 152/58   Pulse (!) 52   Ht 5' 6.5" (1.689 m)   Wt 208 lb (94.3 kg)   BMI 33.07 kg/m  , BMI Body mass index is 33.07 kg/m.  GENERAL: Slightly frail appearing NECK:  No jugular venous distention, waveform within normal limits, carotid upstroke brisk and symmetric, bilateral bruits, no thyromegaly LUNGS:  Clear to auscultation bilaterally CHEST:  Well healed sternotomy scar.  Left mastectomy HEART:  PMI not displaced or sustained,S1 and S2 within normal limits, no S3, no S4, no clicks, no rubs, grade 2 out of 6 apical systolic murmur, no diastolic murmurs ABD:  Flat, positive bowel sounds normal in frequency in pitch, no bruits, no rebound, no guarding, no midline pulsatile mass, no hepatomegaly, no splenomegaly EXT:  2 plus pulses throughout, no edema, no cyanosis no clubbing, varicose veins, trace edema   EKG:  EKG is  ordered today. The ekg ordered today demonstrates sinus rhythm, rate 59, axis within normal limits, intervals within normal limits, no acute ST-T wave changes.   Recent Labs: 05/17/2017: ALT 9 05/18/2017: B Natriuretic Peptide 420.0 05/20/2017: Magnesium 2.0 05/21/2017: BUN 48; Creatinine, Ser 0.99; Hemoglobin 11.1; Platelets 316; Potassium 3.1; Sodium 141    Lipid Panel No results found for: CHOL, TRIG, HDL, CHOLHDL, VLDL,  LDLCALC, LDLDIRECT    Wt Readings from Last 3 Encounters:  11/13/17 208 lb (94.3 kg)  05/21/17 213 lb 13.5 oz (97 kg)  05/08/17 230 lb (104.3 kg)      Other studies Reviewed: Additional studies/ records that were reviewed today include: Hospital records, Primary care labs in Care Everywhere Review of the above records demonstrates:     ASSESSMENT AND PLAN:    ATRIAL FIB:   She is had no symptomatic paroxysms.  She seems to be up-to-date with follow-up.  She had liver enzymes earlier this year.  She should get a TSH when she sees her primary provider and I discussed this with her.  She thinks she will be having blood work.  She tolerates anticoagulation.  CAD:   She is had no chest pain.  No further testing is indicated.  She was not thought to be a bypass candidate in the past.   AVR: She had an echocardiogram in March and she had stable valve replacement no change in therapy is indicated.  I reviewed these results.  CEA:  Status post left CEA.  She had moderate right stenosis.   She will have follow-up with this in December and we have made sure to schedule this.    DYSLIPIDEMIA: Her LDL was 151 with an HDL of 49.  She does not want therapy for this however.  Current medicines are reviewed at length with the patient today.  The patient does not have concerns regarding medicines.  The following changes have been made:  None  Labs/ tests ordered today include: None Orders Placed This Encounter  Procedures  . EKG 12-Lead     Disposition:   FU with me in 12 months.     Signed, Rollene Rotunda, MD  11/13/2017 12:29 PM    [facility]

## 2017-11-13 ENCOUNTER — Encounter: Payer: Self-pay | Admitting: Cardiology

## 2017-11-13 ENCOUNTER — Ambulatory Visit (INDEPENDENT_AMBULATORY_CARE_PROVIDER_SITE_OTHER): Payer: Medicare Other | Admitting: Cardiology

## 2017-11-13 VITALS — BP 152/58 | HR 52 | Ht 66.5 in | Wt 208.0 lb

## 2017-11-13 DIAGNOSIS — Z952 Presence of prosthetic heart valve: Secondary | ICD-10-CM

## 2017-11-13 DIAGNOSIS — I6523 Occlusion and stenosis of bilateral carotid arteries: Secondary | ICD-10-CM | POA: Insufficient documentation

## 2017-11-13 DIAGNOSIS — I6529 Occlusion and stenosis of unspecified carotid artery: Secondary | ICD-10-CM

## 2017-11-13 DIAGNOSIS — I480 Paroxysmal atrial fibrillation: Secondary | ICD-10-CM | POA: Diagnosis not present

## 2017-11-13 DIAGNOSIS — I25118 Atherosclerotic heart disease of native coronary artery with other forms of angina pectoris: Secondary | ICD-10-CM

## 2017-11-13 DIAGNOSIS — E785 Hyperlipidemia, unspecified: Secondary | ICD-10-CM

## 2017-11-13 NOTE — Patient Instructions (Signed)
Medication Instructions:  The current medical regimen is effective;  continue present plan and medications.  Testing/Procedures: Your physician has requested that you have a carotid duplex in December   You will be contacted to be scheduled for this.  This test is an ultrasound of the carotid arteries in your neck. It looks at blood flow through these arteries that supply the brain with blood. Allow one hour for this exam. There are no restrictions or special instructions.  Follow-Up: Follow up in 1 year with Dr. Antoine Poche.  You will receive a letter in the mail 2 months before you are due.  Please call us when you receive this letter to schedule your follow up appointment.  If you need a refill on your cardiac medications before your next appointment, please call your pharmacy.  Thank you for choosing [facility]!!

## 2017-11-22 ENCOUNTER — Other Ambulatory Visit: Payer: Self-pay | Admitting: *Deleted

## 2017-11-22 DIAGNOSIS — I6529 Occlusion and stenosis of unspecified carotid artery: Secondary | ICD-10-CM

## 2018-03-31 ENCOUNTER — Emergency Department (HOSPITAL_COMMUNITY)
Admission: EM | Admit: 2018-03-31 | Discharge: 2018-03-31 | Disposition: A | Discharge status: Home / Self Care | Payer: Medicare Other | Attending: Emergency Medicine | Admitting: Emergency Medicine

## 2018-03-31 ENCOUNTER — Other Ambulatory Visit: Payer: Self-pay

## 2018-03-31 ENCOUNTER — Encounter (HOSPITAL_COMMUNITY): Payer: Self-pay | Admitting: Emergency Medicine

## 2018-03-31 DIAGNOSIS — I2510 Atherosclerotic heart disease of native coronary artery without angina pectoris: Secondary | ICD-10-CM | POA: Insufficient documentation

## 2018-03-31 DIAGNOSIS — R040 Epistaxis: Secondary | ICD-10-CM | POA: Insufficient documentation

## 2018-03-31 DIAGNOSIS — I5031 Acute diastolic (congestive) heart failure: Secondary | ICD-10-CM | POA: Insufficient documentation

## 2018-03-31 DIAGNOSIS — E1122 Type 2 diabetes mellitus with diabetic chronic kidney disease: Secondary | ICD-10-CM | POA: Diagnosis not present

## 2018-03-31 DIAGNOSIS — Z79899 Other long term (current) drug therapy: Secondary | ICD-10-CM | POA: Insufficient documentation

## 2018-03-31 DIAGNOSIS — Z87891 Personal history of nicotine dependence: Secondary | ICD-10-CM | POA: Diagnosis not present

## 2018-03-31 DIAGNOSIS — J449 Chronic obstructive pulmonary disease, unspecified: Secondary | ICD-10-CM | POA: Diagnosis not present

## 2018-03-31 DIAGNOSIS — I130 Hypertensive heart and chronic kidney disease with heart failure and stage 1 through stage 4 chronic kidney disease, or unspecified chronic kidney disease: Secondary | ICD-10-CM | POA: Diagnosis not present

## 2018-03-31 DIAGNOSIS — N189 Chronic kidney disease, unspecified: Secondary | ICD-10-CM | POA: Diagnosis not present

## 2018-03-31 MED ORDER — SODIUM CHLORIDE 0.9 % IV BOLUS: 500.0000 mL | Freq: Once | INTRAVENOUS | Status: DC

## 2018-03-31 NOTE — ED Notes (Signed)
Assisted pt to BR and to bed.  Pt states she takes lasix.

## 2018-03-31 NOTE — Discharge Instructions (Addendum)
Do not take your blood thinner tomorrow morning.  Follow-up with your family doctor tomorrow.  Return to the emergency department if bleeding reoccurs and will not stop in a few minutes.

## 2018-03-31 NOTE — ED Provider Notes (Signed)
[facility]ERGENCY DEPARTMENT Provider Note   CSN: [id number] Arrival date & time: 03/31/18  1948     History   Chief Complaint Chief Complaint  Patient presents with  . Epistaxis    HPI Julie Burns is a 85 y.o. female.  Patient had a nosebleed today.  It lasted supposedly 30 minutes.  She is not bleeding now.  Patient takes Eliquis  The history is provided by the patient. No language interpreter was used.  Epistaxis  Location:  L nare Severity:  Moderate Duration:  30 minutes Timing:  Intermittent Progression:  Resolved Chronicity:  New Context: anticoagulants   Relieved by:  Nothing Worsened by:  Nothing Associated symptoms: no blood in oropharynx, no congestion, no cough and no headaches   Risk factors: no alcohol use     Past Medical History:  Diagnosis Date  . Age-related osteoporosis without current pathological fracture   . Anxiety disorder   . Atrial fibrillation (HCC)   . CAD (coronary artery disease)    12/2013-DES to RCA due to NSTEMI- staged PCI to LAD and CX (not ideal for CABG; Echocardiogram 01/16/10 EF 55%, severe LVH; Moderate AS; mild ot moderate TR  . Chronic kidney disease   . Chronic respiratory failure with hypoxia (HCC)   . COPD (chronic obstructive pulmonary disease) (HCC)   . Degenerative myopia with macular hole   . Diabetes (HCC)    TYPE 2  . Hyperlipidemia   . Hypothyroidism   . Irritable bowel syndrome without diarrhea   . NSTEMI (non-ST elevated myocardial infarction) (HCC)    NSTEMI 3/05; s/p PTCA with stenting of left circumflex coronary artery 3/05; totally occluded small vessel OM  . Occlusion and stenosis of right carotid artery    Carotid Duplex 7/13 moderate disease (<70%) of the right internal carotid arteries, this is a high bifurcation, s/p left CEA without significant stenosis.  . Polyp of colon   . Presence of prosthetic heart valve    S/P aortic valve replacement 04/29/2008   . PVD (peripheral vascular disease)  (HCC)   . Sicca syndrome (HCC)   . Stroke (cerebrum) (HCC)   . Unspecified abdominal hernia without obstruction or gangrene   . Vitamin D deficiency     Patient Active Problem List   Diagnosis Date Noted  . S/P AVR 11/13/2017  . Dyslipidemia 11/13/2017  . Bilateral carotid artery stenosis 11/13/2017  . Palliative care by specialist   . Goals of care, counseling/discussion   . Encounter for hospice care discussion   . Pleural effusion, right   . COPD with acute exacerbation (HCC) 05/18/2017  . Acute diastolic CHF (congestive heart failure) (HCC) 05/17/2017  . Chronic respiratory failure with hypoxia (HCC) 05/16/2017  . Acute CHF (congestive heart failure) (HCC) 05/16/2017  . Essential hypertension 05/16/2017  . CHF (congestive heart failure) (HCC) 05/15/2017  . COPD (chronic obstructive pulmonary disease) (HCC) 05/15/2017  . Acute on chronic respiratory failure with hypoxia (HCC) 05/15/2017  . Pleural effusion on right 05/15/2017  . SOB (shortness of breath)   . Coronary artery disease involving native coronary artery of native heart without angina pectoris 01/31/2017  . Stenosis of carotid artery 01/31/2017  . Obstructive chronic bronchitis with exacerbation (HCC) 06/15/2012  . Pain in joint, shoulder region 05/27/2012  . CAD (coronary artery disease) of artery bypass graft 05/27/2012  . COPD exacerbation (HCC) 05/27/2012  . Aortic valve disorders 05/27/2012  . A-fib (HCC) 05/27/2012    Past Surgical History:  Procedure Laterality Date  .  AORTIC VALVE REPLACEMENT     S/P aortic valve replacement 04/29/2008   . CARDIAC CATHETERIZATION     by Flonnie Hailstone at [facility]; mLAD 30%, patent LCx stent, mRCA 50%, LVEDP 10, severe AS with AV mean gradient 46 mmHg, Right heart catheterization   . CAROTID ENDARTERECTOMY     Carotid Duplex 7/13 moderate disease (<70%) of the right internal carotid arteries, this is a high bifurcation, s/p left CEA without significant stenosis.     OB  History   No obstetric history on file.      Home Medications    Prior to Admission medications   Medication Sig Start Date End Date Taking? Authorizing Provider  alendronate (FOSAMAX) 70 MG tablet Take 70 mg by mouth once a week. Take with a full glass of water on an empty stomach. Take on Sunday's.    [provider]  amiodarone (PACERONE) 200 MG tablet Take 200 mg by mouth daily.    [provider]  apixaban (ELIQUIS) 5 MG TABS tablet Take 5 mg by mouth 2 (two) times daily.    [provider]  benzonatate (TESSALON) 200 MG capsule Take 200 mg by mouth 3 (three) times daily as needed for cough.    [provider]  budesonide (PULMICORT) 0.5 MG/2ML nebulizer solution Take 0.5 mg by nebulization 2 (two) times daily.    [provider]  Cholecalciferol (VITAMIN D3) 5000 units CAPS Take 1 capsule by mouth daily.    [provider]  fluticasone (FLONASE) 50 MCG/ACT nasal spray Place 2 sprays into both nostrils daily as needed for allergies.     [provider]  furosemide (LASIX) 40 MG tablet Take 1 tablet (40 mg total) by mouth 2 (two) times daily. 05/21/17   Catarina Hartshorn, MD  gabapentin (NEURONTIN) 100 MG capsule 100 mg. Take one tablet by mouth in the morning and afternoon and take two tablets at bedtime    [provider]  Ipratropium-Albuterol (COMBIVENT RESPIMAT) 20-100 MCG/ACT AERS respimat Inhale 1-2 puffs into the lungs every 4 (four) hours as needed for wheezing or shortness of breath. 05/10/17   [provider]  ipratropium-albuterol (DUONEB) 0.5-2.5 (3) MG/3ML SOLN Take 3 mLs by nebulization every 6 (six) hours as needed.    [provider]  levothyroxine (SYNTHROID, LEVOTHROID) 75 MCG tablet 75 mcg. Take one tablet daily by mouth except Mon. And Thurs. Take 1 1/2 tablets    [provider]  lisinopril (PRINIVIL,ZESTRIL) 20 MG tablet Take 20 mg by mouth daily.    [provider]    LORazepam (ATIVAN) 0.5 MG tablet Take 0.5 mg by mouth 2 (two) times daily as needed for anxiety or sleep.     [provider]  magic mouthwash SOLN Take 5 mLs by mouth daily as needed for mouth pain (thrush).    [provider]  Melatonin-Pyridoxine (MELATONIN TR WITH VITAMIN B6) 3-10 MG TBCR Take 1 capsule by mouth at bedtime as needed (sleep).     [provider]  metoprolol succinate (TOPROL-XL) 100 MG 24 hr tablet Take 50 mg by mouth daily. Takes 50 mg tablet daily. If blood pressure is elevated take additional 50 mg tablet.    [provider]  Multiple Vitamins-Minerals (ICAPS AREDS FORMULA PO) Take 1 capsule by mouth 2 (two) times daily.     [provider]  nitroGLYCERIN (NITROSTAT) 0.4 MG SL tablet Place 0.4 mg under the tongue every 5 (five) minutes x 3 doses as needed for  chest pain.    [provider]  nystatin-triamcinolone ointment (MYCOLOG) Apply 1 application topically 2 (two) times daily as needed (rash).     [provider]  ondansetron (ZOFRAN) 4 MG tablet Take 4 mg by mouth every 8 (eight) hours as needed for nausea or vomiting.    [provider]  polyethylene glycol (MIRALAX / GLYCOLAX) packet Take 17 g by mouth daily.    [provider]  promethazine (PHENERGAN) 12.5 MG tablet Take 12.5 mg by mouth every 6 (six) hours as needed for nausea or vomiting.    [provider]  ranitidine (ZANTAC) 150 MG tablet Take 150 mg by mouth 2 (two) times daily.    [provider]  traMADol (ULTRAM) 50 MG tablet Take 50 mg by mouth every 6 (six) hours as needed for moderate pain.     [provider]    Family History Family History  Problem Relation Age of Onset  . Hypertension Mother   . CAD Mother   . Hypertension Father   . CAD Father   . Hypertension Sister   . CAD Sister   . Hypertension Brother   . CAD Brother   . Diabetes Brother   . Hypertension Brother   . Diabetes  Brother   . COPD Son     Social History Social History   Tobacco Use  . Smoking status: Former Smoker    Packs/day: 3.00    Types: Cigarettes    Last attempt to quit: 10/23/1976    Years since quitting: 41.4  . Smokeless tobacco: Never Used  Substance Use Topics  . Alcohol use: No  . Drug use: No     Allergies   Bee venom; Codeine; Morphine and related; Norvasc [amlodipine besylate]; Sulfa antibiotics; Valium [diazepam]; Zetia [ezetimibe]; and Penicillins   Review of Systems Review of Systems  Constitutional: Negative for appetite change and fatigue.  HENT: Positive for nosebleeds. Negative for congestion, ear discharge and sinus pressure.   Eyes: Negative for discharge.  Respiratory: Negative for cough.   Cardiovascular: Negative for chest pain.  Gastrointestinal: Negative for abdominal pain and diarrhea.  Genitourinary: Negative for frequency and hematuria.  Musculoskeletal: Negative for back pain.  Skin: Negative for rash.  Neurological: Negative for seizures and headaches.  Psychiatric/Behavioral: Negative for hallucinations.     Physical Exam Updated Vital Signs BP (!) 147/56 (BP Location: Right Arm)   Pulse (!) 59   Temp 98.1 F (36.7 C) (Oral)   Resp 20   Ht 5' 6.5" (1.689 m)   Wt 91.6 kg   SpO2 92%   BMI 32.12 kg/m   Physical Exam Vitals signs and nursing note reviewed.  Constitutional:      Appearance: She is well-developed.  HENT:     Head: Normocephalic.     Nose:     Comments: Dried blood left nostril.  No bleeding anymore Eyes:     General: No scleral icterus.    Conjunctiva/sclera: Conjunctivae normal.  Neck:     Musculoskeletal: Neck supple.     Thyroid: No thyromegaly.  Cardiovascular:     Rate and Rhythm: Normal rate and regular rhythm.     Heart sounds: No murmur. No friction rub. No gallop.   Pulmonary:     Breath sounds: No stridor. No wheezing or rales.  Chest:     Chest wall: No tenderness.  Abdominal:     General: There  is no distension.     Tenderness: There is no  abdominal tenderness. There is no rebound.  Musculoskeletal: Normal range of motion.  Lymphadenopathy:     Cervical: No cervical adenopathy.  Skin:    Findings: No erythema or rash.  Neurological:     Mental Status: She is oriented to person, place, and time.     Motor: No abnormal muscle tone.     Coordination: Coordination normal.  Psychiatric:        Behavior: Behavior normal.      ED Treatments / Results  Labs (all labs ordered are listed, but only abnormal results are displayed) Labs Reviewed - No data to display  EKG None  Radiology No results found.  Procedures Procedures (including critical care time)  Medications Ordered in ED Medications - No data to display   Initial Impression / Assessment and Plan / ED Course  I have reviewed the triage vital signs and the nursing notes.  Pertinent labs & imaging results that were available during my care of the patient were reviewed by me and considered in my medical decision making (see chart for details).     Patient with epistaxis.  This is resolved.  She will not take her blood thinner tomorrow morning and follow-up with her PCP.  Patient is normally on 3 L nasal and her O2 sat was 94% on 3 L at discharge  Final Clinical Impressions(s) / ED Diagnoses   Final diagnoses:  Epistaxis    ED Discharge Orders    None       Bethann Berkshire, MD 03/31/18 2150

## 2018-03-31 NOTE — ED Notes (Signed)
Upon calling pt for triage pt O2 sats 72%, when asked EMS states pt was taken off of O2 upon arrival due to no O2 tank being on wheelchair, upon EMS arrival triage was not notified pt needs O2 or uses O2 at home, when called for triage pt placed on [place]

## 2018-03-31 NOTE — ED Triage Notes (Signed)
Pt had a nose bleed for about 35 mins, has stopped during triage, per EMS v/s stable, EMS reports pt coughed up a large clot from her nose bleed, EMS reports pt is on 3L O2 at home and it was not humidified upon their arrival, pt reports her caregiver normally adds water to her O2

## 2018-05-14 ENCOUNTER — Telehealth: Payer: Self-pay | Admitting: *Deleted

## 2018-05-14 NOTE — Telephone Encounter (Signed)
Per pt - would like an appt with Dr Antoine Poche in [place].  according to documentation, pt is due to f/u in September.  Will call to elevate and schedule as appropriate.

## 2018-05-15 NOTE — Telephone Encounter (Signed)
Called and spoke with patient.  She denies having any problems or concerns at this time.  Advised pt at last office visit Dr. Antoine Poche stated he would see her back in 1 year.  She is due for follow up in September 2020.   Pt states understanding and will call prior to then if any questions or concerns.  She reports her "other cardiologist" that she saw before Dr Antoine Poche used to see her every 6 months or more if necessary.

## 2018-08-26 ENCOUNTER — Telehealth: Payer: Self-pay | Admitting: Cardiology

## 2018-08-26 NOTE — Telephone Encounter (Signed)
New Message    Pt c/o of Chest Pain: STAT if CP now or developed within 24 hours  1. Are you having CP right now?  NO  2. Are you experiencing any other symptoms (ex. SOB, nausea, vomiting, sweating)? No   3. How long have you been experiencing CP? For a couple days  4. Is your CP continuous or coming and going? Coming and going  5. Have you taken Nitroglycerin? She took 2 ?

## 2018-08-26 NOTE — Telephone Encounter (Signed)
Spoke with patient and she has been having chest pains off and on since her surgery in 2014. Did have pain last night, better today. The episodes only usually last a few minutes before subsiding. Last nights pain lasted longer than usual. She does use about a bottle of NTG every 6 months. Patient concerned and wanted sooner appointment Patient advised to go to ED if pain returns and does not subside.  Unable to come to [place] secondary transportation. Will forward to [facility] RN to see if patient can get sooner appointment in [place].

## 2018-09-01 ENCOUNTER — Telehealth: Payer: Self-pay | Admitting: *Deleted

## 2018-09-01 NOTE — Progress Notes (Signed)
Cardiology Office Note   Date:  09/03/2018   ID:  Julie Burns, DOB 01/17/1931, MRN [id number]  PCP:  Dione Housekeeper, MD  Cardiologist:   Minus Breeding, MD    Chief Complaint  Patient presents with  . Chest Pain      History of Present Illness: Julie Burns is a 85 y.o. female who presents for follow-up of CHF and atrial fibrillation.  She had AVR in 2010 (50mm Magna valve)and PAF on anticoagulation and amiodarone.  Of note she had a cardioversion in 2017 with post cardioversion pause requiring atropine.  She also has CAD with DES to the RCA staged PCI to the LAD and circumflex in 2015.  She was thought not to be a CABG candidate.  Since I last saw her she has had episodes of chest discomfort.  This is been sporadic.  It is been mild.  It does not radiate.  She taken nitroglycerin goes away.  She thinks it similar to previous angina.  She has not had any discomfort into her jaw or arms.  She is not been having any new shortness of breath, PND or orthopnea.  She is limited her activity her oxygen most of the time.  Her symptoms come on at rest.  She is had no new weight gain or edema.   Past Medical History:  Diagnosis Date  . Age-related osteoporosis without current pathological fracture   . Anxiety disorder   . Atrial fibrillation ([place])   . CAD (coronary artery disease)    12/2013-DES to RCA due to NSTEMI- staged PCI to LAD and CX (not ideal for CABG; Echocardiogram 01/16/10 EF 55%, severe LVH; Moderate AS; mild ot moderate TR  . Chronic kidney disease   . Chronic respiratory failure with hypoxia ([place])   . COPD (chronic obstructive pulmonary disease) ([place])   . Degenerative myopia with macular hole   . Diabetes ([place])    TYPE 2  . Hyperlipidemia   . Hypothyroidism   . Irritable bowel syndrome without diarrhea   . NSTEMI (non-ST elevated myocardial infarction) ([place])    NSTEMI 3/05; s/p PTCA with stenting of left circumflex coronary artery 3/05; totally occluded small vessel  OM  . Occlusion and stenosis of right carotid artery    Carotid Duplex 7/13 moderate disease (<70%) of the right internal carotid arteries, this is a high bifurcation, s/p left CEA without significant stenosis.  . Polyp of colon   . Presence of prosthetic heart valve    S/P aortic valve replacement 04/29/2008   . PVD (peripheral vascular disease) ([place])   . Sicca syndrome ([place])   . Stroke (cerebrum) ([place])   . Unspecified abdominal hernia without obstruction or gangrene   . Vitamin D deficiency     Past Surgical History:  Procedure Laterality Date  . AORTIC VALVE REPLACEMENT     S/P aortic valve replacement 04/29/2008   . CARDIAC CATHETERIZATION     by Janice Norrie at [facility]; mLAD 30%, patent LCx stent, mRCA 50%, LVEDP 10, severe AS with AV mean gradient 46 mmHg, Right heart catheterization   . CAROTID ENDARTERECTOMY     Carotid Duplex 7/13 moderate disease (<70%) of the right internal carotid arteries, this is a high bifurcation, s/p left CEA without significant stenosis.     Current Outpatient Medications  Medication Sig Dispense Refill  . alendronate (FOSAMAX) 70 MG tablet Take 70 mg by mouth once a week. Take with a full glass of water on an empty stomach.  Take on Sunday's.    . amiodarone (PACERONE) 200 MG tablet Take 200 mg by mouth daily.    [name]. apixaban (ELIQUIS) 5 MG TABS tablet Take 5 mg by mouth 2 (two) times daily.    . budesonide (PULMICORT) 0.5 MG/2ML nebulizer solution Take 0.5 mg by nebulization 2 (two) times daily.    . Cholecalciferol (VITAMIN D3) 5000 units CAPS Take 1 capsule by mouth daily.    . furosemide (LASIX) 40 MG tablet Take 1 tablet (40 mg total) by mouth 2 (two) times daily. 30 tablet 1  . Ipratropium-Albuterol (COMBIVENT RESPIMAT) 20-100 MCG/ACT AERS respimat Inhale 1-2 puffs into the lungs every 4 (four) hours as needed for wheezing or shortness of breath.    [name]. ipratropium-albuterol (DUONEB) 0.5-2.5 (3) MG/3ML SOLN Take 3 mLs by nebulization every 6 (six)  hours as needed.    [name]. levothyroxine (SYNTHROID, LEVOTHROID) 75 MCG tablet 75 mcg. Take one tablet daily by mouth except Mon. And Thurs. Take 1 1/2 tablets    . lisinopril (PRINIVIL,ZESTRIL) 20 MG tablet Take 20 mg by mouth daily.    [name]. LORazepam (ATIVAN) 0.5 MG tablet Take 0.5 mg by mouth 2 (two) times daily as needed for anxiety or sleep.     . metoprolol succinate (TOPROL-XL) 100 MG 24 hr tablet Take 50 mg by mouth daily. Takes 50 mg tablet daily. If blood pressure is elevated take additional 50 mg tablet.    . Multiple Vitamins-Minerals (ICAPS AREDS FORMULA PO) Take 1 capsule by mouth 2 (two) times daily.     . nitroGLYCERIN (NITROSTAT) 0.4 MG SL tablet Place 0.4 mg under the tongue every 5 (five) minutes x 3 doses as needed for chest pain.    [name]. ondansetron (ZOFRAN) 4 MG tablet Take 4 mg by mouth every 8 (eight) hours as needed for nausea or vomiting.    . polyethylene glycol (MIRALAX / GLYCOLAX) packet Take 17 g by mouth daily.    . traMADol (ULTRAM) 50 MG tablet Take 50 mg by mouth every 6 (six) hours as needed for moderate pain.     . benzonatate (TESSALON) 200 MG capsule Take 200 mg by mouth 3 (three) times daily as needed for cough.    . isosorbide mononitrate (IMDUR) 60 MG 24 hr tablet Take 1 tablet (60 mg total) by mouth daily. 90 tablet 3  . magic mouthwash SOLN Take 5 mLs by mouth daily as needed for mouth pain (thrush).    . Melatonin-Pyridoxine (MELATONIN TR WITH VITAMIN B6) 3-10 MG TBCR Take 1 capsule by mouth at bedtime as needed (sleep).     . nystatin-triamcinolone ointment (MYCOLOG) Apply 1 application topically 2 (two) times daily as needed (rash).      No current facility-administered medications for this visit.     Allergies:   Bee venom, Codeine, Morphine and related, Norvasc [amlodipine besylate], Sulfa antibiotics, Valium [diazepam], Zetia [ezetimibe], and Penicillins 1   ROS:  Please see the history of present illness.   Otherwise, review of systems are positive for  none.   All other systems are reviewed and negative.    PHYSICAL EXAM: VS:  BP (!) 170/60   Pulse (!) 54   Ht 5' 6.5" (1.689 m)   Wt 197 lb (89.4 kg)   BMI 31.32 kg/m  , BMI Body mass index is 31.32 kg/m.  GENERAL:  Well appearing NECK:  No jugular venous distention, waveform within normal limits, carotid upstroke brisk and symmetric, no bruits, no thyromegaly LUNGS:  Clear to  auscultation bilaterally CHEST: Well-healed sternotomy scar, left mastectomy HEART:  PMI not displaced or sustained,S1 and S2 within normal limits, no S3, no S4, no clicks, no rubs, 2 out of 6 apical systolic early peaking murmur, no diastolic murmurs ABD:  Flat, positive bowel sounds normal in frequency in pitch, no bruits, no rebound, no guarding, no midline pulsatile mass, no hepatomegaly, no splenomegaly EXT:  2 plus pulses throughout, no edema, no cyanosis no clubbing   EKG:  EKG is  ordered today. The ekg ordered today demonstrates sinus rhythm, rate 54, axis within normal limits, intervals within normal limits, no acute ST-T wave changes.   Recent Labs: No results found for requested labs within last 8760 hours.    Lipid Panel No results found for: CHOL, TRIG, HDL, CHOLHDL, VLDL, LDLCALC, LDLDIRECT    Wt Readings from Last 3 Encounters:  09/03/18 197 lb (89.4 kg)  03/31/18 202 lb (91.6 kg)  11/13/17 208 lb (94.3 kg)      Lab Results  Component Value Date   ALT 9 (L) 05/17/2017   AST 12 (L) 05/17/2017   ALKPHOS 80 05/17/2017   BILITOT 1.1 05/17/2017   PROT 6.4 (L) 05/17/2017   ALBUMIN 3.2 (L) 05/17/2017     Other studies Reviewed: Additional studies/ records that were reviewed today include:    ASSESSMENT AND PLAN:    ATRIAL FIB:    He is tolerating this.  I was able to see some blood work from her primary care office and she did have a recent liver enzymes that were normal.  These are recorded under Care Everywhere.  She had normal TSH last year.  No change in therapy.  She does  have some nosebleeds but we talked at length about this and these are relatively infrequent so not going to stop her anticoagulation.  Let me know if she has worsening nosebleeds that she might need to see ENT.   CAD:   She does have some mild chest discomfort.  And then he had Imdur 60 mg daily.  She will let me know if she has worsening symptoms.  AVR: She had a stable valve replacement last year.  No change in therapy.  No further imaging.  CEA:  Status post left CEA.  She had moderate right-sided stenosis and she has not been evaluated since December 2018 so she will get a Doppler in December of this year.  DYSLIPIDEMIA: Her LDL was 151 with an HDL of 49.  She does not want therapy for this however.  HTN: Her blood pressure fluctuates somewhat.  Getting up to get her as above but otherwise treat conservatively.  Current medicines are reviewed at length with the patient today.  The patient does not have concerns regarding medicines.  The following changes have been made:  As above  Labs/ tests ordered today include:  Orders Placed This Encounter  Procedures  . EKG 12-Lead     Disposition:   FU with me in 6 months in the office .     Signed, Rollene RotundaJames Dasiah Hooley, MD  09/03/2018 10:49 AM    [facility]

## 2018-09-01 NOTE — Telephone Encounter (Signed)
Spoke with patient who is very concerned about herself.  She reports having more frequent episodes of chest pain and also having nose bleeds about once a week.  No s/s at this time.  appt rescheduled to the [facility] office of Wednesay 7/1 at 10 am.  Pt is aware to bring medications and her insurance cards.  She is having a lot more trouble with her eye sight and may need assistance getting into the building.  She does have a caregiver who will be with her.

## 2018-09-01 NOTE — Telephone Encounter (Signed)
    COVID-19 Pre-Screening Questions:  . In the past 7 to 10 days have you had a cough,  shortness of breath, headache, congestion, fever (100 or greater) body aches, chills, sore throat, or sudden loss of taste or sense of smell? . Have you been around anyone with known Covid 19. . Have you been around anyone who is awaiting Covid 19 test results in the past 7 to 10 days? . Have you been around anyone who has been exposed to Covid 19, or has mentioned symptoms of Covid 19 within the past 7 to 10 days?  If you have any concerns/questions about symptoms patients report during screening (either on the phone or at threshold). Contact the provider seeing the patient or DOD for further guidance.  If neither are available contact a member of the leadership team.       Patients answered no to all above question. She knows to wear a mask, arrive only 15 minutes early. It is necessary for her to have her aid with her and she will wear a mask also. CP/cma

## 2018-09-03 ENCOUNTER — Encounter: Payer: Self-pay | Admitting: Cardiology

## 2018-09-03 ENCOUNTER — Ambulatory Visit (INDEPENDENT_AMBULATORY_CARE_PROVIDER_SITE_OTHER): Payer: Medicare Other | Admitting: Cardiology

## 2018-09-03 ENCOUNTER — Other Ambulatory Visit: Payer: Self-pay

## 2018-09-03 VITALS — BP 170/60 | HR 54 | Ht 66.5 in | Wt 197.0 lb

## 2018-09-03 DIAGNOSIS — I10 Essential (primary) hypertension: Secondary | ICD-10-CM | POA: Diagnosis not present

## 2018-09-03 DIAGNOSIS — I6523 Occlusion and stenosis of bilateral carotid arteries: Secondary | ICD-10-CM

## 2018-09-03 DIAGNOSIS — I480 Paroxysmal atrial fibrillation: Secondary | ICD-10-CM | POA: Diagnosis not present

## 2018-09-03 DIAGNOSIS — I25118 Atherosclerotic heart disease of native coronary artery with other forms of angina pectoris: Secondary | ICD-10-CM | POA: Diagnosis not present

## 2018-09-03 DIAGNOSIS — E785 Hyperlipidemia, unspecified: Secondary | ICD-10-CM

## 2018-09-03 MED ORDER — ISOSORBIDE MONONITRATE ER 60 MG PO TB24: 60.0000 mg | ORAL_TABLET | Freq: Every day | ORAL | 3 refills | Status: DC

## 2018-09-03 NOTE — Patient Instructions (Addendum)
Medication Instructions:  Please start Isosorbide 60 mg daily. Continue all other medications as listed.  If you need a refill on your cardiac medications before your next appointment, please call your pharmacy.   Testing/Procedures: Your physician has requested that you have a carotid duplex. This test is an ultrasound of the carotid arteries in your neck. It looks at blood flow through these arteries that supply the brain with blood. Allow one hour for this exam. There are no restrictions or special instructions.  You will contacted at a later date to schedule this test.  Follow-Up: Follow up in 6 months with Dr. Percival Spanish.  You will receive a letter in the mail 2 months before you are due.  Please call us when you receive this letter to schedule your follow up appointment.  Thank you for choosing [facility]!!

## 2018-09-10 ENCOUNTER — Other Ambulatory Visit: Payer: Self-pay

## 2018-09-11 ENCOUNTER — Encounter: Payer: Self-pay | Admitting: Family Medicine

## 2018-09-11 ENCOUNTER — Ambulatory Visit (INDEPENDENT_AMBULATORY_CARE_PROVIDER_SITE_OTHER): Payer: Medicare Other | Admitting: Family Medicine

## 2018-09-11 VITALS — BP 155/65 | HR 56 | Temp 98.8°F | Wt 200.0 lb

## 2018-09-11 DIAGNOSIS — I5030 Unspecified diastolic (congestive) heart failure: Secondary | ICD-10-CM

## 2018-09-11 DIAGNOSIS — I6523 Occlusion and stenosis of bilateral carotid arteries: Secondary | ICD-10-CM

## 2018-09-11 DIAGNOSIS — I10 Essential (primary) hypertension: Secondary | ICD-10-CM

## 2018-09-11 DIAGNOSIS — I4891 Unspecified atrial fibrillation: Secondary | ICD-10-CM

## 2018-09-11 DIAGNOSIS — R0602 Shortness of breath: Secondary | ICD-10-CM

## 2018-09-11 DIAGNOSIS — J438 Other emphysema: Secondary | ICD-10-CM

## 2018-09-11 NOTE — Progress Notes (Signed)
Subjective:  Patient ID: Julie Burns, female    DOB: [birth year]  Age: 85 y.o. MRN: [id number]  CC: New Patient (Initial Visit) and Establish Care   HPI Julie Burns presents for new patient evaluation due to COPD, end stage with O2 dependence. She uses 2 liters N-C, but increases to 3.5L at home as needed. She receives CAP services and is due for recertification. She needs an M.D. evaluation for this. Currently she is at er baseline for dyspnea. She is also at her baseline for weakness, which prevents her from walking over a few feet at a time.   Patient in for follow-up of atrial fibrillation. Patient denies any recent bouts of chest pain or palpitations. Additionally, patient is taking anticoagulant, eliquis. Patient denies any recent excessive bleeding episodes including epistaxis, bleeding from the gums, genitalia, rectal bleeding or hematuria. Additionally there has been no excessive bruising.  She has a history of coronary bypass grafting and congestive heart failure.  She denies current edema.    Patient also is having chronic abdominal pain. Her GI has recommended a small bowel camera. Pt. Is still deciding.  Depression screen PHQ 2/9 09/11/2018  Decreased Interest 0  Down, Depressed, Hopeless 0  PHQ - 2 Score 0    History Julie DandyMary has a past medical history of Age-related osteoporosis without current pathological fracture, Anxiety disorder, Atrial fibrillation (HCC), CAD (coronary artery disease), Chronic kidney disease, Chronic respiratory failure with hypoxia (HCC), COPD (chronic obstructive pulmonary disease) (HCC), Degenerative myopia with macular hole, Diabetes (HCC), Hyperlipidemia, Hypothyroidism, Irritable bowel syndrome without diarrhea, NSTEMI (non-ST elevated myocardial infarction) (HCC), Occlusion and stenosis of right carotid artery, Polyp of colon, Presence of prosthetic heart valve, PVD (peripheral vascular disease) (HCC), Sicca syndrome (HCC), Stroke (cerebrum) (HCC),  Unspecified abdominal hernia without obstruction or gangrene, and Vitamin D deficiency.   She has a past surgical history that includes Cardiac catheterization; Carotid endarterectomy; and Aortic valve replacement.   Her family history includes CAD in her brother, father, mother, and sister; COPD in her son; Diabetes in her brother and brother; Hypertension in her brother, brother, father, mother, and sister.She reports that she quit smoking about 41 years ago. Her smoking use included cigarettes. She smoked 3.00 packs per day. She has never used smokeless tobacco. She reports that she does not drink alcohol or use drugs.    ROS Review of Systems  Constitutional: Negative.   HENT: Negative for congestion.   Eyes: Negative for visual disturbance.  Respiratory: Negative for shortness of breath.   Cardiovascular: Negative for chest pain.  Gastrointestinal: Negative for abdominal pain, constipation, diarrhea, nausea and vomiting.  Genitourinary: Negative for difficulty urinating.  Musculoskeletal: Negative for arthralgias and myalgias.  Neurological: Negative for headaches.  Psychiatric/Behavioral: Negative for sleep disturbance.    Objective:  BP (!) 155/65    Pulse (!) 56    Temp 98.8 F (37.1 C) (Oral)    Wt 200 lb (90.7 kg)    SpO2 (!) 87% Comment: 2 liter nasal canula   BMI 31.80 kg/m   BP Readings from Last 3 Encounters:  09/11/18 (!) 155/65  09/03/18 (!) 170/60  03/31/18 135/66    Wt Readings from Last 3 Encounters:  09/11/18 200 lb (90.7 kg)  09/03/18 197 lb (89.4 kg)  03/31/18 202 lb (91.6 kg)     Physical Exam Constitutional:      General: She is not in acute distress.    Appearance: She is well-developed.  HENT:  Head: Normocephalic and atraumatic.     Right Ear: External ear normal.     Left Ear: External ear normal.     Nose: Nose normal.  Eyes:     Conjunctiva/sclera: Conjunctivae normal.     Pupils: Pupils are equal, round, and reactive to light.    Neck:     Musculoskeletal: Normal range of motion and neck supple.     Thyroid: No thyromegaly.  Cardiovascular:     Rate and Rhythm: Normal rate and regular rhythm.     Heart sounds: Normal heart sounds. No murmur.  Pulmonary:     Effort: Pulmonary effort is normal. No respiratory distress.     Breath sounds: Wheezing and rhonchi present. No rales.     Comments: Fair to poor exchange with scattered rhonchi and wheezing Abdominal:     General: Bowel sounds are normal. There is no distension.     Palpations: Abdomen is soft.     Tenderness: There is abdominal tenderness (periumbilical).  Lymphadenopathy:     Cervical: No cervical adenopathy.  Skin:    General: Skin is warm and dry.  Neurological:     Mental Status: She is alert and oriented to person, place, and time.     Deep Tendon Reflexes: Reflexes are normal and symmetric.  Psychiatric:        Behavior: Behavior normal.        Thought Content: Thought content normal.        Judgment: Judgment normal.       Assessment & Plan:   Julie Burns was seen today for new patient (initial visit) and establish care.  Diagnoses and all orders for this visit:  Other emphysema ([place])  Diastolic congestive heart failure, unspecified HF chronicity (HCC)  SOB (shortness of breath)  Essential hypertension  Atrial fibrillation, unspecified type ([place])       I have discontinued Chauncey Cruel. [place]'s benzonatate and magic mouthwash. I am also having her maintain her amiodarone, ipratropium-albuterol, Melatonin-Pyridoxine, budesonide, nystatin-triamcinolone ointment, apixaban, levothyroxine, metoprolol succinate, polyethylene glycol, LORazepam, nitroGLYCERIN, alendronate, traMADol, Ipratropium-Albuterol, Multiple Vitamins-Minerals (ICAPS AREDS FORMULA PO), Vitamin D3, ondansetron, furosemide, lisinopril, isosorbide mononitrate, Misc. Devices, and OXYGEN.  Allergies as of 09/11/2018      Reactions   Bee Venom Swelling   Codeine Hives    Morphine And Related Other (See Comments)   Patient states she "felt weird".   Norvasc [amlodipine Besylate] Swelling   Sulfa Antibiotics    Valium [diazepam]    Zetia [ezetimibe]    Penicillins Hives, Rash   Has patient had a PCN reaction causing immediate rash, facial/tongue/throat swelling, SOB or lightheadedness with hypotension: yes Has patient had a PCN reaction causing severe rash involving mucus membranes or skin necrosis: Unknown Has patient had a PCN reaction that required hospitalization: No Has patient had a PCN reaction occurring within the last 10 years: No If all of the above answers are "NO", then may proceed with Cephalosporin use.      Medication List       Accurate as of September 11, 2018 11:59 PM. If you have any questions, ask your nurse or doctor.        STOP taking these medications   benzonatate 200 MG capsule Commonly known as: TESSALON Stopped by: Claretta Fraise, MD   magic mouthwash Soln Stopped by: Claretta Fraise, MD     TAKE these medications   alendronate 70 MG tablet Commonly known as: FOSAMAX Take 70 mg by mouth once a week. Take with a  full glass of water on an empty stomach. Take on Sunday's.   amiodarone 200 MG tablet Commonly known as: PACERONE Take 200 mg by mouth daily.   budesonide 0.5 MG/2ML nebulizer solution Commonly known as: PULMICORT Take 0.5 mg by nebulization 2 (two) times daily.   Eliquis 5 MG Tabs tablet Generic drug: apixaban Take 5 mg by mouth 2 (two) times daily.   furosemide 40 MG tablet Commonly known as: LASIX Take 1 tablet (40 mg total) by mouth 2 (two) times daily.   ICAPS AREDS FORMULA PO Take 1 capsule by mouth 2 (two) times daily.   Combivent Respimat 20-100 MCG/ACT Aers respimat Generic drug: Ipratropium-Albuterol Inhale 1-2 puffs into the lungs every 4 (four) hours as needed for wheezing or shortness of breath.   ipratropium-albuterol 0.5-2.5 (3) MG/3ML Soln Commonly known as: DUONEB Take 3 mLs by  nebulization every 6 (six) hours as needed.   isosorbide mononitrate 60 MG 24 hr tablet Commonly known as: IMDUR Take 1 tablet (60 mg total) by mouth daily.   levothyroxine 75 MCG tablet Commonly known as: SYNTHROID 75 mcg. Take one tablet daily by mouth except Mon. And Thurs. Take 1 1/2 tablets   lisinopril 20 MG tablet Commonly known as: ZESTRIL Take 20 mg by mouth daily.   LORazepam 0.5 MG tablet Commonly known as: ATIVAN Take 0.5 mg by mouth 2 (two) times daily as needed for anxiety or sleep.   Melatonin TR with Vitamin B6 3-10 MG Tbcr Generic drug: Melatonin-Pyridoxine Take 1 capsule by mouth at bedtime as needed (sleep).   metoprolol succinate 100 MG 24 hr tablet Commonly known as: TOPROL-XL Take 50 mg by mouth daily. Takes 50 mg tablet daily. If blood pressure is elevated take additional 50 mg tablet.   Misc. Devices Misc underpads due to urinary incontinence   nitroGLYCERIN 0.4 MG SL tablet Commonly known as: NITROSTAT Place 0.4 mg under the tongue every 5 (five) minutes x 3 doses as needed for chest pain.   nystatin-triamcinolone ointment Commonly known as: MYCOLOG Apply 1 application topically 2 (two) times daily as needed (rash).   ondansetron 4 MG tablet Commonly known as: ZOFRAN Take 4 mg by mouth every 8 (eight) hours as needed for nausea or vomiting.   OXYGEN Inhale into the lungs.   polyethylene glycol 17 g packet Commonly known as: MIRALAX / GLYCOLAX Take 17 g by mouth daily.   traMADol 50 MG tablet Commonly known as: ULTRAM Take 50 mg by mouth every 6 (six) hours as needed for moderate pain.   Vitamin D3 125 MCG (5000 UT) Caps Take 1 capsule by mouth daily.        Follow-up: Return in about 3 months (around 12/12/2018) for COPD, CHF.  Mechele ClaudeWarren Keo Schirmer, M.D.

## 2018-09-19 ENCOUNTER — Encounter: Payer: Self-pay | Admitting: Family Medicine

## 2018-09-23 ENCOUNTER — Other Ambulatory Visit: Payer: Self-pay

## 2018-09-23 MED ORDER — NITROGLYCERIN 0.4 MG SL SUBL: 0.4000 mg | SUBLINGUAL_TABLET | SUBLINGUAL | 1 refills | Status: DC | PRN

## 2018-11-19 ENCOUNTER — Ambulatory Visit: Payer: Medicare Other | Admitting: Cardiology

## 2018-11-19 ENCOUNTER — Other Ambulatory Visit: Payer: Self-pay | Admitting: Cardiology

## 2018-11-24 ENCOUNTER — Telehealth: Payer: Self-pay | Admitting: Family Medicine

## 2018-11-24 NOTE — Telephone Encounter (Signed)
I'll complete when I return to the office if you will leave it on my desk.

## 2018-12-09 ENCOUNTER — Other Ambulatory Visit: Payer: Self-pay

## 2018-12-09 ENCOUNTER — Ambulatory Visit (INDEPENDENT_AMBULATORY_CARE_PROVIDER_SITE_OTHER): Payer: Medicare Other

## 2018-12-09 DIAGNOSIS — I2510 Atherosclerotic heart disease of native coronary artery without angina pectoris: Secondary | ICD-10-CM

## 2018-12-09 DIAGNOSIS — J449 Chronic obstructive pulmonary disease, unspecified: Secondary | ICD-10-CM | POA: Diagnosis not present

## 2018-12-09 DIAGNOSIS — I5031 Acute diastolic (congestive) heart failure: Secondary | ICD-10-CM

## 2018-12-09 DIAGNOSIS — E1122 Type 2 diabetes mellitus with diabetic chronic kidney disease: Secondary | ICD-10-CM

## 2018-12-09 DIAGNOSIS — I130 Hypertensive heart and chronic kidney disease with heart failure and stage 1 through stage 4 chronic kidney disease, or unspecified chronic kidney disease: Secondary | ICD-10-CM

## 2018-12-15 ENCOUNTER — Encounter: Payer: Self-pay | Admitting: Family Medicine

## 2018-12-15 ENCOUNTER — Ambulatory Visit (INDEPENDENT_AMBULATORY_CARE_PROVIDER_SITE_OTHER): Payer: Medicare Other | Admitting: Family Medicine

## 2018-12-15 DIAGNOSIS — J438 Other emphysema: Secondary | ICD-10-CM | POA: Diagnosis not present

## 2018-12-15 DIAGNOSIS — I6523 Occlusion and stenosis of bilateral carotid arteries: Secondary | ICD-10-CM

## 2018-12-15 MED ORDER — BUDESONIDE 0.5 MG/2ML IN SUSP: 0.5000 mg | Freq: Two times a day (BID) | RESPIRATORY_TRACT | 5 refills | Status: AC

## 2018-12-15 NOTE — Progress Notes (Addendum)
Subjective:    Patient ID: Julie Burns, female    DOB: 1930/10/22, 85 y.o.   MRN: [id number]   HPI: Julie Burns is a 85 y.o. female presenting for Julie Burns is a 85 y.o. female presenting for follow up of COPD. Using O2 24/7.  Also having back pain a lot. Nausea after eating. " I just feel bad." Cant get out due to poor vision - feels like a prisoner in her apartment.An aid from home care stays 4-6 hours. Fix breakfast and lunch for her. Pt. Gets aggravated due to limitations. 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 Denies depression.    Depression screen PHQ 2/9 09/11/2018  Decreased Interest 0  Down, Depressed, Hopeless 0  PHQ - 2 Score 0     Relevant past medical, surgical, family and social history reviewed and updated as indicated.  Interim medical history since our last visit reviewed. Allergies and medications reviewed and updated.  ROS:  Review of Systems  Constitutional: Negative.   HENT: Negative for congestion.   Eyes: Negative for visual disturbance.  Respiratory: Positive for shortness of breath.   Cardiovascular: Negative for chest pain.  Gastrointestinal: Negative for abdominal pain, constipation, diarrhea, nausea and vomiting.  Genitourinary: Negative for difficulty urinating.  Musculoskeletal: Negative for arthralgias and myalgias.  Neurological: Negative for headaches.  Psychiatric/Behavioral: Negative for sleep disturbance.     Social History   Tobacco Use  Smoking Status Former Smoker  . Packs/day: 3.00  . Types: Cigarettes  . Quit date: 10/23/1976  . Years since quitting: 42.1  Smokeless Tobacco Never Used       Objective:     Wt Readings from Last 3 Encounters:  09/11/18 200 lb (90.7 kg)  09/03/18 197 lb (89.4 kg)  03/31/18 202 lb (91.6 kg)     Exam deferred. Pt. Harboring due to COVID 19. Phone visit performed.   Assessment & Plan:   1. Other emphysema ([place])     Meds ordered this encounter   Medications  . budesonide (PULMICORT) 0.5 MG/2ML nebulizer solution    Sig: Take 2 mLs (0.5 mg total) by nebulization 2 (two) times daily.    Dispense:  120 mL    Refill:  5    No orders of the defined types were placed in this encounter.     Diagnoses and all orders for this visit:  Other emphysema ([place])  Other orders -     budesonide (PULMICORT) 0.5 MG/2ML nebulizer solution; Take 2 mLs (0.5 mg total) by nebulization 2 (two) times daily.    Virtual Visit via telephone Note  I discussed the limitations, risks, security and privacy concerns of performing an evaluation and management service by telephone and the availability of in person appointments. The patient was identified with two identifiers. Pt.expressed understanding and agreed to proceed. Pt. Is at home. Dr. Livia Snellen is in his office.  Follow Up Instructions:   I discussed the assessment and treatment plan with the patient. The patient was provided an opportunity to ask questions and all were answered. The patient agreed with the plan and demonstrated an understanding of the instructions.   The patient was advised to call back or seek an in-person evaluation if the symptoms worsen or if the condition fails to improve as anticipated.   Total minutes including chart review and phone contact time: 20  Follow up plan: Return in about 3 months (around 03/17/2019), or if symptoms worsen  or fail to improve.  Claretta Fraise, MD [place]

## 2018-12-16 ENCOUNTER — Other Ambulatory Visit: Payer: Self-pay

## 2018-12-16 ENCOUNTER — Ambulatory Visit (INDEPENDENT_AMBULATORY_CARE_PROVIDER_SITE_OTHER): Payer: Medicare Other | Admitting: *Deleted

## 2018-12-16 DIAGNOSIS — Z0000 Encounter for general adult medical examination without abnormal findings: Secondary | ICD-10-CM

## 2018-12-16 NOTE — Progress Notes (Addendum)
MEDICARE ANNUAL WELLNESS VISIT  12/16/2018  Telephone Visit Disclaimer This Medicare AWV was conducted by telephone due to national recommendations for restrictions regarding the COVID-19 Pandemic (e.g. social distancing).  I verified, using two identifiers, that I am speaking with Julie Burns or their authorized healthcare agent. I discussed the limitations, risks, security, and privacy concerns of performing an evaluation and management service by telephone and the potential availability of an in-person appointment in the future. The patient expressed understanding and agreed to proceed.   Subjective:  Julie Burns is a 85 y.o. female patient of Stacks, Broadus John, MD who had a Medicare Annual Wellness Visit today via telephone. Julie Burns is Retired and lives alone. she has 2 living children and 4 deceased. she reports that she is socially active and does interact with friends/family regularly. she is not physically active and enjoys listening to music and her audio books.  Patient Care Team: Mechele Claude, MD as PCP - General (Family Medicine) Rollene Rotunda, MD as PCP - Cardiology (Cardiology) Kari Baars, MD as Consulting Physician (Pulmonary Disease)  Advanced Directives 12/16/2018 03/31/2018 05/15/2017 05/15/2017  Does Patient Have a Medical Advance Directive? No No No No  Would patient like information on creating a medical advance directive? No - Patient declined - No - Patient declined No - Patient declined    Hospital Utilization Over the Past 12 Months: # of hospitalizations or ER visits: 0 # of surgeries: 0  Review of Systems    Patient reports that her overall health is worse due to her vision compared to last year.  History obtained from chart review  Patient Reported Readings (BP, Pulse, CBG, Weight, etc) none  Pain Assessment Pain : No/denies pain     Current Medications & Allergies (verified) Allergies as of 12/16/2018      Reactions   Aleve [naproxen  Sodium] Other (See Comments)   Makes me fell "drunk-like"   Bee Venom Swelling   Codeine Hives   Ibuprofen Other (See Comments)   "drunk-like" feeling   Morphine And Related Other (See Comments)   Patient states she "felt weird".   Norvasc [amlodipine Besylate] Swelling   Sulfa Antibiotics    Tylenol [acetaminophen] Other (See Comments)   "drunk-like feeling"   Valium [diazepam]    Zetia [ezetimibe]    Penicillins Hives, Rash   Has patient had a PCN reaction causing immediate rash, facial/tongue/throat swelling, SOB or lightheadedness with hypotension: yes Has patient had a PCN reaction causing severe rash involving mucus membranes or skin necrosis: Unknown Has patient had a PCN reaction that required hospitalization: No Has patient had a PCN reaction occurring within the last 10 years: No If all of the above answers are "NO", then may proceed with Cephalosporin use.      Medication List       Accurate as of December 16, 2018  2:17 PM. If you have any questions, ask your nurse or doctor.        alendronate 70 MG tablet Commonly known as: FOSAMAX Take 70 mg by mouth once a week. Take with a full glass of water on an empty stomach. Take on Sunday's.   amiodarone 200 MG tablet Commonly known as: PACERONE Take 200 mg by mouth daily.   budesonide 0.5 MG/2ML nebulizer solution Commonly known as: PULMICORT Take 2 mLs (0.5 mg total) by nebulization 2 (two) times daily.   Eliquis 5 MG Tabs tablet Generic drug: apixaban Take 5 mg by mouth 2 (two) times daily.  furosemide 40 MG tablet Commonly known as: LASIX Take 1 tablet (40 mg total) by mouth 2 (two) times daily.   ICAPS AREDS FORMULA PO Take 1 capsule by mouth 2 (two) times daily.   Combivent Respimat 20-100 MCG/ACT Aers respimat Generic drug: Ipratropium-Albuterol Inhale 1-2 puffs into the lungs every 4 (four) hours as needed for wheezing or shortness of breath.   ipratropium-albuterol 0.5-2.5 (3) MG/3ML Soln  Commonly known as: DUONEB Take 3 mLs by nebulization every 6 (six) hours as needed.   isosorbide mononitrate 60 MG 24 hr tablet Commonly known as: IMDUR Take 1 tablet (60 mg total) by mouth daily.   levothyroxine 75 MCG tablet Commonly known as: SYNTHROID 75 mcg. Take one tablet daily by mouth except Mon. And Thurs. Take 1 1/2 tablets   lisinopril 20 MG tablet Commonly known as: ZESTRIL Take 20 mg by mouth daily.   LORazepam 0.5 MG tablet Commonly known as: ATIVAN Take 0.5 mg by mouth 2 (two) times daily as needed for anxiety or sleep.   Melatonin TR with Vitamin B6 3-10 MG Tbcr Generic drug: Melatonin-Pyridoxine Take 1 capsule by mouth at bedtime as needed (sleep).   metoprolol succinate 100 MG 24 hr tablet Commonly known as: TOPROL-XL Take 50 mg by mouth daily. Takes 50 mg tablet daily. If blood pressure is elevated take additional 50 mg tablet.   Misc. Devices Misc underpads due to urinary incontinence   nitroGLYCERIN 0.4 MG SL tablet Commonly known as: NITROSTAT PLACE 1 TABLET UNDER THE TONGUE AT ONSET OF CHEST PAIN EVERY 5 MINTUES UP TO 3 TIMES AS NEEDED   nystatin-triamcinolone ointment Commonly known as: MYCOLOG Apply 1 application topically 2 (two) times daily as needed (rash).   ondansetron 4 MG tablet Commonly known as: ZOFRAN Take 4 mg by mouth every 8 (eight) hours as needed for nausea or vomiting.   OXYGEN Inhale into the lungs.   polyethylene glycol 17 g packet Commonly known as: MIRALAX / GLYCOLAX Take 17 g by mouth daily.   traMADol 50 MG tablet Commonly known as: ULTRAM Take 50 mg by mouth every 6 (six) hours as needed for moderate pain.   Vitamin D3 125 MCG (5000 UT) Caps Take 1 capsule by mouth daily.       History (reviewed): Past Medical History:  Diagnosis Date  . Age-related osteoporosis without current pathological fracture   . Anxiety disorder   . Atrial fibrillation (HCC)   . CAD (coronary artery disease)    12/2013-DES to  RCA due to NSTEMI- staged PCI to LAD and CX (not ideal for CABG; Echocardiogram 01/16/10 EF 55%, severe LVH; Moderate AS; mild ot moderate TR  . Chronic kidney disease   . Chronic respiratory failure with hypoxia (HCC)   . COPD (chronic obstructive pulmonary disease) (HCC)   . Degenerative myopia with macular hole   . Diabetes (HCC)    TYPE 2  . Hyperlipidemia   . Hypothyroidism   . Irritable bowel syndrome without diarrhea   . NSTEMI (non-ST elevated myocardial infarction) (HCC)    NSTEMI 3/05; s/p PTCA with stenting of left circumflex coronary artery 3/05; totally occluded small vessel OM  . Occlusion and stenosis of right carotid artery    Carotid Duplex 7/13 moderate disease (<70%) of the right internal carotid arteries, this is a high bifurcation, s/p left CEA without significant stenosis.  . Polyp of colon   . Presence of prosthetic heart valve    S/P aortic valve replacement 04/29/2008   . PVD (  peripheral vascular disease) (HCC)   . Sicca syndrome (HCC)   . Stroke (cerebrum) (HCC)   . Unspecified abdominal hernia without obstruction or gangrene   . Vitamin D deficiency    Past Surgical History:  Procedure Laterality Date  . AORTIC VALVE REPLACEMENT     S/P aortic valve replacement 04/29/2008   . CARDIAC CATHETERIZATION     by Flonnie Hailstone at [facility]; mLAD 30%, patent LCx stent, mRCA 50%, LVEDP 10, severe AS with AV mean gradient 46 mmHg, Right heart catheterization   . CAROTID ENDARTERECTOMY     Carotid Duplex 7/13 moderate disease (<70%) of the right internal carotid arteries, this is a high bifurcation, s/p left CEA without significant stenosis.   Family History  Problem Relation Age of Onset  . Hypertension Mother   . CAD Mother   . Hypertension Father   . CAD Father   . Hypertension Sister   . CAD Sister   . Hypertension Brother   . CAD Brother   . Diabetes Brother   . Hypertension Brother   . Diabetes Brother   . COPD Son    Social History   Socioeconomic  History  . Marital status: Divorced    Spouse name: Not on file  . Number of children: 6  . Years of education: Not on file  . Highest education level: GED or equivalent  Occupational History  . Occupation: retired  Engineer, production  . Financial resource strain: Not hard at all  . Food insecurity    Worry: Never true    Inability: Never true  . Transportation needs    Medical: No    Non-medical: No  Tobacco Use  . Smoking status: Former Smoker    Packs/day: 3.00    Types: Cigarettes    Quit date: 10/23/1976    Years since quitting: 42.1  . Smokeless tobacco: Never Used  Substance and Sexual Activity  . Alcohol use: No  . Drug use: No  . Sexual activity: Not Currently  Lifestyle  . Physical activity    Days per week: 0 days    Minutes per session: 0 min  . Stress: To some extent  Relationships  . Social connections    Talks on phone: More than three times a week    Gets together: Three times a week    Attends religious service: Never    Active member of club or organization: No    Attends meetings of clubs or organizations: Never    Relationship status: Divorced  Other Topics Concern  . Not on file  Social History Narrative   Pt lives at home alone but has a caregiver come in 7 days a week and talks with her daughter daily.    Activities of Daily Living In your present state of health, do you have any difficulty performing the following activities: 12/16/2018  Hearing? Y  Comment has trouble now with hearing but declined referral for the audiologist at this time  Vision? Y  Comment has macular degenerative myopia with macular hole-does see Dr Luciana Axe for this  Difficulty concentrating or making decisions? N  Walking or climbing stairs? Y  Comment due to vision she has to have someone with her at all times to use stairs or if walking outside, uses walker in her house  Dressing or bathing? Y  Comment her caregivers help with bathing and dressing due to her vision   Doing errands, shopping? Y  Comment caregivers do all of her errands and  also drives her to all appointments-she turned her drivers license in 7 years ago  Conservation officer, nature and eating ? Y  Comment caregivers prepare her food for her, she can feed herself  Using the Toilet? N  In the past six months, have you accidently leaked urine? Y  Comment wears adult briefs all the time  Do you have problems with loss of bowel control? N  Managing your Medications? Y  Comment she gets her medications from the pharmacy in blister packs  Managing your Finances? Y  Comment her caregivers help with writing her checks  Housekeeping or managing your Housekeeping? Y  Comment caregivers do all the housekeeping  Some recent data might be hidden    Patient Education/ Literacy How often do you need to have someone help you when you read instructions, pamphlets, or other written materials from your doctor or pharmacy?: 5 - Always(she could read prior to her vision going bad 6 years ago) What is the last grade level you completed in school?: GED  Exercise Current Exercise Habits: The patient does not participate in regular exercise at present, Exercise limited by: cardiac condition(s);respiratory conditions(s);Other - see comments(vision impairment)  Diet Patient reports consuming 2 meals a day and 1 snack(s) a day Patient reports that her primary diet is: Regular Patient reports that she does have regular access to food.   Depression Screen PHQ 2/9 Scores 12/16/2018 09/11/2018  PHQ - 2 Score 0 0     Fall Risk Fall Risk  12/16/2018 09/11/2018  Falls in the past year? 0 0  Number falls in past yr: 0 -  Injury with Fall? 0 -  Risk for fall due to : Impaired vision;Impaired mobility -  Follow up Falls prevention discussed -  Comment Get rid of all throw rugs in the house, adequate lighting in the walkways and grab bars in the bathroom -     Objective:  Roylene Reason seemed alert and oriented and she  participated appropriately during our telephone visit.  Blood Pressure Weight BMI  BP Readings from Last 3 Encounters:  09/11/18 (!) 155/65  09/03/18 (!) 170/60  03/31/18 135/66   Wt Readings from Last 3 Encounters:  09/11/18 200 lb (90.7 kg)  09/03/18 197 lb (89.4 kg)  03/31/18 202 lb (91.6 kg)   BMI Readings from Last 1 Encounters:  09/11/18 31.80 kg/m    *Unable to obtain current vital signs, weight, and BMI due to telephone visit type  Hearing/Vision  . Illeana did not seem to have difficulty with hearing/understanding during the telephone conversation . Reports that she has had a formal eye exam by an eye care professional within the past year . Reports that she has not had a formal hearing evaluation within the past year *Unable to fully assess hearing and vision during telephone visit type  Cognitive Function: 6CIT Screen 12/16/2018  What Year? 0 points  What month? 0 points  What time? 0 points  Count back from 20 0 points  Months in reverse 0 points  Repeat phrase 0 points  Total Score 0   (Normal:0-7, Significant for Dysfunction: >8)  Normal Cognitive Function Screening: Yes   Immunization & Health Maintenance Record Immunization History  Administered Date(s) Administered  . Fluad Quad(high Dose 65+) 11/18/2018  . Influenza Split 12/01/2009, 11/18/2011  . Influenza, High Dose Seasonal PF 02/12/2017, 12/12/2017  . Influenza, Seasonal, Injecte, Preservative Fre 12/01/2013  . Influenza,inj,Quad PF,6+ Mos 10/30/2012  . Influenza-Unspecified 02/09/2011, 01/14/2015, 12/30/2015  . Pneumococcal Conjugate-13  01/14/2015  . Pneumococcal-Unspecified 12/04/1999, 02/19/2006  . Zoster 12/05/2011    Health Maintenance  Topic Date Due  . Janet Berlin  05/21/1949  . DEXA SCAN  05/20/1997  . INFLUENZA VACCINE  Completed  . PNA vac Low Risk Adult  Completed       Assessment  This is a routine wellness examination for Julie Burns.  Health Maintenance: Due or  Overdue Health Maintenance Due  Topic Date Due  . TETANUS/TDAP  05/21/1949  . DEXA SCAN  05/20/1997    Julie Burns does not need a referral for Community Assistance: Care Management:   no Social Work:    no Prescription Assistance:  no Nutrition/Diabetes Education:  no   Plan:  Personalized Goals Goals Addressed            This Visit's Progress   . DIET - INCREASE WATER INTAKE       Try to drink 6-8 glasses of water daily.      Personalized Health Maintenance & Screening Recommendations  Td vaccine  Lung Cancer Screening Recommended: no (Low Dose CT Chest recommended if Age 67-80 years, 30 pack-year currently smoking OR have quit w/in past 15 years) Hepatitis C Screening recommended: no HIV Screening recommended: no  Advanced Directives: Written information was not prepared per patient's request.  Referrals & Orders No orders of the defined types were placed in this encounter.   Follow-up Plan . Follow-up with Mechele Claude, MD as planned . Consider TDAP vaccine at your next visit with your PCP   I have personally reviewed and noted the following in the patient's chart:   . Medical and social history . Use of alcohol, tobacco or illicit drugs  . Current medications and supplements . Functional ability and status . Nutritional status . Physical activity . Advanced directives . List of other physicians . Hospitalizations, surgeries, and ER visits in previous 12 months . Vitals . Screenings to include cognitive, depression, and falls . Referrals and appointments  In addition, I have reviewed and discussed with Julie Burns certain preventive protocols, quality metrics, and best practice recommendations. A written personalized care plan for preventive services as well as general preventive health recommendations is available and can be mailed to the patient at her request.      , Vivien Rossetti, LPN  16/12/9602   I have reviewed and agree with the  above AWV documentation.  Mechele Claude, M.D.

## 2018-12-16 NOTE — Patient Instructions (Signed)
Preventive Care 83 Years and Older, Female Preventive care refers to lifestyle choices and visits with your health care provider that can promote health and wellness. This includes:  A yearly physical exam. This is also called an annual well check.  Regular dental and eye exams.  Immunizations.  Screening for certain conditions.  Healthy lifestyle choices, such as diet and exercise. What can I expect for my preventive care visit? Physical exam Your health care provider will check:  Height and weight. These may be used to calculate body mass index (BMI), which is a measurement that tells if you are at a healthy weight.  Heart rate and blood pressure.  Your skin for abnormal spots. Counseling Your health care provider may ask you questions about:  Alcohol, tobacco, and drug use.  Emotional well-being.  Home and relationship well-being.  Sexual activity.  Eating habits.  History of falls.  Memory and ability to understand (cognition).  Work and work Statistician.  Pregnancy and menstrual history. What immunizations do I need?  Influenza (flu) vaccine  This is recommended every year. Tetanus, diphtheria, and pertussis (Tdap) vaccine  You may need a Td booster every 10 years. Varicella (chickenpox) vaccine  You may need this vaccine if you have not already been vaccinated. Zoster (shingles) vaccine  You may need this after age 33. Pneumococcal conjugate (PCV13) vaccine  One dose is recommended after age 33. Pneumococcal polysaccharide (PPSV23) vaccine  One dose is recommended after age 72. Measles, mumps, and rubella (MMR) vaccine  You may need at least one dose of MMR if you were born in 1957 or later. You may also need a second dose. Meningococcal conjugate (MenACWY) vaccine  You may need this if you have certain conditions. Hepatitis A vaccine  You may need this if you have certain conditions or if you travel or work in places where you may be exposed  to hepatitis A. Hepatitis B vaccine  You may need this if you have certain conditions or if you travel or work in places where you may be exposed to hepatitis B. Haemophilus influenzae type b (Hib) vaccine  You may need this if you have certain conditions. You may receive vaccines as individual doses or as more than one vaccine together in one shot (combination vaccines). Talk with your health care provider about the risks and benefits of combination vaccines. What tests do I need? Blood tests  Lipid and cholesterol levels. These may be checked every 5 years, or more frequently depending on your overall health.  Hepatitis C test.  Hepatitis B test. Screening  Lung cancer screening. You may have this screening every year starting at age 39 if you have a 30-pack-year history of smoking and currently smoke or have quit within the past 15 years.  Colorectal cancer screening. All adults should have this screening starting at age 36 and continuing until age 15. Your health care provider may recommend screening at age 23 if you are at increased risk. You will have tests every 1-10 years, depending on your results and the type of screening test.  Diabetes screening. This is done by checking your blood sugar (glucose) after you have not eaten for a while (fasting). You may have this done every 1-3 years.  Mammogram. This may be done every 1-2 years. Talk with your health care provider about how often you should have regular mammograms.  BRCA-related cancer screening. This may be done if you have a family history of breast, ovarian, tubal, or peritoneal cancers.  Other tests  Sexually transmitted disease (STD) testing.  Bone density scan. This is done to screen for osteoporosis. You may have this done starting at age 55. Follow these instructions at home: Eating and drinking  Eat a diet that includes fresh fruits and vegetables, whole grains, lean protein, and low-fat dairy products. Limit  your intake of foods with high amounts of sugar, saturated fats, and salt.  Take vitamin and mineral supplements as recommended by your health care provider.  Do not drink alcohol if your health care provider tells you not to drink.  If you drink alcohol: ? Limit how much you have to 0-1 drink a day. ? Be aware of how much alcohol is in your drink. In the [place] one drink equals one 12 oz bottle of beer (355 mL), one 5 oz glass of wine (148 mL), or one 1 oz glass of hard liquor (44 mL). Lifestyle  Take daily care of your teeth and gums.  Stay active. Exercise for at least 30 minutes on 5 or more days each week.  Do not use any products that contain nicotine or tobacco, such as cigarettes, e-cigarettes, and chewing tobacco. If you need help quitting, ask your health care provider.  If you are sexually active, practice safe sex. Use a condom or other form of protection in order to prevent STIs (sexually transmitted infections).  Talk with your health care provider about taking a low-dose aspirin or statin. What's next?  Go to your health care provider once a year for a well check visit.  Ask your health care provider how often you should have your eyes and teeth checked.  Stay up to date on all vaccines. This information is not intended to replace advice given to you by your health care provider. Make sure you discuss any questions you have with your health care provider. Document Released: 03/18/2015 Document Revised: 02/13/2018 Document Reviewed: 02/13/2018 Elsevier Patient Education  2020 [employer].

## 2019-01-14 ENCOUNTER — Telehealth: Payer: Self-pay | Admitting: Family Medicine

## 2019-01-14 NOTE — Telephone Encounter (Signed)
Patient aware she can not come in

## 2019-01-22 ENCOUNTER — Other Ambulatory Visit: Payer: Self-pay

## 2019-01-22 DIAGNOSIS — Z20822 Contact with and (suspected) exposure to covid-19: Secondary | ICD-10-CM

## 2019-01-24 LAB — NOVEL CORONAVIRUS, NAA: SARS-CoV-2, NAA: DETECTED — AB

## 2019-02-25 ENCOUNTER — Other Ambulatory Visit: Payer: Self-pay | Admitting: Cardiology

## 2019-02-25 ENCOUNTER — Other Ambulatory Visit: Payer: Self-pay

## 2019-02-25 ENCOUNTER — Ambulatory Visit (INDEPENDENT_AMBULATORY_CARE_PROVIDER_SITE_OTHER): Payer: Medicare Other

## 2019-02-25 DIAGNOSIS — I6523 Occlusion and stenosis of bilateral carotid arteries: Secondary | ICD-10-CM

## 2019-03-04 ENCOUNTER — Telehealth: Payer: Self-pay | Admitting: Cardiology

## 2019-03-04 NOTE — Telephone Encounter (Signed)
VAS US CAROTID: Result Notes  Annita Brod, RN  03/04/2019 12:09 PM EST    Informed pt of the following:  "Right Carotid: Velocities in the right ICA are consistent with a 40-59%        stenosis.  Left Carotid: Velocities in the left ICA are consistent with a 1-39% stenosis. Follow up in one year     Call Ms. Eichholz with the results and send results to Claretta Fraise, MD"  Pt verbalized understanding. Results forwarded to Claretta Fraise, MD. Pt would like repeat echo to check on AVR. Last echo performed 05/2017 and pt would like repeat if Dr. Percival Spanish okay to order. Informed patient that message to be routed to Dr. Percival Spanish to advise   Awaiting Dr. Percival Spanish response

## 2019-03-04 NOTE — Telephone Encounter (Signed)
Patient called in regards to her recent Echo - was wanting to get results.  Please call [phone]

## 2019-03-10 ENCOUNTER — Other Ambulatory Visit: Payer: Self-pay | Admitting: *Deleted

## 2019-03-11 ENCOUNTER — Ambulatory Visit (INDEPENDENT_AMBULATORY_CARE_PROVIDER_SITE_OTHER): Payer: Medicare Other

## 2019-03-11 ENCOUNTER — Other Ambulatory Visit: Payer: Self-pay

## 2019-03-11 DIAGNOSIS — E1122 Type 2 diabetes mellitus with diabetic chronic kidney disease: Secondary | ICD-10-CM

## 2019-03-11 DIAGNOSIS — I69354 Hemiplegia and hemiparesis following cerebral infarction affecting left non-dominant side: Secondary | ICD-10-CM

## 2019-03-11 DIAGNOSIS — J9621 Acute and chronic respiratory failure with hypoxia: Secondary | ICD-10-CM

## 2019-03-11 DIAGNOSIS — M3500 Sicca syndrome, unspecified: Secondary | ICD-10-CM

## 2019-03-11 DIAGNOSIS — I130 Hypertensive heart and chronic kidney disease with heart failure and stage 1 through stage 4 chronic kidney disease, or unspecified chronic kidney disease: Secondary | ICD-10-CM

## 2019-03-11 DIAGNOSIS — J449 Chronic obstructive pulmonary disease, unspecified: Secondary | ICD-10-CM

## 2019-03-11 DIAGNOSIS — E1151 Type 2 diabetes mellitus with diabetic peripheral angiopathy without gangrene: Secondary | ICD-10-CM

## 2019-03-11 DIAGNOSIS — I252 Old myocardial infarction: Secondary | ICD-10-CM

## 2019-03-11 DIAGNOSIS — I2510 Atherosclerotic heart disease of native coronary artery without angina pectoris: Secondary | ICD-10-CM

## 2019-03-11 DIAGNOSIS — Z952 Presence of prosthetic heart valve: Secondary | ICD-10-CM

## 2019-03-11 DIAGNOSIS — I4891 Unspecified atrial fibrillation: Secondary | ICD-10-CM

## 2019-03-11 DIAGNOSIS — E039 Hypothyroidism, unspecified: Secondary | ICD-10-CM

## 2019-03-11 DIAGNOSIS — N183 Chronic kidney disease, stage 3 unspecified: Secondary | ICD-10-CM

## 2019-03-11 DIAGNOSIS — I5031 Acute diastolic (congestive) heart failure: Secondary | ICD-10-CM

## 2019-03-11 DIAGNOSIS — M810 Age-related osteoporosis without current pathological fracture: Secondary | ICD-10-CM

## 2019-03-11 DIAGNOSIS — H442B9 Degenerative myopia with macular hole, unspecified eye: Secondary | ICD-10-CM

## 2019-03-16 ENCOUNTER — Other Ambulatory Visit: Payer: Self-pay | Admitting: *Deleted

## 2019-03-17 NOTE — Progress Notes (Signed)
Virtual Visit via Telephone Note   This visit type was conducted due to national recommendations for restrictions regarding the COVID-19 Pandemic (e.g. social distancing) in an effort to limit this patient's exposure and mitigate transmission in our community.  Due to her co-morbid illnesses, this patient is at least at moderate risk for complications without adequate follow up.  This format is felt to be most appropriate for this patient at this time.  The patient did not have access to video technology/had technical difficulties with video requiring transitioning to audio format only (telephone).  All issues noted in this document were discussed and addressed.  No physical exam could be performed with this format.  Please refer to the patient's chart for her  consent to telehealth for [facility].   Date:  03/18/2019   ID:  Julie Burns, DOB 02-24-1931, MRN [id number]  Patient Location: Home Provider Location: Home  PCP:  Kirstie Peri, MD  Cardiologist:  Rollene Rotunda, MD  Electrophysiologist:  None   Evaluation Performed:  Follow-Up Visit  Chief Complaint:  AVR  History of Present Illness:    Julie Burns is a 86 y.o. female who presents for follow-up of CHF and atrial fibrillation.  She had AVR in 2010 (59mm Magna valve)and PAF on anticoagulation and amiodarone.  Of note she had a cardioversion in 2017 with post cardioversion pause requiring atropine.  She also has CAD with DES to the RCA staged PCI to the LAD and circumflex in 2015.  She was thought not to be a CABG candidate.    She had COVID in October.  She has chronic shortness of breath related to COPD and is on 2-1/2 L every day.  She has had no significant change however.  Her breathing seems to be at baseline she is not describing new PND or orthopnea.  She denies any palpitations, presyncope or syncope.  She does have some leg swelling but this seems to be stable.  The patient does not have symptoms concerning for  COVID-19 infection (fever, chills, cough, or new shortness of breath).    Past Medical History:  Diagnosis Date  . Age-related osteoporosis without current pathological fracture   . Anxiety disorder   . Atrial fibrillation (HCC)   . CAD (coronary artery disease)    12/2013-DES to RCA due to NSTEMI- staged PCI to LAD and CX (not ideal for CABG; Echocardiogram 01/16/10 EF 55%, severe LVH; Moderate AS; mild ot moderate TR  . Chronic kidney disease   . Chronic respiratory failure with hypoxia (HCC)   . COPD (chronic obstructive pulmonary disease) (HCC)   . Degenerative myopia with macular hole   . Diabetes (HCC)    TYPE 2  . Hyperlipidemia   . Hypothyroidism   . Irritable bowel syndrome without diarrhea   . NSTEMI (non-ST elevated myocardial infarction) (HCC)    NSTEMI 3/05; s/p PTCA with stenting of left circumflex coronary artery 3/05; totally occluded small vessel OM  . Occlusion and stenosis of right carotid artery    Carotid Duplex 7/13 moderate disease (<70%) of the right internal carotid arteries, this is a high bifurcation, s/p left CEA without significant stenosis.  . Polyp of colon   . Presence of prosthetic heart valve    S/P aortic valve replacement 04/29/2008   . PVD (peripheral vascular disease) (HCC)   . Sicca syndrome (HCC)   . Stroke (cerebrum) (HCC)   . Unspecified abdominal hernia without obstruction or gangrene   . Vitamin D deficiency  Past Surgical History:  Procedure Laterality Date  . AORTIC VALVE REPLACEMENT     S/P aortic valve replacement 04/29/2008   . CARDIAC CATHETERIZATION     by Flonnie Hailstone at [facility]; mLAD 30%, patent LCx stent, mRCA 50%, LVEDP 10, severe AS with AV mean gradient 46 mmHg, Right heart catheterization   . CAROTID ENDARTERECTOMY     Carotid Duplex 7/13 moderate disease (<70%) of the right internal carotid arteries, this is a high bifurcation, s/p left CEA without significant stenosis.    Prior to Admission medications   Medication  Sig Start Date End Date Taking? Authorizing Provider  alendronate (FOSAMAX) 70 MG tablet Take 70 mg by mouth once a week. Take with a full glass of water on an empty stomach. Take on Sunday's.   Yes [provider]  amiodarone (PACERONE) 200 MG tablet Take 200 mg by mouth daily.   Yes [provider]  apixaban (ELIQUIS) 5 MG TABS tablet Take 5 mg by mouth 2 (two) times daily.   Yes [provider]  budesonide (PULMICORT) 0.5 MG/2ML nebulizer solution Take 2 mLs (0.5 mg total) by nebulization 2 (two) times daily. 12/15/18  Yes Stacks, Broadus John, MD  Cholecalciferol (VITAMIN D3) 5000 units CAPS Take 1 capsule by mouth daily.   Yes [provider]  furosemide (LASIX) 40 MG tablet Take 1 tablet (40 mg total) by mouth 2 (two) times daily. 05/21/17  Yes Tat, Onalee Hua, MD  Ipratropium-Albuterol (COMBIVENT RESPIMAT) 20-100 MCG/ACT AERS respimat Inhale 1-2 puffs into the lungs every 4 (four) hours as needed for wheezing or shortness of breath. 05/10/17  Yes [provider]  ipratropium-albuterol (DUONEB) 0.5-2.5 (3) MG/3ML SOLN Take 3 mLs by nebulization every 6 (six) hours as needed.   Yes [provider]  levothyroxine (SYNTHROID, LEVOTHROID) 75 MCG tablet 75 mcg. Take one tablet daily by mouth except Mon. And Thurs. Take 1 1/2 tablets   Yes [provider]  lisinopril (PRINIVIL,ZESTRIL) 20 MG tablet Take 20 mg by mouth daily.   Yes [provider]  LORazepam (ATIVAN) 0.5 MG tablet Take 0.5 mg by mouth 2 (two) times daily as needed for anxiety or sleep.    Yes [provider]  Melatonin-Pyridoxine (MELATONIN TR WITH VITAMIN B6) 3-10 MG TBCR Take 1 capsule by mouth at bedtime as needed (sleep).    Yes [provider]  metoprolol succinate (TOPROL-XL) 100 MG 24 hr tablet Take 50 mg by mouth daily. Takes 50 mg tablet daily. If blood pressure is elevated take additional 50 mg tablet.   Yes [provider]  Misc. Devices MISC  underpads due to urinary incontinence 12/02/17  Yes [provider]  Multiple Vitamins-Minerals (ICAPS AREDS FORMULA PO) Take 1 capsule by mouth 2 (two) times daily.    Yes [provider]  nitroGLYCERIN (NITROSTAT) 0.4 MG SL tablet PLACE 1 TABLET UNDER THE TONGUE AT ONSET OF CHEST PAIN EVERY 5 MINTUES UP TO 3 TIMES AS NEEDED 11/20/18  Yes Rollene Rotunda, MD  nystatin-triamcinolone ointment (MYCOLOG) Apply 1 application topically 2 (two) times daily as needed (rash).    Yes [provider]  ondansetron (ZOFRAN) 4 MG tablet Take 4 mg by mouth every 8 (eight) hours as needed for nausea or vomiting.   Yes [provider]  OXYGEN Inhale into the lungs.   Yes [provider]  polyethylene glycol (MIRALAX / GLYCOLAX) packet Take 17 g by mouth daily.   Yes [provider]  traMADol (ULTRAM) 50 MG tablet  Take 50 mg by mouth every 6 (six) hours as needed for moderate pain.    Yes [provider]  isosorbide mononitrate (IMDUR) 60 MG 24 hr tablet Take 1 tablet (60 mg total) by mouth daily. 09/03/18 12/02/18  Minus Breeding, MD     Allergies:   Aleve [naproxen sodium], Bee venom, Codeine, Ibuprofen, Morphine and related, Norvasc [amlodipine besylate], Sulfa antibiotics, Tylenol [acetaminophen], Valium [diazepam], Zetia [ezetimibe], and Penicillins   Social History   Tobacco Use  . Smoking status: Former Smoker    Packs/day: 3.00    Types: Cigarettes    Quit date: 10/23/1976    Years since quitting: 42.4  . Smokeless tobacco: Never Used  Substance Use Topics  . Alcohol use: No  . Drug use: No     Family Hx: The patient's family history includes CAD in her brother, father, mother, and sister; COPD in her son; Diabetes in her brother and brother; Hypertension in her brother, brother, father, mother, and sister.  ROS:   Please see the history of present illness.     All other systems reviewed and are negative.   Prior CV studies:   The  following studies were reviewed today:  None  Labs/Other Tests and Data Reviewed:    EKG:  No ECG reviewed.  Recent Labs: No results found for requested labs within last 8760 hours.   Recent Lipid Panel No results found for: CHOL, TRIG, HDL, CHOLHDL, LDLCALC, LDLDIRECT  Wt Readings from Last 3 Encounters:  09/11/18 200 lb (90.7 kg)  09/03/18 197 lb (89.4 kg)  03/31/18 202 lb (91.6 kg)     Objective:    Vital Signs:  BP (!) 165/82   Pulse (!) 58    VITAL SIGNS:  reviewed  ASSESSMENT & PLAN:    ATRIAL FIB:    She has not noticed fibrillation.  She is having no symptoms related to this.  She seems to tolerate the amiodarone and she is going to get TSH and liver enzymes at her primary care office soon.  No change in therapy.  She has been taken off of anticoagulation because of frequent nosebleeds and the fact that we think she is maintaining hopefully sinus rhythm.  CAD:   The patient has no new sypmtoms.  No further cardiovascular testing is indicated.  We will continue with aggressive risk reduction and meds as listed.    AVR:  I will follow this up with echocardiography in the future.  CEA:   She is up-to-date with follow-up.  She has moderate stenosis on the right and we reviewed this today.  She will be scheduled for follow-up Doppler in 1 year.  DYSLIPIDEMIA: I do not have the most recent LDL but she is going to get this done at her primary office and I be happy to review.  She has not wanted any therapies in the past.  HTN: Her blood pressure is elevated.  However, I spoke with her and her caregiver and they say that it will come down and she had a stressful morning.   COVID-19 Education: The signs and symptoms of COVID-19 were discussed with the patient and how to seek care for testing (follow up with PCP or arrange E-visit).  She does not want the vaccine.  The importance of social distancing was discussed today.  Time:   Today, I have spent 25 minutes with the  patient with telehealth technology discussing the above problems.     Medication Adjustments/Labs and Tests Ordered: Current  medicines are reviewed at length with the patient today.  Concerns regarding medicines are outlined above.   Tests Ordered: No orders of the defined types were placed in this encounter.   Medication Changes: No orders of the defined types were placed in this encounter.   Follow Up:  see me in one year.       Signed, Rollene Rotunda, MD  03/18/2019 12:48 PM    [facility]

## 2019-03-18 ENCOUNTER — Encounter: Payer: Self-pay | Admitting: Cardiology

## 2019-03-18 ENCOUNTER — Encounter: Payer: Self-pay | Admitting: Internal Medicine

## 2019-03-18 ENCOUNTER — Other Ambulatory Visit: Payer: Self-pay

## 2019-03-18 ENCOUNTER — Telehealth (INDEPENDENT_AMBULATORY_CARE_PROVIDER_SITE_OTHER): Payer: Medicare Other | Admitting: Cardiology

## 2019-03-18 VITALS — BP 165/82 | HR 58

## 2019-03-18 DIAGNOSIS — Z952 Presence of prosthetic heart valve: Secondary | ICD-10-CM | POA: Diagnosis not present

## 2019-03-18 DIAGNOSIS — I2510 Atherosclerotic heart disease of native coronary artery without angina pectoris: Secondary | ICD-10-CM | POA: Diagnosis not present

## 2019-03-18 DIAGNOSIS — I10 Essential (primary) hypertension: Secondary | ICD-10-CM | POA: Diagnosis not present

## 2019-03-18 DIAGNOSIS — E785 Hyperlipidemia, unspecified: Secondary | ICD-10-CM | POA: Diagnosis not present

## 2019-03-18 NOTE — Patient Instructions (Signed)
Medication Instructions:  No changes *If you need a refill on your cardiac medications before your next appointment, please call your pharmacy*  Lab Work: NONE  Testing/Procedures: NONE  Follow-Up: At [facility], you and your health needs are our priority.  As part of our continuing mission to provide you with exceptional heart care, we have created designated Provider Care Teams.  These Care Teams include your primary Cardiologist (physician) and Advanced Practice Providers (APPs -  Physician Assistants and Nurse Practitioners) who all work together to provide you with the care you need, when you need it.  Your next appointment:   1 year(s)  The format for your next appointment:   In Person  Provider:   James Hochrein, MD  

## 2019-03-20 ENCOUNTER — Other Ambulatory Visit: Payer: Self-pay | Admitting: Family Medicine

## 2019-03-20 NOTE — Telephone Encounter (Signed)
What is the name of the medication? Benzonatate 200 mg Take 1 table 3 times daily as needed  Have you contacted your pharmacy to request a refill? YES  Which pharmacy would you like this sent to? [facility]   Patient notified that their request is being sent to the clinical staff for review and that they should receive a call once it is complete. If they do not receive a call within 24 hours they can check with their pharmacy or our office.

## 2019-03-20 NOTE — Telephone Encounter (Signed)
PCP is now Dr. Sherryll Burger, [facility] pharmacy will send to that provider

## 2019-04-02 ENCOUNTER — Ambulatory Visit: Payer: Medicare Other | Admitting: Gastroenterology

## 2019-04-07 NOTE — Progress Notes (Signed)
Referring Provider: Kirstie Peri, MD Primary Care Physician:  Kirstie Peri, MD Primary Gastroenterologist:  Dr. Jena Gauss   Chief Complaint  Patient presents with  . Nausea    constipation    HPI:   Julie Burns is a 86 y.o. female presenting today at the request of Kirstie Peri, MD for nausea.  Several chronic medical conditions including CHF (although last echo in March 2019 with mild LVH, EF 65-70%), atrial fibrillation, CAD s/p DES in 2015, aortic valve replacement, PVD, COPD on 2.5-3 L supplemental O2, hypothyroidism, diabetes, CKD, HTN, HLD, stroke, anxiety, and sicca syndrome.   Reviewed most recent labs in Care Everywhere from June 2020.  CBC within normal limits.  CMP essentially normal with mildly low GFR at 59.  Hemoglobin A1c 5.8  Today: Presents with her aide who helps provide history, but patient can speak for herself.   Constipation: Takes 2 stool softeners and MiraLAX. As long as she takes this daily, constipation is well managed. Will have a BM ranging from twice a day to every other day. Occasional lower abdominal pain prior to having a BM and resolves after. Not regularly. No blood in the stool or black stools. States she just wanted to get established with someone in case she started having trouble.   Several colonoscopies in the past. These were in [facility]. Last TCS 5-10 years ago. Reports a couple polyps in the past.  Nausea: Will wake in the morning with nausea at times. Sometimes before eating, other times after eating. Ate a tomato sandwich and had nausea yesterday. Nausea has been intermittent for the last 5 years. No worsening of symptoms. Symptoms have stayed the same. Frequency ranges from 3-4 times a week to no trouble for 1 month. Taking Zofran as needed. This resolves the nausea. No vomiting.  No abdominal pain.  Used to have reflux in the past. Was on omeprazole. This was stopped at least 1 year ago and she is now on pepcid. Rare heartburn or acid reflux. No  dysphagia. Admits to intermittent chest pressure. State she isn't sure if this is her heart or reflux but will take nitro and symptoms resolve. Occurs a couple times a month. No NSAIDs.   Eats within 3 hours of going to bed at times. Sometimes gets up in the night and eats. States she eats fried foods at least 2-3 times a week. Fried potatoes and onions. Also eating a lot of spicy foods. Loves jalapeno. Drinks club soda.  Drinks tomato juice. Coffee every morning. Lost a little weight with COVID-19 in November. Now gaining weight back.   Has abdominal hernia. Has been operated on 3 times. Stated she can't get put to sleep any more. Had trouble waking up.   Losing her sight. Can't drive, or see to read and write.   Continues with mild fatigue since COVID-19.  No fever, chills. Has intermittent lightheadedness with position changes. No syncope. Intermittent chest tightness. No palpitations. Chronic shortness of breath with COPD. Using 3L O2. Coughs up phlegm at times.   BP is elevated today.  No headache or current chest pain.  Per patient's aid, they check her BP daily and it is typically in the 140s systolic.  Reports taking Eliquis. Per last cardiology note, assessment stated patient has been taken off of Eliquis due to recurrent nosebleeds.    Past Medical History:  Diagnosis Date  . Age-related osteoporosis without current pathological fracture   . Anxiety disorder   . Atrial fibrillation (HCC)   .  CAD (coronary artery disease)    12/2013-DES to RCA due to NSTEMI- staged PCI to LAD and CX (not ideal for CABG; Echocardiogram 01/16/10 EF 55%, severe LVH; Moderate AS; mild ot moderate TR  . Chronic kidney disease   . Chronic respiratory failure with hypoxia ([place])   . COPD (chronic obstructive pulmonary disease) ([place])   . Degenerative myopia with macular hole   . Diabetes ([place])    TYPE 2  . Hyperlipidemia   . Hypothyroidism   . Irritable bowel syndrome without diarrhea   . NSTEMI (non-ST  elevated myocardial infarction) ([place])    NSTEMI 3/05; s/p PTCA with stenting of left circumflex coronary artery 3/05; totally occluded small vessel OM  . Occlusion and stenosis of right carotid artery    Carotid Duplex 7/13 moderate disease (<70%) of the right internal carotid arteries, this is a high bifurcation, s/p left CEA without significant stenosis.  . Polyp of colon   . Presence of prosthetic heart valve    S/P aortic valve replacement 04/29/2008   . PVD (peripheral vascular disease) ([place])   . Sicca syndrome ([place])   . Stroke (cerebrum) ([place])   . Unspecified abdominal hernia without obstruction or gangrene   . Vitamin D deficiency     Past Surgical History:  Procedure Laterality Date  . ABDOMINAL HERNIA REPAIR     per patient x 3  . AORTIC VALVE REPLACEMENT     S/P aortic valve replacement 04/29/2008   . CARDIAC CATHETERIZATION     by Janice Norrie at [facility]; mLAD 30%, patent LCx stent, mRCA 50%, LVEDP 10, severe AS with AV mean gradient 46 mmHg, Right heart catheterization   . CAROTID ENDARTERECTOMY     Carotid Duplex 7/13 moderate disease (<70%) of the right internal carotid arteries, this is a high bifurcation, s/p left CEA without significant stenosis.  . COLONOSCOPY     per patient- several in the past in [facility], couple of polyps, last TCS between 5-10 years ago.     Current Outpatient Medications  Medication Sig Dispense Refill  . alendronate (FOSAMAX) 70 MG tablet Take 70 mg by mouth once a week. Take with a full glass of water on an empty stomach. Take on Sunday's.    . amiodarone (PACERONE) 200 MG tablet Take 200 mg by mouth daily.    [name] apixaban (ELIQUIS) 5 MG TABS tablet Take 5 mg by mouth 2 (two) times daily.    . budesonide (PULMICORT) 0.5 MG/2ML nebulizer solution Take 2 mLs (0.5 mg total) by nebulization 2 (two) times daily. 120 mL 5  . Cholecalciferol (VITAMIN D3) 5000 units CAPS Take 1 capsule by mouth daily.    . furosemide (LASIX) 40 MG tablet Take 1 tablet  (40 mg total) by mouth 2 (two) times daily. 30 tablet 1  . Ipratropium-Albuterol (COMBIVENT RESPIMAT) 20-100 MCG/ACT AERS respimat Inhale 1-2 puffs into the lungs every 4 (four) hours as needed for wheezing or shortness of breath.    [name] ipratropium-albuterol (DUONEB) 0.5-2.5 (3) MG/3ML SOLN Take 3 mLs by nebulization every 6 (six) hours as needed.    . isosorbide mononitrate (IMDUR) 60 MG 24 hr tablet Take 1 tablet (60 mg total) by mouth daily. 90 tablet 3  . levothyroxine (SYNTHROID, LEVOTHROID) 75 MCG tablet 75 mcg. Take one tablet daily by mouth except Mon. And Thurs. Take 1 1/2 tablets    . lisinopril (PRINIVIL,ZESTRIL) 20 MG tablet Take 20 mg by mouth daily.    [name] LORazepam (ATIVAN) 0.5 MG  tablet Take 0.5 mg by mouth 2 (two) times daily as needed for anxiety or sleep.     . metoprolol succinate (TOPROL-XL) 100 MG 24 hr tablet Take 50 mg by mouth daily. Takes 50 mg tablet daily. If blood pressure is elevated take additional 50 mg tablet.    . Misc. Devices MISC underpads due to urinary incontinence    . Multiple Vitamins-Minerals (ICAPS AREDS FORMULA PO) Take 1 capsule by mouth 2 (two) times daily.     . nitroGLYCERIN (NITROSTAT) 0.4 MG SL tablet PLACE 1 TABLET UNDER THE TONGUE AT ONSET OF CHEST PAIN EVERY 5 MINTUES UP TO 3 TIMES AS NEEDED 25 tablet 3  . nystatin-triamcinolone ointment (MYCOLOG) Apply 1 application topically 2 (two) times daily as needed (rash).     . ondansetron (ZOFRAN) 4 MG tablet Take 4 mg by mouth every 8 (eight) hours as needed for nausea or vomiting.    . OXYGEN Inhale into the lungs.    . polyethylene glycol (MIRALAX / GLYCOLAX) packet Take 17 g by mouth daily.    . traMADol (ULTRAM) 50 MG tablet Take 50 mg by mouth every 6 (six) hours as needed for moderate pain.     . pantoprazole (PROTONIX) 40 MG tablet Take 1 tablet (40 mg total) by mouth daily. 30 tablet 5   No current facility-administered medications for this visit.    Allergies as of 04/08/2019 - Review  Complete 04/08/2019  Allergen Reaction Noted  . Aleve [naproxen sodium] Other (See Comments) 12/16/2018  . Bee venom Swelling 05/27/2012  . Codeine Hives 05/27/2012  . Ibuprofen Other (See Comments) 12/16/2018  . Morphine and related Other (See Comments) 05/27/2012  . Norvasc [amlodipine besylate] Swelling   . Sulfa antibiotics  05/27/2012  . Tylenol [acetaminophen] Other (See Comments) 12/16/2018  . Valium [diazepam]  05/27/2012  . Zetia [ezetimibe]    . Penicillins Hives and Rash 05/27/2012    Family History  Problem Relation Age of Onset  . Hypertension Mother   . CAD Mother   . Hypertension Father   . CAD Father   . Hypertension Sister   . CAD Sister   . Hypertension Brother   . CAD Brother   . Diabetes Brother   . Hypertension Brother   . Diabetes Brother   . COPD Son     Social History   Socioeconomic History  . Marital status: Divorced    Spouse name: Not on file  . Number of children: 6  . Years of education: Not on file  . Highest education level: GED or equivalent  Occupational History  . Occupation: retired  Tobacco Use  . Smoking status: Former Smoker    Packs/day: 3.00    Types: Cigarettes    Quit date: 10/23/1976    Years since quitting: 42.4  . Smokeless tobacco: Never Used  Substance and Sexual Activity  . Alcohol use: No  . Drug use: No  . Sexual activity: Not Currently  Other Topics Concern  . Not on file  Social History Narrative   Pt lives at home alone but has a caregiver come in 7 days a week and talks with her daughter daily.   Social Determinants of Health   Financial Resource Strain: Low Risk   . Difficulty of Paying Living Expenses: Not hard at all  Food Insecurity: No Food Insecurity  . Worried About Programme researcher, broadcasting/film/video in the Last Year: Never true  . Ran Out of Food in the Last Year: Never  true  Transportation Needs: No Transportation Needs  . Lack of Transportation (Medical): No  . Lack of Transportation (Non-Medical): No    Physical Activity: Inactive  . Days of Exercise per Week: 0 days  . Minutes of Exercise per Session: 0 min  Stress: Stress Concern Present  . Feeling of Stress : To some extent  Social Connections: Moderately Isolated  . Frequency of Communication with Friends and Family: More than three times a week  . Frequency of Social Gatherings with Friends and Family: Three times a week  . Attends Religious Services: Never  . Active Member of Clubs or Organizations: No  . Attends Banker Meetings: Never  . Marital Status: Divorced  Catering manager Violence: Not At Risk  . Fear of Current or Ex-Partner: No  . Emotionally Abused: No  . Physically Abused: No  . Sexually Abused: No    Review of Systems: Gen: See HPI CV: See HPI Resp: See HPI GI: See HPI GU : Denies urinary burning. She has urinary incontinence.  MS: Admits to arthritis and gout in her foot. Also with lower back pain.  Derm: Reports rash under right breast. Using nystatin.  Psych: Admits to anxiety.  Heme: Denies bleeding. Admits to easy bruising.   Physical Exam: BP (!) 172/78   Pulse 66   Temp (!) 96.2 F (35.7 C) (Temporal)   Ht 5' 6.5" (1.689 m)   Wt 201 lb 6.4 oz (91.4 kg)   BMI 32.02 kg/m  General:   Alert and oriented. Pleasant and cooperative. Well-nourished and well-developed. Uses a walker. Nasal canula in place.  Head:  Normocephalic and atraumatic. Eyes:  Without icterus, sclera clear and conjunctiva pink.  Ears:  Normal auditory acuity. Lungs:  Mild crackles hear throughout. No wheezes or rhonchi. No distress.  Heart:  S1, S2 present without murmurs appreciated.  Abdomen:  +BS, soft, non-tender and non-distended. No HSM noted. No guarding or rebound. Ventral hernia that is soft, non-tender, and reducible.  Rectal:  Deferred  Msk:  Symmetrical without gross deformities.  Extremities:  With 1+ bilateral lower extremity edema. Neurologic:  Alert and  oriented x4;  grossly normal  neurologically. Skin:  Intact without significant lesions or rashes. Psych:  Normal mood and affect.

## 2019-04-08 ENCOUNTER — Encounter: Payer: Self-pay | Admitting: Gastroenterology

## 2019-04-08 ENCOUNTER — Other Ambulatory Visit: Payer: Self-pay

## 2019-04-08 ENCOUNTER — Ambulatory Visit (INDEPENDENT_AMBULATORY_CARE_PROVIDER_SITE_OTHER): Payer: Medicare Other | Admitting: Gastroenterology

## 2019-04-08 VITALS — BP 172/78 | HR 66 | Temp 96.2°F | Ht 66.5 in | Wt 201.4 lb

## 2019-04-08 DIAGNOSIS — K5900 Constipation, unspecified: Secondary | ICD-10-CM | POA: Diagnosis not present

## 2019-04-08 DIAGNOSIS — R110 Nausea: Secondary | ICD-10-CM

## 2019-04-08 MED ORDER — PANTOPRAZOLE SODIUM 40 MG PO TBEC: 40.0000 mg | DELAYED_RELEASE_TABLET | Freq: Every day | ORAL | 5 refills | Status: DC

## 2019-04-08 NOTE — Progress Notes (Signed)
Cc'ed to pcp °

## 2019-04-08 NOTE — Assessment & Plan Note (Signed)
86 year old female with several chronic medical conditions as per HPI who reports at least a 5-year history of intermittent nausea without vomiting.  Currently taking Zofran as needed which resolves nausea. Reports nausea may occur when waking in the morning, before eating, or after eating.  No associated abdominal pain.  Reports history of reflux in the past and was on omeprazole; currently on Pepcid daily for the last year with rare breakthrough reflux symptoms.  No alarm symptoms. Admits to eating within 3 hours of going to bed, sometimes in the middle of the night, consumes fried foods and spicy foods frequently, drinks soda and coffee daily.  No NSAIDs.  I suspect patient's nausea is likely related to silent reflux influenced by her dietary and lifestyle habits.  She may also have underlying gastritis.   Stop Pepcid and start Protonix 40 mg daily.  Advised to separate this at least 4 hours from levothyroxine. Explained the importance of dietary and lifestyle changes.  Advise she avoid fried, fatty, greasy, spicy, citrus foods.  Avoid caffeine, carbonated beverages, chocolate.  Do not eat within 3 hours of laying down. Continue Zofran as needed. Follow-up in 4 months.

## 2019-04-08 NOTE — Patient Instructions (Addendum)
Stop Pepcid and start Protonix 40 mg daily.  Ideally this is taken 30 minutes before breakfast but will need to be separated from your thyroid medication by 4 hours.  Please focus on dietary adjustments as I feel this is likely influencing your occasional nausea.  Avoid fried, fatty, greasy, spicy, citrus foods.  Avoid caffeine, carbonated beverages.  Do not eat within 3 hours of going to bed.    You may continue to use Zofran as needed for nausea.  Continue taking stool softeners and MiraLAX daily as this is working well for your constipation.  Follow-up with your primary care on elevated blood pressure.  Please reach out to cardiology to verify whether or not you should be on Eliquis.   We will follow up with you in 4 months.  Call if you have questions or concerns prior.  Ermalinda Memos, PA-C [facility]

## 2019-04-08 NOTE — Assessment & Plan Note (Signed)
Constipation currently well managed with 2 stool softeners and MiraLAX daily.  Occasional abdominal pain prior to BM that resolves thereafter.  No alarm symptoms.  Reports colonoscopies in the past in [place] with a couple polyps.  Last colonoscopy between 5-10 years ago.  Continue stool softeners and MiraLAX daily as this is working well. Continue to monitor symptoms. Follow-up in 4 months for nausea as discussed above.  Call if questions or concerns prior.

## 2019-04-10 ENCOUNTER — Ambulatory Visit (HOSPITAL_COMMUNITY)
Admission: RE | Admit: 2019-04-10 | Discharge: 2019-04-10 | Disposition: A | Discharge status: Home / Self Care | Payer: Medicare Other | Source: Ambulatory Visit | Attending: Cardiology | Admitting: Cardiology

## 2019-04-10 ENCOUNTER — Other Ambulatory Visit: Payer: Self-pay

## 2019-04-10 DIAGNOSIS — Z952 Presence of prosthetic heart valve: Secondary | ICD-10-CM

## 2019-04-10 NOTE — Progress Notes (Signed)
*  PRELIMINARY RESULTS* Echocardiogram 2D Echocardiogram has been performed.  Stacey Drain 04/10/2019, 2:41 PM

## 2019-05-06 ENCOUNTER — Telehealth: Payer: Self-pay | Admitting: Internal Medicine

## 2019-05-06 NOTE — Telephone Encounter (Signed)
Pt's caregiver called to say that the Pantroprazole that the patient was switched to is causing her bad heartburn and wanted to know what the provider would recommend. She has another appointment today and said to call her back tomorrow. [phone]

## 2019-05-06 NOTE — Telephone Encounter (Signed)
Will call pt tomorrow to get more information, per pt.

## 2019-05-07 NOTE — Telephone Encounter (Signed)
I had stopped Pepcid and started Protonix at her last visit to see if this would help with her nausea.  At that time, she had rare reflux symptoms but reported nausea occasionally in the mornings.  In addition to changing her medication, I made recommendations regarding diet and lifestyle as she had been consuming fried, fatty, spicy foods and soda frequently.  Was also eating within 3 hours of going to bed and sometimes eating in the middle of the night.   We have a couple of options. Stop Protonix and try different PPI to help with GERD symptoms. Stop Protonix and resume Pepcid as she did not have significant GERD symptoms prior to starting Protonix.  In addition to the above, it is very important that she is avoiding fried, fatty, greasy, spicy foods, avoiding soda, and not eating within 3 hours of going to bed or in the middle of the night.

## 2019-05-07 NOTE — Telephone Encounter (Signed)
Please have [facility] address tomorrow upon her return. In the meantime, have patient continue pantoprazole 40mg  daily (AM) and take her Pepcid with evening meal.

## 2019-05-07 NOTE — Telephone Encounter (Signed)
Noted. Spoke with pt. Her medication is packaged and she doesn't have any Pepcid at home. Pt says she has tums and is aware that [facility] will address Julie Burns when she returns.

## 2019-05-07 NOTE — Telephone Encounter (Signed)
Spoke with pt. Pt is taking Pantoprazole 40 mg once daily. Pt isn't sure if the Pantoprazole 40 mg is helping her. Pts medication was switched at her last apt 04/08/2019. Pt was previously taking Pepcid. Pt is having problems with Genella Rife and states she hasn't had burning sensations in years. Please advise in the absence of [facility].

## 2019-05-08 ENCOUNTER — Other Ambulatory Visit: Payer: Self-pay | Admitting: Gastroenterology

## 2019-05-08 DIAGNOSIS — R110 Nausea: Secondary | ICD-10-CM

## 2019-05-08 MED ORDER — FAMOTIDINE 20 MG PO TABS: 20.0000 mg | ORAL_TABLET | Freq: Two times a day (BID) | ORAL | 1 refills | Status: DC

## 2019-05-08 MED ORDER — FAMOTIDINE 20 MG PO TABS: 20.0000 mg | ORAL_TABLET | Freq: Every day | ORAL | 3 refills | Status: DC

## 2019-05-08 NOTE — Telephone Encounter (Signed)
Noted. Rx for Pepcid 20 mg daily sent to pharmacy.

## 2019-05-08 NOTE — Telephone Encounter (Signed)
Spoke with pt. Pt wants to try going back to the Pepcid. Pt will need an RX since pts pharmacy packages her pills. Pt advised to avoid fried, fatty, greasy, spicy foods and soda. Pt also asked to avoid eating within 3 hours of laying down and in the middle of the night.

## 2019-05-11 NOTE — Telephone Encounter (Signed)
Noted. Pt is aware 

## 2019-05-20 ENCOUNTER — Ambulatory Visit (INDEPENDENT_AMBULATORY_CARE_PROVIDER_SITE_OTHER): Payer: Medicare Other | Admitting: Critical Care Medicine

## 2019-05-20 ENCOUNTER — Encounter: Payer: Self-pay | Admitting: Critical Care Medicine

## 2019-05-20 ENCOUNTER — Institutional Professional Consult (permissible substitution): Payer: Medicare Other | Admitting: Critical Care Medicine

## 2019-05-20 ENCOUNTER — Other Ambulatory Visit: Payer: Self-pay

## 2019-05-20 VITALS — BP 160/66 | HR 59 | Temp 97.4°F | Ht 66.0 in | Wt 196.8 lb

## 2019-05-20 DIAGNOSIS — J438 Other emphysema: Secondary | ICD-10-CM

## 2019-05-20 DIAGNOSIS — I5033 Acute on chronic diastolic (congestive) heart failure: Secondary | ICD-10-CM | POA: Diagnosis not present

## 2019-05-20 DIAGNOSIS — J9621 Acute and chronic respiratory failure with hypoxia: Secondary | ICD-10-CM

## 2019-05-20 DIAGNOSIS — I2722 Pulmonary hypertension due to left heart disease: Secondary | ICD-10-CM | POA: Diagnosis not present

## 2019-05-20 MED ORDER — STIOLTO RESPIMAT 2.5-2.5 MCG/ACT IN AERS: 2.0000 | INHALATION_SPRAY | Freq: Every day | RESPIRATORY_TRACT | 0 refills | Status: DC

## 2019-05-20 MED ORDER — STIOLTO RESPIMAT 2.5-2.5 MCG/ACT IN AERS: 2.0000 | INHALATION_SPRAY | Freq: Every day | RESPIRATORY_TRACT | 11 refills | Status: AC

## 2019-05-20 NOTE — Patient Instructions (Addendum)
Thank you for visiting Dr. Chestine Spore at [facility].  We recommend the following:  Start taking Stiolto inhaler one time daily.  Now you should only need the ipratropium- albuterol inhalers as needed. You can still use these and the Combivent as needed. Keep taking the budesonide nebulizer two times daily.  Meds ordered this encounter  Medications  . Tiotropium Bromide-Olodaterol (STIOLTO RESPIMAT) 2.5-2.5 MCG/ACT AERS    Sig: Inhale 2 puffs into the lungs daily.    Dispense:  4 g    Refill:  11    Return in about 3 months (around 08/20/2019).    Please do your part to reduce the spread of COVID-19.

## 2019-05-20 NOTE — Progress Notes (Signed)
Synopsis: Referred in March 2021 for chronic hypoxia, COPD by Kirstie PeriShah, Ashish, MD  Subjective:   PATIENT ID: Julie Burns Dwight GENDER: female DOB: 1931/01/01, MRN: [id number]  Chief Complaint  Patient presents with  . Consult    sats dropping on 3L continuous oxygen. Gets sob with activity and sats drop with activity.  COPD.  Nebs do not last as long.  Has good days, uses nebulizer twice and some days uses 5 times per day.  History of Covid in November.  Tested at health department.    Ms. Neldon LabellaKallam is an 86 y/o woman presenting for evaluation of COPD and chronic hypoxic respiratory failure. She presents with her caregiver Alona BeneJoyce. She is a former patient of Dr. Juanetta GoslingHawkins and is transferring her care.  She was diagnosed with COPD and started on supplemental oxygen greater than 20 years ago.  Her current COPD regimen is Pulmicort nebs twice daily and duo nebs 4-5 times daily.  She is unsure why she is not on long-acting inhalers.  She does not think she has ever been on them in the past.  Her most significant symptom is shortness of breath, which has been worsening over the past few months.  She has occasional wheezing and infrequent coughing with light-colored sputum, which is her baseline.  She has not been sleeping well recently.  She sleeps in a recliner and has for several years.  She had Covid in November, but had relatively mild symptoms.  Over the past month her symptoms have been significantly worse, associated with worse leg edema, abdominal distention, and decreased saturations.  She presented to her PCPs office saturating 72% on 3 L pulsed despite sitting in a wheelchair.  Over the past few weeks she has had increased doses of diuretics and has lost at least 10 pounds.  Today she got out of the wheelchair long of to be weighed, was 83% liters pulsed oxygen riding in the wheelchair when she got to the room.  To get her saturations above 90% she required 3 L continuous oxygen.   With her worsening  shortness of breath she has been using her duo nebs 4-5 times per day and requiring Combivent 4-5 times per day.  It improves her symptoms when she uses her neb and inhaler.  Her activity has been much more restricted due to her shortness of breath.  She requires assistance and is very symptomatic with dressing.  She has a history of chronic HFpEF, bioprosthetic aortic valve replacement in 2010, A. fib on amiodarone and Eliquis, coronary artery disease status post stents to the RCA, LAD, circumflex in 2015.  She saw Dr. Antoine PocheHochrein on 03/18/2019 and has 1 year plan to follow-up.  Her PCP has been managing her acute heart failure exacerbation recently.  She quit smoking in 1978 after 40 years x 1 pack/day.       Past Medical History:  Diagnosis Date  . Age-related osteoporosis without current pathological fracture   . Anxiety disorder   . Atrial fibrillation (HCC)   . CAD (coronary artery disease)    12/2013-DES to RCA due to NSTEMI- staged PCI to LAD and CX (not ideal for CABG; Echocardiogram 01/16/10 EF 55%, severe LVH; Moderate AS; mild ot moderate TR  . Chronic kidney disease   . Chronic respiratory failure with hypoxia (HCC)   . COPD (chronic obstructive pulmonary disease) (HCC)   . Degenerative myopia with macular hole   . Diabetes (HCC)    TYPE 2  . Hyperlipidemia   .  Hypothyroidism   . Irritable bowel syndrome without diarrhea   . NSTEMI (non-ST elevated myocardial infarction) (HCC)    NSTEMI 3/05; s/p PTCA with stenting of left circumflex coronary artery 3/05; totally occluded small vessel OM  . Occlusion and stenosis of right carotid artery    Carotid Duplex 7/13 moderate disease (<70%) of the right internal carotid arteries, this is a high bifurcation, s/p left CEA without significant stenosis.  . Polyp of colon   . Presence of prosthetic heart valve    S/P aortic valve replacement 04/29/2008   . PVD (peripheral vascular disease) (HCC)   . Sicca syndrome (HCC)   . Stroke (cerebrum)  (HCC)   . Unspecified abdominal hernia without obstruction or gangrene   . Vitamin D deficiency      Family History  Problem Relation Age of Onset  . Hypertension Mother   . CAD Mother   . Hypertension Father   . CAD Father   . Hypertension Sister   . CAD Sister   . Hypertension Brother   . CAD Brother   . Diabetes Brother   . Hypertension Brother   . Diabetes Brother   . COPD Son      Past Surgical History:  Procedure Laterality Date  . ABDOMINAL HERNIA REPAIR     per patient x 3  . AORTIC VALVE REPLACEMENT     S/P aortic valve replacement 04/29/2008   . CARDIAC CATHETERIZATION     by Flonnie Hailstone at [facility]; mLAD 30%, patent LCx stent, mRCA 50%, LVEDP 10, severe AS with AV mean gradient 46 mmHg, Right heart catheterization   . CAROTID ENDARTERECTOMY     Carotid Duplex 7/13 moderate disease (<70%) of the right internal carotid arteries, this is a high bifurcation, s/p left CEA without significant stenosis.  . COLONOSCOPY     per patient- several in the past in [facility], couple of polyps, last TCS between 5-10 years ago.   [name] MASTECTOMY Left 2013   breast cancer    Social History   Socioeconomic History  . Marital status: Divorced    Spouse name: Not on file  . Number of children: 6  . Years of education: Not on file  . Highest education level: GED or equivalent  Occupational History  . Occupation: retired  Tobacco Use  . Smoking status: Former Smoker    Packs/day: 3.00    Types: Cigarettes    Quit date: 10/23/1976    Years since quitting: 42.6  . Smokeless tobacco: Never Used  Substance and Sexual Activity  . Alcohol use: No  . Drug use: No  . Sexual activity: Not Currently  Other Topics Concern  . Not on file  Social History Narrative   Pt lives at home alone but has a caregiver come in 7 days a week and talks with her daughter daily.   Social Determinants of Health   Financial Resource Strain: Low Risk   . Difficulty of Paying Living Expenses: Not hard  at all  Food Insecurity: No Food Insecurity  . Worried About Programme researcher, broadcasting/film/video in the Last Year: Never true  . Ran Out of Food in the Last Year: Never true  Transportation Needs: No Transportation Needs  . Lack of Transportation (Medical): No  . Lack of Transportation (Non-Medical): No  Physical Activity: Inactive  . Days of Exercise per Week: 0 days  . Minutes of Exercise per Session: 0 min  Stress: Stress Concern Present  . Feeling of Stress :  To some extent  Social Connections: Moderately Isolated  . Frequency of Communication with Friends and Family: More than three times a week  . Frequency of Social Gatherings with Friends and Family: Three times a week  . Attends Religious Services: Never  . Active Member of Clubs or Organizations: No  . Attends Banker Meetings: Never  . Marital Status: Divorced  Catering manager Violence: Not At Risk  . Fear of Current or Ex-Partner: No  . Emotionally Abused: No  . Physically Abused: No  . Sexually Abused: No     Allergies  Allergen Reactions  . Aleve [Naproxen Sodium] Other (See Comments)    Makes me fell "drunk-like"  . Bee Venom Swelling  . Codeine Hives  . Ibuprofen Other (See Comments)    "drunk-like" feeling  . Morphine And Related Other (See Comments)    Patient states she "felt weird".  . Norvasc [Amlodipine Besylate] Swelling  . Sulfa Antibiotics   . Tylenol [Acetaminophen] Other (See Comments)    "drunk-like feeling"  . Valium [Diazepam]   . Zetia [Ezetimibe]   . Penicillins Hives and Rash    Has patient had a PCN reaction causing immediate rash, facial/tongue/throat swelling, SOB or lightheadedness with hypotension: yes Has patient had a PCN reaction causing severe rash involving mucus membranes or skin necrosis: Unknown Has patient had a PCN reaction that required hospitalization: No Has patient had a PCN reaction occurring within the last 10 years: No If all of the above answers are "NO", then may  proceed with Cephalosporin use.      Immunization History  Administered Date(s) Administered  . Fluad Quad(high Dose 65+) 11/18/2018  . Influenza Inj Mdck Quad Pf 11/18/2018  . Influenza Split 12/01/2009, 11/18/2011  . Influenza, High Dose Seasonal PF 02/12/2017, 12/12/2017  . Influenza, Seasonal, Injecte, Preservative Fre 10/30/2012, 12/01/2013  . Influenza,inj,Quad PF,6+ Mos 10/30/2012  . Influenza,inj,quad, With Preservative 01/14/2015, 12/30/2015  . Influenza-Unspecified 02/09/2011, 01/14/2015, 12/30/2015, 12/12/2017  . Pneumococcal Conjugate-13 01/14/2015  . Pneumococcal-Unspecified 12/04/1999, 02/19/2006  . Zoster 12/05/2011    Outpatient Medications Prior to Visit  Medication Sig Dispense Refill  . alendronate (FOSAMAX) 70 MG tablet Take 70 mg by mouth once a week. Take with a full glass of water on an empty stomach. Take on Sunday's.    . amiodarone (PACERONE) 200 MG tablet Take 200 mg by mouth daily.    [name] apixaban (ELIQUIS) 5 MG TABS tablet Take 5 mg by mouth 2 (two) times daily.    . benzonatate (TESSALON) 200 MG capsule Take 200 mg by mouth 3 (three) times daily as needed for cough.    . budesonide (PULMICORT) 0.5 MG/2ML nebulizer solution Take 2 mLs (0.5 mg total) by nebulization 2 (two) times daily. 120 mL 5  . Cholecalciferol (VITAMIN D3) 5000 units CAPS Take 1 capsule by mouth daily.    . famotidine (PEPCID) 20 MG tablet Take 1 tablet (20 mg total) by mouth daily. 30 tablet 3  . furosemide (LASIX) 40 MG tablet Take 1 tablet (40 mg total) by mouth 2 (two) times daily. (Patient taking differently: Take 160 mg by mouth 2 (two) times daily. 2 tabs in am and 2 in pm) 30 tablet 1  . Ipratropium-Albuterol (COMBIVENT RESPIMAT) 20-100 MCG/ACT AERS respimat Inhale 1-2 puffs into the lungs every 4 (four) hours as needed for wheezing or shortness of breath.     [name] ipratropium-albuterol (DUONEB) 0.5-2.5 (3) MG/3ML SOLN Take 3 mLs by nebulization every 6 (six) hours as needed.    [name]  isosorbide mononitrate (IMDUR) 60 MG 24 hr tablet Take 1 tablet (60 mg total) by mouth daily. 90 tablet 3  . levothyroxine (SYNTHROID, LEVOTHROID) 75 MCG tablet 75 mcg. Take one tablet daily by mouth except Mon. And Thurs. Take 1 1/2 tablets    . lisinopril (PRINIVIL,ZESTRIL) 20 MG tablet Take 20 mg by mouth daily.    [name] LORazepam (ATIVAN) 0.5 MG tablet Take 0.5 mg by mouth 2 (two) times daily as needed for anxiety or sleep.     . metoprolol succinate (TOPROL-XL) 100 MG 24 hr tablet Take 50 mg by mouth daily. Takes 50 mg tablet daily. If blood pressure is elevated take additional 50 mg tablet.    . Misc. Devices MISC underpads due to urinary incontinence    . Multiple Vitamins-Minerals (ICAPS AREDS FORMULA PO) Take 1 capsule by mouth 2 (two) times daily.     . nitroGLYCERIN (NITROSTAT) 0.4 MG SL tablet PLACE 1 TABLET UNDER THE TONGUE AT ONSET OF CHEST PAIN EVERY 5 MINTUES UP TO 3 TIMES AS NEEDED 25 tablet 3  . OXYGEN Inhale into the lungs.    . polyethylene glycol (MIRALAX / GLYCOLAX) packet Take 17 g by mouth daily.    . traMADol (ULTRAM) 50 MG tablet Take 50 mg by mouth every 6 (six) hours as needed for moderate pain.     [name] nystatin-triamcinolone ointment (MYCOLOG) Apply 1 application topically 2 (two) times daily as needed (rash).     . ondansetron (ZOFRAN) 4 MG tablet Take 4 mg by mouth every 8 (eight) hours as needed for nausea or vomiting.     No facility-administered medications prior to visit.    Review of Systems  Constitutional: Negative.   HENT: Negative for nosebleeds.   Eyes:       Chronic vision loss  Respiratory: Positive for cough, sputum production, shortness of breath and wheezing.   Cardiovascular: Positive for leg swelling.       Abdominal distention  Neurological: Positive for weakness.  Endo/Heme/Allergies: Negative for environmental allergies.     Objective:   Vitals:   05/20/19 1031  BP: (!) 160/66  Pulse: (!) 59  Temp: (!) 97.4 F (36.3 C)  TempSrc:  Temporal  SpO2: 94%  Weight: 196 lb 12.8 oz (89.3 kg)  Height: 5\' 6"  (1.676 m)   94% on 3 LPM continuous BMI Readings from Last 3 Encounters:  05/20/19 31.76 kg/m  04/08/19 32.02 kg/m  09/11/18 31.80 kg/m   Wt Readings from Last 3 Encounters:  05/20/19 196 lb 12.8 oz (89.3 kg)  04/08/19 201 lb 6.4 oz (91.4 kg)  09/11/18 200 lb (90.7 kg)    Physical Exam Vitals reviewed.  Constitutional:      Appearance: Normal appearance. She is not ill-appearing.     Comments: Frail-appearing elderly woman sitting in a wheelchair  HENT:     Head: Normocephalic and atraumatic.  Eyes:     General: No scleral icterus. Cardiovascular:     Rate and Rhythm: Normal rate and regular rhythm.     Comments: JVD to angle of mandible Pulmonary:     Comments: Reading comfortably on 3 L nasal cannula.  No conversational dyspnea.  Basilar rhales.  No wheezing. Abdominal:     General: There is no distension.     Palpations: Abdomen is soft.     Tenderness: There is no abdominal tenderness.  Musculoskeletal:        General: No deformity.     Cervical back: Neck supple.  Comments: Bilateral pitting edema to her knees.  Compression stockings in place.  Lymphadenopathy:     Cervical: No cervical adenopathy.  Skin:    General: Skin is warm and dry.     Findings: No rash.  Neurological:     General: No focal deficit present.     Mental Status: She is alert.     Coordination: Coordination normal.  Psychiatric:        Mood and Affect: Mood normal.        Behavior: Behavior normal.      CBC    Component Value Date/Time   WBC 13.0 (Burns) 05/21/2017 0449   RBC 4.13 05/21/2017 0449   HGB 11.1 (L) 05/21/2017 0449   HCT 37.7 05/21/2017 0449   PLT 316 05/21/2017 0449   MCV 91.3 05/21/2017 0449   MCH 26.9 05/21/2017 0449   MCHC 29.4 (L) 05/21/2017 0449   RDW 14.8 05/21/2017 0449   LYMPHSABS 0.9 05/15/2017 1556   MONOABS 0.7 05/15/2017 1556   EOSABS 0.1 05/15/2017 1556   BASOSABS 0.0  05/15/2017 1556    CHEMISTRY No results for input(s): NA, K, CL, CO2, GLUCOSE, BUN, CREATININE, CALCIUM, MG, PHOS in the last 168 hours. CrCl cannot be calculated (Patient's most recent lab result is older than the maximum 21 days allowed.).   Chest Imaging- films reviewed: CXR, 2 view 05/15/2017-slightly rotated, R>L dependent pleural effusions with increased interstitial markings diffusely bilaterally.  Pulmonary Functions Testing Results: No flowsheet data found.   Echocardiogram 04/10/2019: LVEF 60 to 65%, moderate LVH.  Normal wall motion.  Grade 2 diastolic dysfunction and elevated LVEDP.  Severely dilated LA, normal RV size and function with mildly increased wall thickness.  Moderately dilated RA.  Severe pulmonary hypertension.  Moderate mitral annular calcification with mild MS, moderate TR.  No evidence of aortic regurgitation or stenosis.  Aortic root dilation.     Assessment & Plan:     ICD-10-CM   1. Other emphysema (HCC)  J43.8   2. Pulmonary hypertension due to left heart disease (HCC)  I27.22   3. Acute on chronic heart failure with preserved ejection fraction (HFpEF) (HCC)  I50.33   4. Acute on chronic respiratory failure with hypoxia (HCC)  J96.21     Acute on chronic hypoxic respiratory failure likely due to decompensated heart failure, possibly due to COPD. -Walked in the office today on 3 L continuous--> only dropped to 89%.  She should continue this with activity at home and sleep. -Continue Pulmicort nebs twice daily -Adding Stiolto once daily.  Sample inhaler provided with training.  She should no longer require duo nebs scheduled throughout the day.  She should continue to use these only as needed. -Continue Combivent rescue inhaler as needed  Acute on chronic HFpEF with acute pulmonary edema -Continue diuresis as prescribed by PCP.  I recommended that she notify Dr. Antoine Poche at some point of what has been going on, but it seems that Dr. Sherryll Burger is managing this  adequately as her edema has improved and she is losing weight.  RTC in 3 months.      Current Outpatient Medications:  .  alendronate (FOSAMAX) 70 MG tablet, Take 70 mg by mouth once a week. Take with a full glass of water on an empty stomach. Take on Sunday's., Disp: , Rfl:  .  amiodarone (PACERONE) 200 MG tablet, Take 200 mg by mouth daily., Disp: , Rfl:  .  apixaban (ELIQUIS) 5 MG TABS tablet, Take 5 mg by mouth  2 (two) times daily., Disp: , Rfl:  .  benzonatate (TESSALON) 200 MG capsule, Take 200 mg by mouth 3 (three) times daily as needed for cough., Disp: , Rfl:  .  budesonide (PULMICORT) 0.5 MG/2ML nebulizer solution, Take 2 mLs (0.5 mg total) by nebulization 2 (two) times daily., Disp: 120 mL, Rfl: 5 .  Cholecalciferol (VITAMIN D3) 5000 units CAPS, Take 1 capsule by mouth daily., Disp: , Rfl:  .  famotidine (PEPCID) 20 MG tablet, Take 1 tablet (20 mg total) by mouth daily., Disp: 30 tablet, Rfl: 3 .  furosemide (LASIX) 40 MG tablet, Take 1 tablet (40 mg total) by mouth 2 (two) times daily. (Patient taking differently: Take 160 mg by mouth 2 (two) times daily. 2 tabs in am and 2 in pm), Disp: 30 tablet, Rfl: 1 .  Ipratropium-Albuterol (COMBIVENT RESPIMAT) 20-100 MCG/ACT AERS respimat, Inhale 1-2 puffs into the lungs every 4 (four) hours as needed for wheezing or shortness of breath. , Disp: , Rfl:  .  ipratropium-albuterol (DUONEB) 0.5-2.5 (3) MG/3ML SOLN, Take 3 mLs by nebulization every 6 (six) hours as needed., Disp: , Rfl:  .  isosorbide mononitrate (IMDUR) 60 MG 24 hr tablet, Take 1 tablet (60 mg total) by mouth daily., Disp: 90 tablet, Rfl: 3 .  levothyroxine (SYNTHROID, LEVOTHROID) 75 MCG tablet, 75 mcg. Take one tablet daily by mouth except Mon. And Thurs. Take 1 1/2 tablets, Disp: , Rfl:  .  lisinopril (PRINIVIL,ZESTRIL) 20 MG tablet, Take 20 mg by mouth daily., Disp: , Rfl:  .  LORazepam (ATIVAN) 0.5 MG tablet, Take 0.5 mg by mouth 2 (two) times daily as needed for anxiety or  sleep. , Disp: , Rfl:  .  metoprolol succinate (TOPROL-XL) 100 MG 24 hr tablet, Take 50 mg by mouth daily. Takes 50 mg tablet daily. If blood pressure is elevated take additional 50 mg tablet., Disp: , Rfl:  .  Misc. Devices MISC, underpads due to urinary incontinence, Disp: , Rfl:  .  Multiple Vitamins-Minerals (ICAPS AREDS FORMULA PO), Take 1 capsule by mouth 2 (two) times daily. , Disp: , Rfl:  .  nitroGLYCERIN (NITROSTAT) 0.4 MG SL tablet, PLACE 1 TABLET UNDER THE TONGUE AT ONSET OF CHEST PAIN EVERY 5 MINTUES UP TO 3 TIMES AS NEEDED, Disp: 25 tablet, Rfl: 3 .  OXYGEN, Inhale into the lungs., Disp: , Rfl:  .  polyethylene glycol (MIRALAX / GLYCOLAX) packet, Take 17 g by mouth daily., Disp: , Rfl:  .  traMADol (ULTRAM) 50 MG tablet, Take 50 mg by mouth every 6 (six) hours as needed for moderate pain. , Disp: , Rfl:  .  nystatin-triamcinolone ointment (MYCOLOG), Apply 1 application topically 2 (two) times daily as needed (rash). , Disp: , Rfl:  .  ondansetron (ZOFRAN) 4 MG tablet, Take 4 mg by mouth every 8 (eight) hours as needed for nausea or vomiting., Disp: , Rfl:  .  Tiotropium Bromide-Olodaterol (STIOLTO RESPIMAT) 2.5-2.5 MCG/ACT AERS, Inhale 2 puffs into the lungs daily., Disp: 4 g, Rfl: 11 .  Tiotropium Bromide-Olodaterol (STIOLTO RESPIMAT) 2.5-2.5 MCG/ACT AERS, Inhale 2 puffs into the lungs daily., Disp: 4 g, Rfl: 0     Steffanie Dunn, DO [facility] 05/20/2019 12:40 PM

## 2019-05-25 ENCOUNTER — Telehealth: Payer: Self-pay | Admitting: Critical Care Medicine

## 2019-05-25 NOTE — Telephone Encounter (Signed)
Called and spoke with pt's caregiver Julie Burns and provided her with instructions regarding Stiolto inhaler. Nothing further needed.

## 2019-05-27 ENCOUNTER — Telehealth: Payer: Self-pay | Admitting: Critical Care Medicine

## 2019-05-27 NOTE — Telephone Encounter (Signed)
ATC x 2, line would ring then change to busy signal. WCB.

## 2019-05-28 NOTE — Telephone Encounter (Signed)
ATC but the line would ring then go to a busy signal. Will try back.

## 2019-05-29 ENCOUNTER — Telehealth: Payer: Self-pay | Admitting: Critical Care Medicine

## 2019-05-29 DIAGNOSIS — J9611 Chronic respiratory failure with hypoxia: Secondary | ICD-10-CM

## 2019-05-29 NOTE — Telephone Encounter (Signed)
Yes please! Thanks Verlon Au!  LPC

## 2019-05-29 NOTE — Telephone Encounter (Signed)
ATC but the line would ring then go to a busy signal. We have attempted to contact Alona Bene several times with no success or call back from her. Per triage protocol, message will be closed.

## 2019-05-29 NOTE — Telephone Encounter (Signed)
o2 order sent.

## 2019-05-29 NOTE — Telephone Encounter (Signed)
Spoke with the pt's caretaker, Ander Slade She states that pt's insurance changed and she is needing new order send to DME to change from [facility] to [facility] for her o2  She needs new o2 order  Please advise if okay to send- I think according to her notes she is on 3lpm 24/7 but just want to be sure, thanks

## 2019-06-01 ENCOUNTER — Telehealth: Payer: Self-pay | Admitting: Critical Care Medicine

## 2019-06-01 NOTE — Telephone Encounter (Signed)
I called and spoke with Julie Burns and made her aware that we were awaiting the order to be signed by the doctor. It was signed and a message has been sent to [facility]. I advised her that they should be reaching out to her this week and if she has not heard anything by Wednesday to call back so that she is not without during the Holiday.

## 2019-06-01 NOTE — Telephone Encounter (Signed)
Spoke with Marchelle Folks  By the time I had called her, she was checking pt's insurance and realized that pt can not switch to them and has to stay with [facility] for 2 more years due to insurance payment cap  She had pt on the other line and stated would inform her

## 2019-06-01 NOTE — Telephone Encounter (Signed)
Amanda-[facility] calling back order is not placed correctly for the oxygen and the concentrator and there is no testing that qualifies pt for oxygen  [phone]

## 2019-06-02 ENCOUNTER — Telehealth: Payer: Self-pay

## 2019-06-02 NOTE — Telephone Encounter (Signed)
I have not prescribed this medication. This is typically prescribed by cardiology.

## 2019-06-02 NOTE — Telephone Encounter (Signed)
KH, I received a refill request from [facility] for Amiodarone HCL 200 mg. I don't see that you prescribed this. Please advise.

## 2019-06-03 ENCOUNTER — Emergency Department (HOSPITAL_COMMUNITY): Payer: Medicare Other

## 2019-06-03 ENCOUNTER — Other Ambulatory Visit: Payer: Self-pay

## 2019-06-03 ENCOUNTER — Inpatient Hospital Stay (HOSPITAL_COMMUNITY)
Admission: EM | Admit: 2019-06-03 | Discharge: 2019-06-11 | DRG: 291 | Disposition: A | Discharge status: Home Health | Payer: Medicare Other | Attending: Family Medicine | Admitting: Family Medicine

## 2019-06-03 ENCOUNTER — Telehealth: Payer: Self-pay | Admitting: Critical Care Medicine

## 2019-06-03 ENCOUNTER — Encounter (HOSPITAL_COMMUNITY): Payer: Self-pay | Admitting: Emergency Medicine

## 2019-06-03 DIAGNOSIS — R0600 Dyspnea, unspecified: Secondary | ICD-10-CM

## 2019-06-03 DIAGNOSIS — E039 Hypothyroidism, unspecified: Secondary | ICD-10-CM | POA: Diagnosis present

## 2019-06-03 DIAGNOSIS — I272 Pulmonary hypertension, unspecified: Secondary | ICD-10-CM | POA: Diagnosis not present

## 2019-06-03 DIAGNOSIS — N1832 Chronic kidney disease, stage 3b: Secondary | ICD-10-CM | POA: Diagnosis present

## 2019-06-03 DIAGNOSIS — Z853 Personal history of malignant neoplasm of breast: Secondary | ICD-10-CM | POA: Diagnosis not present

## 2019-06-03 DIAGNOSIS — Z951 Presence of aortocoronary bypass graft: Secondary | ICD-10-CM | POA: Diagnosis not present

## 2019-06-03 DIAGNOSIS — I252 Old myocardial infarction: Secondary | ICD-10-CM

## 2019-06-03 DIAGNOSIS — Z9012 Acquired absence of left breast and nipple: Secondary | ICD-10-CM

## 2019-06-03 DIAGNOSIS — I130 Hypertensive heart and chronic kidney disease with heart failure and stage 1 through stage 4 chronic kidney disease, or unspecified chronic kidney disease: Secondary | ICD-10-CM | POA: Diagnosis not present

## 2019-06-03 DIAGNOSIS — Z8616 Personal history of COVID-19: Secondary | ICD-10-CM | POA: Diagnosis not present

## 2019-06-03 DIAGNOSIS — J441 Chronic obstructive pulmonary disease with (acute) exacerbation: Secondary | ICD-10-CM | POA: Diagnosis not present

## 2019-06-03 DIAGNOSIS — Z952 Presence of prosthetic heart valve: Secondary | ICD-10-CM

## 2019-06-03 DIAGNOSIS — K5900 Constipation, unspecified: Secondary | ICD-10-CM | POA: Diagnosis present

## 2019-06-03 DIAGNOSIS — I509 Heart failure, unspecified: Secondary | ICD-10-CM

## 2019-06-03 DIAGNOSIS — Z833 Family history of diabetes mellitus: Secondary | ICD-10-CM

## 2019-06-03 DIAGNOSIS — J9611 Chronic respiratory failure with hypoxia: Secondary | ICD-10-CM

## 2019-06-03 DIAGNOSIS — I739 Peripheral vascular disease, unspecified: Secondary | ICD-10-CM | POA: Diagnosis not present

## 2019-06-03 DIAGNOSIS — Z87891 Personal history of nicotine dependence: Secondary | ICD-10-CM

## 2019-06-03 DIAGNOSIS — J9621 Acute and chronic respiratory failure with hypoxia: Secondary | ICD-10-CM | POA: Diagnosis not present

## 2019-06-03 DIAGNOSIS — G894 Chronic pain syndrome: Secondary | ICD-10-CM | POA: Diagnosis not present

## 2019-06-03 DIAGNOSIS — Z8673 Personal history of transient ischemic attack (TIA), and cerebral infarction without residual deficits: Secondary | ICD-10-CM

## 2019-06-03 DIAGNOSIS — R0602 Shortness of breath: Secondary | ICD-10-CM | POA: Diagnosis not present

## 2019-06-03 DIAGNOSIS — I2510 Atherosclerotic heart disease of native coronary artery without angina pectoris: Secondary | ICD-10-CM | POA: Diagnosis not present

## 2019-06-03 DIAGNOSIS — Z66 Do not resuscitate: Secondary | ICD-10-CM | POA: Diagnosis not present

## 2019-06-03 DIAGNOSIS — Z9981 Dependence on supplemental oxygen: Secondary | ICD-10-CM | POA: Diagnosis not present

## 2019-06-03 DIAGNOSIS — Z7951 Long term (current) use of inhaled steroids: Secondary | ICD-10-CM

## 2019-06-03 DIAGNOSIS — I25118 Atherosclerotic heart disease of native coronary artery with other forms of angina pectoris: Secondary | ICD-10-CM | POA: Diagnosis not present

## 2019-06-03 DIAGNOSIS — Z79899 Other long term (current) drug therapy: Secondary | ICD-10-CM | POA: Diagnosis not present

## 2019-06-03 DIAGNOSIS — F419 Anxiety disorder, unspecified: Secondary | ICD-10-CM | POA: Diagnosis present

## 2019-06-03 DIAGNOSIS — J449 Chronic obstructive pulmonary disease, unspecified: Secondary | ICD-10-CM

## 2019-06-03 DIAGNOSIS — N179 Acute kidney failure, unspecified: Secondary | ICD-10-CM | POA: Diagnosis present

## 2019-06-03 DIAGNOSIS — Z79890 Hormone replacement therapy (postmenopausal): Secondary | ICD-10-CM

## 2019-06-03 DIAGNOSIS — E1122 Type 2 diabetes mellitus with diabetic chronic kidney disease: Secondary | ICD-10-CM | POA: Diagnosis not present

## 2019-06-03 DIAGNOSIS — J9622 Acute and chronic respiratory failure with hypercapnia: Secondary | ICD-10-CM | POA: Diagnosis present

## 2019-06-03 DIAGNOSIS — I4891 Unspecified atrial fibrillation: Secondary | ICD-10-CM

## 2019-06-03 DIAGNOSIS — E1151 Type 2 diabetes mellitus with diabetic peripheral angiopathy without gangrene: Secondary | ICD-10-CM | POA: Diagnosis present

## 2019-06-03 DIAGNOSIS — M810 Age-related osteoporosis without current pathological fracture: Secondary | ICD-10-CM | POA: Diagnosis present

## 2019-06-03 DIAGNOSIS — Z7901 Long term (current) use of anticoagulants: Secondary | ICD-10-CM

## 2019-06-03 DIAGNOSIS — Z955 Presence of coronary angioplasty implant and graft: Secondary | ICD-10-CM

## 2019-06-03 DIAGNOSIS — E785 Hyperlipidemia, unspecified: Secondary | ICD-10-CM | POA: Diagnosis present

## 2019-06-03 DIAGNOSIS — I10 Essential (primary) hypertension: Secondary | ICD-10-CM | POA: Diagnosis not present

## 2019-06-03 DIAGNOSIS — I480 Paroxysmal atrial fibrillation: Secondary | ICD-10-CM | POA: Diagnosis present

## 2019-06-03 DIAGNOSIS — I5033 Acute on chronic diastolic (congestive) heart failure: Secondary | ICD-10-CM | POA: Diagnosis not present

## 2019-06-03 DIAGNOSIS — H548 Legal blindness, as defined in USA: Secondary | ICD-10-CM | POA: Diagnosis present

## 2019-06-03 LAB — BASIC METABOLIC PANEL WITH GFR
Anion gap: 13 (ref 5–15)
BUN: 41 mg/dL — ABNORMAL HIGH (ref 8–23)
CO2: 30 mmol/L (ref 22–32)
Calcium: 8.9 mg/dL (ref 8.9–10.3)
Chloride: 93 mmol/L — ABNORMAL LOW (ref 98–111)
Creatinine, Ser: 1.6 mg/dL — ABNORMAL HIGH (ref 0.44–1.00)
GFR calc Af Amer: 33 mL/min — ABNORMAL LOW
GFR calc non Af Amer: 29 mL/min — ABNORMAL LOW
Glucose, Bld: 133 mg/dL — ABNORMAL HIGH (ref 70–99)
Potassium: 4.1 mmol/L (ref 3.5–5.1)
Sodium: 136 mmol/L (ref 135–145)

## 2019-06-03 LAB — CBC
HCT: 43.4 % (ref 36.0–46.0)
Hemoglobin: 13.7 g/dL (ref 12.0–15.0)
MCH: 29.3 pg (ref 26.0–34.0)
MCHC: 31.6 g/dL (ref 30.0–36.0)
MCV: 92.9 fL (ref 80.0–100.0)
Platelets: 234 10*3/uL (ref 150–400)
RBC: 4.67 MIL/uL (ref 3.87–5.11)
RDW: 14.7 % (ref 11.5–15.5)
WBC: 8 10*3/uL (ref 4.0–10.5)
nRBC: 0 % (ref 0.0–0.2)

## 2019-06-03 LAB — PROTIME-INR
INR: 1.2 (ref 0.8–1.2)
Prothrombin Time: 14.9 seconds (ref 11.4–15.2)

## 2019-06-03 LAB — TROPONIN I (HIGH SENSITIVITY)
Troponin I (High Sensitivity): 27 ng/L — ABNORMAL HIGH (ref <18)
Troponin I (High Sensitivity): 28 ng/L — ABNORMAL HIGH (ref <18)

## 2019-06-03 LAB — RESPIRATORY PANEL BY RT PCR (FLU A&B, COVID)
Influenza A by PCR: NEGATIVE
Influenza B by PCR: NEGATIVE
SARS Coronavirus 2 by RT PCR: NEGATIVE

## 2019-06-03 LAB — SARS CORONAVIRUS 2 (TAT 6-24 HRS): SARS Coronavirus 2: NEGATIVE

## 2019-06-03 LAB — MAGNESIUM: Magnesium: 2.2 mg/dL (ref 1.7–2.4)

## 2019-06-03 LAB — BRAIN NATRIURETIC PEPTIDE: B Natriuretic Peptide: 1086 pg/mL — ABNORMAL HIGH (ref 0.0–100.0)

## 2019-06-03 LAB — TSH: TSH: 1.948 u[IU]/mL (ref 0.350–4.500)

## 2019-06-03 MED ORDER — AMIODARONE HCL 200 MG PO TABS
200.0000 mg | ORAL_TABLET | Freq: Every day | ORAL | Status: DC
  Administered 2019-06-03: 18:00:00 200 mg via ORAL
  Filled 2019-06-03: qty 1

## 2019-06-03 MED ORDER — LEVALBUTEROL HCL 0.63 MG/3ML IN NEBU
0.6300 mg | INHALATION_SOLUTION | Freq: Four times a day (QID) | RESPIRATORY_TRACT | Status: DC | PRN
  Administered 2019-06-03 – 2019-06-06 (×8): 0.63 mg via RESPIRATORY_TRACT
  Filled 2019-06-03 (×8): qty 3

## 2019-06-03 MED ORDER — APIXABAN 5 MG PO TABS: 5.0000 mg | ORAL_TABLET | Freq: Two times a day (BID) | ORAL | Status: DC

## 2019-06-03 MED ORDER — LEVOTHYROXINE SODIUM 50 MCG PO TABS: 75.0000 ug | ORAL_TABLET | Freq: Every day | ORAL | Status: DC

## 2019-06-03 MED ORDER — BENZONATATE 100 MG PO CAPS: 200.0000 mg | ORAL_CAPSULE | Freq: Three times a day (TID) | ORAL | Status: DC | PRN

## 2019-06-03 MED ORDER — LEVOTHYROXINE SODIUM 75 MCG PO TABS
75.0000 ug | ORAL_TABLET | ORAL | Status: DC
  Administered 2019-06-05 – 2019-06-10 (×5): 75 ug via ORAL
  Filled 2019-06-03 (×5): qty 1

## 2019-06-03 MED ORDER — OCUVITE-LUTEIN PO CAPS
1.0000 | ORAL_CAPSULE | Freq: Two times a day (BID) | ORAL | Status: DC
  Administered 2019-06-03 – 2019-06-11 (×16): 1 via ORAL
  Filled 2019-06-03 (×16): qty 1

## 2019-06-03 MED ORDER — MELATONIN 5 MG PO TABS
5.0000 mg | ORAL_TABLET | Freq: Every evening | ORAL | Status: DC | PRN
  Filled 2019-06-03: qty 1

## 2019-06-03 MED ORDER — BUDESONIDE 0.5 MG/2ML IN SUSP
0.5000 mg | Freq: Two times a day (BID) | RESPIRATORY_TRACT | Status: DC
  Administered 2019-06-03 – 2019-06-11 (×15): 0.5 mg via RESPIRATORY_TRACT
  Filled 2019-06-03 (×15): qty 2

## 2019-06-03 MED ORDER — APIXABAN 2.5 MG PO TABS
2.5000 mg | ORAL_TABLET | Freq: Two times a day (BID) | ORAL | Status: DC
  Administered 2019-06-03 – 2019-06-04 (×3): 2.5 mg via ORAL
  Filled 2019-06-03 (×7): qty 1

## 2019-06-03 MED ORDER — ARFORMOTEROL TARTRATE 15 MCG/2ML IN NEBU
15.0000 ug | INHALATION_SOLUTION | Freq: Two times a day (BID) | RESPIRATORY_TRACT | Status: DC
  Administered 2019-06-03 – 2019-06-11 (×16): 15 ug via RESPIRATORY_TRACT
  Filled 2019-06-03 (×16): qty 2

## 2019-06-03 MED ORDER — FUROSEMIDE 10 MG/ML IJ SOLN
40.0000 mg | Freq: Once | INTRAMUSCULAR | Status: AC
  Administered 2019-06-03: 14:00:00 40 mg via INTRAVENOUS
  Filled 2019-06-03: qty 4

## 2019-06-03 MED ORDER — SODIUM CHLORIDE 0.9% FLUSH: 3.0000 mL | INTRAVENOUS | Status: DC | PRN

## 2019-06-03 MED ORDER — MELATONIN 3 MG PO TABS
6.0000 mg | ORAL_TABLET | Freq: Every evening | ORAL | Status: DC | PRN
  Administered 2019-06-03 – 2019-06-09 (×7): 6 mg via ORAL
  Filled 2019-06-03 (×7): qty 2

## 2019-06-03 MED ORDER — ONDANSETRON HCL 4 MG/2ML IJ SOLN
4.0000 mg | Freq: Four times a day (QID) | INTRAMUSCULAR | Status: DC | PRN
  Administered 2019-06-04 – 2019-06-10 (×5): 4 mg via INTRAVENOUS
  Filled 2019-06-03 (×6): qty 2

## 2019-06-03 MED ORDER — FUROSEMIDE 10 MG/ML IJ SOLN
40.0000 mg | Freq: Two times a day (BID) | INTRAMUSCULAR | Status: DC
  Administered 2019-06-03 – 2019-06-05 (×4): 40 mg via INTRAVENOUS
  Filled 2019-06-03 (×4): qty 4

## 2019-06-03 MED ORDER — ONDANSETRON HCL 4 MG/2ML IJ SOLN
4.0000 mg | Freq: Once | INTRAMUSCULAR | Status: AC
  Administered 2019-06-03: 12:00:00 4 mg via INTRAVENOUS
  Filled 2019-06-03: qty 2

## 2019-06-03 MED ORDER — SODIUM CHLORIDE 0.9% FLUSH
3.0000 mL | Freq: Two times a day (BID) | INTRAVENOUS | Status: DC
  Administered 2019-06-03 – 2019-06-10 (×15): 3 mL via INTRAVENOUS

## 2019-06-03 MED ORDER — POLYETHYLENE GLYCOL 3350 17 G PO PACK
17.0000 g | PACK | Freq: Every day | ORAL | Status: DC
  Administered 2019-06-04 – 2019-06-10 (×5): 17 g via ORAL
  Filled 2019-06-03 (×8): qty 1

## 2019-06-03 MED ORDER — UMECLIDINIUM BROMIDE 62.5 MCG/INH IN AEPB
1.0000 | INHALATION_SPRAY | Freq: Every day | RESPIRATORY_TRACT | Status: DC
  Administered 2019-06-04 – 2019-06-11 (×8): 1 via RESPIRATORY_TRACT
  Filled 2019-06-03 (×2): qty 7

## 2019-06-03 MED ORDER — LEVOTHYROXINE SODIUM 75 MCG PO TABS
112.5000 ug | ORAL_TABLET | ORAL | Status: DC
  Administered 2019-06-04 – 2019-06-11 (×3): 112.5 ug via ORAL
  Filled 2019-06-03 (×3): qty 2
  Filled 2019-06-03: qty 1

## 2019-06-03 MED ORDER — ONDANSETRON HCL 4 MG PO TABS
4.0000 mg | ORAL_TABLET | Freq: Three times a day (TID) | ORAL | Status: DC | PRN
  Administered 2019-06-04 – 2019-06-10 (×4): 4 mg via ORAL
  Filled 2019-06-03 (×5): qty 1

## 2019-06-03 MED ORDER — TRAMADOL HCL 50 MG PO TABS
50.0000 mg | ORAL_TABLET | Freq: Three times a day (TID) | ORAL | Status: DC | PRN
  Administered 2019-06-03 – 2019-06-05 (×3): 50 mg via ORAL
  Filled 2019-06-03 (×3): qty 1

## 2019-06-03 MED ORDER — VITAMIN D 25 MCG (1000 UNIT) PO TABS
2000.0000 [IU] | ORAL_TABLET | Freq: Every day | ORAL | Status: DC
  Administered 2019-06-03 – 2019-06-11 (×8): 2000 [IU] via ORAL
  Filled 2019-06-03 (×8): qty 2

## 2019-06-03 MED ORDER — AMIODARONE HCL 150 MG/3ML IV SOLN
150.0000 mg | Freq: Once | INTRAVENOUS | Status: AC
  Administered 2019-06-03: 14:00:00 150 mg via INTRAVENOUS
  Filled 2019-06-03: qty 3

## 2019-06-03 MED ORDER — ORAL CARE MOUTH RINSE
15.0000 mL | Freq: Two times a day (BID) | OROMUCOSAL | Status: DC
  Administered 2019-06-03 – 2019-06-08 (×11): 15 mL via OROMUCOSAL

## 2019-06-03 MED ORDER — METOPROLOL TARTRATE 5 MG/5ML IV SOLN
5.0000 mg | Freq: Once | INTRAVENOUS | Status: AC
  Administered 2019-06-03: 14:00:00 5 mg via INTRAVENOUS
  Filled 2019-06-03: qty 5

## 2019-06-03 MED ORDER — NITROGLYCERIN 0.4 MG SL SUBL: 0.4000 mg | SUBLINGUAL_TABLET | SUBLINGUAL | Status: DC | PRN

## 2019-06-03 MED ORDER — METOPROLOL SUCCINATE ER 50 MG PO TB24
50.0000 mg | ORAL_TABLET | Freq: Two times a day (BID) | ORAL | Status: DC
  Administered 2019-06-03 – 2019-06-10 (×16): 50 mg via ORAL
  Filled 2019-06-03 (×5): qty 2
  Filled 2019-06-03 (×2): qty 1
  Filled 2019-06-03: qty 2
  Filled 2019-06-03 (×3): qty 1
  Filled 2019-06-03: qty 2
  Filled 2019-06-03: qty 1
  Filled 2019-06-03 (×4): qty 2

## 2019-06-03 MED ORDER — METOPROLOL TARTRATE 5 MG/5ML IV SOLN
5.0000 mg | Freq: Once | INTRAVENOUS | Status: AC
  Administered 2019-06-03: 21:00:00 5 mg via INTRAVENOUS
  Filled 2019-06-03: qty 5

## 2019-06-03 MED ORDER — ACETAMINOPHEN 325 MG PO TABS: 650.0000 mg | ORAL_TABLET | ORAL | Status: DC | PRN

## 2019-06-03 MED ORDER — SODIUM CHLORIDE 0.9 % IV SOLN: 250.0000 mL | INTRAVENOUS | Status: DC | PRN

## 2019-06-03 NOTE — TOC Initial Note (Signed)
Transition of Care [facility]) - Initial/Assessment Note    Patient Details  Name: Julie Burns MRN: [id number] Date of Birth: [birth year]  Transition of Care [facility]) CM/SW Contact:    Jamus Loving Dimitri Ped, LCSW Phone Number: 06/03/2019, 5:00 PM  Clinical Narrative:     Patient is an 86 yo female who was brought into the ED with shortness of breath. CSW in contact with patients daughter Angelita Ingles to conduct TOC assessment. Patient resides at home alone but receives assistance from a medicaid funded caregiver 6 days weekly by the name of [name]. [name] is from the Aging, Disability Transit service of [place]. Patient was receiving her 02 needs from [place] but is not looking to switch to [place].   CSW in contact with Estill Bamberg from [facility]: [phone]. Estill Bamberg explains that patient is required to have an inpatient stay to qualify for concentrator tank and portable concentrator tank. Patient can not be in ED setting or under observation status. Estill Bamberg goes into detail and explains that patients 02 stats must be taken at least two days prior to discharge.   Patient is able to ambulate independently with the use of Rollator. Patient receives assistance with bathing, companionship, and household chores.   TOC team will continue to follow patient for discharge related needs.  [facility]  Clinical Social Worker  Ph: [phone]                Expected Discharge Plan: [place] Services Barriers to Discharge: Continued Medical Work up   Patient Goals and CMS Choice Patient states their goals for this hospitalization and ongoing recovery are:: family expresses that their goal is for patient to get well before discharging home CMS Medicare.gov Compare Post Acute Care list provided to:: Patient Represenative (must comment)([place]) Choice offered to / list presented to : Adult Children  Expected Discharge Plan and Services Expected Discharge Plan:  [place] In-house Referral: Clinical Social Work Discharge Planning Services: CM Consult Post Acute Care Choice: [place] arrangements for the past 2 months: [place]                   DME Agency: [employer] Date DME Agency Contacted: 06/03/19 Time DME Agency Contacted: [phone] Representative spoke with at DME Agency: Estill Bamberg            Prior Living Arrangements/Services Living arrangements for the past 2 months: [place] Lives with:: Self Patient language and need for interpreter reviewed:: Yes Do you feel safe going back to the place where you live?: Yes      Need for Family Participation in Patient Care: Yes (Comment) Care giver support system in place?: Yes (comment) Current home services: DME, [facility] Criminal Activity/Legal Involvement Pertinent to Current Situation/Hospitalization: No - Comment as needed  Activities of Daily Living Home Assistive Devices/Equipment: Walker (specify type), Oxygen ADL Screening (condition at time of admission) Patient's cognitive ability adequate to safely complete daily activities?: Yes Is the patient deaf or have difficulty hearing?: No Does the patient have difficulty seeing, even when wearing glasses/contacts?: Yes Does the patient have difficulty concentrating, remembering, or making decisions?: No Patient able to express need for assistance with ADLs?: Yes Does the patient have difficulty dressing or bathing?: Yes Independently performs ADLs?: No Communication: Independent Dressing (OT): Needs assistance Is this a change from baseline?: Pre-admission baseline Grooming: Needs assistance Is this a change from baseline?: Pre-admission baseline Feeding: Independent Bathing: Needs  assistance Is this a change from baseline?: Pre-admission baseline Toileting: Needs assistance Is this a change from baseline?: Pre-admission baseline In/Out Bed: Needs assistance Is this a change from baseline?:  Pre-admission baseline Walks in Home: Independent with device (comment) Does the patient have difficulty walking or climbing stairs?: Yes Weakness of Legs: Both Weakness of Arms/Hands: None  Permission Sought/Granted Permission sought to share information with : Case Manager, Family Supports Permission granted to share information with : Yes, Verbal Permission Granted  Share Information with NAME: Lavonna Rua  Permission granted to share info w AGENCY: [employer]; Aging, Disability,Aging, Disability & Transit Services - of [facility]  Permission granted to share info w Relationship: Daughter  Permission granted to share info w Contact Information: [phone]  Emotional Assessment Appearance:: Appears stated age Attitude/Demeanor/Rapport: Engaged Affect (typically observed): Accepting Orientation: : Oriented to Self, Oriented to Place, Oriented to  Time, Oriented to Situation Alcohol / Substance Use: Not Applicable Psych Involvement: No (comment)  Admission diagnosis:  Acute on chronic diastolic CHF (congestive heart failure) (HCC) [I50.33] Patient Active Problem List   Diagnosis Date Noted  . Acute on chronic diastolic CHF (congestive heart failure) (HCC) 06/03/2019  . Nausea without vomiting 04/08/2019  . Constipation 04/08/2019  . S/P AVR 11/13/2017  . Dyslipidemia 11/13/2017  . Bilateral carotid artery stenosis 11/13/2017  . Palliative care by specialist   . Goals of care, counseling/discussion   . Encounter for hospice care discussion   . Pleural effusion, right   . COPD with acute exacerbation (HCC) 05/18/2017  . Acute diastolic CHF (congestive heart failure) (HCC) 05/17/2017  . Chronic respiratory failure with hypoxia (HCC) 05/16/2017  . Acute CHF (congestive heart failure) (HCC) 05/16/2017  . Essential hypertension 05/16/2017  . CHF (congestive heart failure) (HCC) 05/15/2017  . COPD (chronic obstructive pulmonary disease) (HCC) 05/15/2017  . Acute on  chronic respiratory failure with hypoxia (HCC) 05/15/2017  . Pleural effusion on right 05/15/2017  . SOB (shortness of breath)   . Coronary artery disease involving native coronary artery of native heart without angina pectoris 01/31/2017  . Stenosis of carotid artery 01/31/2017  . Obstructive chronic bronchitis with exacerbation (HCC) 06/15/2012  . Pain in joint, shoulder region 05/27/2012  . CAD (coronary artery disease) of artery bypass graft 05/27/2012  . COPD exacerbation (HCC) 05/27/2012  . Aortic valve disorders 05/27/2012  . A-fib (HCC) 05/27/2012   PCP:  Kirstie Peri, MD Pharmacy:   [facility] [place] - [address] [place] [address] Phone: [phone] Fax: [phone]     Social Determinants of Health (SDOH) Interventions    Readmission Risk Interventions No flowsheet data found.

## 2019-06-03 NOTE — Telephone Encounter (Signed)
Yes, I agree with evaluation in the ED. Thank you!  LPC

## 2019-06-03 NOTE — Telephone Encounter (Signed)
Noted. Request will not be sent to pts pharmacy.

## 2019-06-03 NOTE — ED Notes (Signed)
Pt reports does not have a DNR. Family at bedside and also denies pt having DNR.

## 2019-06-03 NOTE — H&P (Signed)
History and Physical    Julie Burns AJO:[id number] DOB: 1930/10/15 DOA: 06/03/2019  Referring MD/NP/PA: Dr. Charm Barges PCP: Kirstie Peri, MD  Patient coming from: home  Chief Complaint: SOB and weakness.   HPI: Julie Burns is a 86 y.o. female with PMH significant for a. Fib, chronic resp failure, diastolic HF, HTN, HLD, hypothyroidism, anxiety, type 2 diabetes and CKD stage 3B; who presented to ED complaining of general malaise, fatigue and worsening breathing. Patient expressed SOB has been ongoing for the last 4 days or so, she was seen at [facility] ED and kept overnight, without significant changes in her symptoms or clear explanation of what was causing her to be more SOB. Despite some extra lasix at home she has continue experiencing worsening in her breathing and as found hypoxic by her caregiver.  No frank CP, but expressed feeling not well, patient denies nausea, vomiting, dysuria, hematuria, focal neurologic deficits, headaches or any other complaints.  In ED was found to be in a. Fib with RVR, elevated BNP and CXR demonstrating vascular congestion and interstitial edema; IV lopressor and IV lasix given in ED, patient oxygen also bump to 4L to maintain O2 sat above 90%. TRH consulted to place patient in the hospital for further evaluation and management of her a. Fib with RVR and CHF exacerbation. Cardiology service was consulted.   Past Medical/Surgical History: Past Medical History:  Diagnosis Date  . Age-related osteoporosis without current pathological fracture   . Anxiety disorder   . Atrial fibrillation (HCC)   . CAD (coronary artery disease)    12/2013-DES to RCA due to NSTEMI- staged PCI to LAD and CX (not ideal for CABG; Echocardiogram 01/16/10 EF 55%, severe LVH; Moderate AS; mild ot moderate TR  . Chronic kidney disease   . Chronic respiratory failure with hypoxia (HCC)   . COPD (chronic obstructive pulmonary disease) (HCC)   . Degenerative myopia with macular hole   .  Diabetes (HCC)    TYPE 2  . Hyperlipidemia   . Hypothyroidism   . Irritable bowel syndrome without diarrhea   . NSTEMI (non-ST elevated myocardial infarction) (HCC)    NSTEMI 3/05; s/p PTCA with stenting of left circumflex coronary artery 3/05; totally occluded small vessel OM  . Occlusion and stenosis of right carotid artery    Carotid Duplex 7/13 moderate disease (<70%) of the right internal carotid arteries, this is a high bifurcation, s/p left CEA without significant stenosis.  . Polyp of colon   . Presence of prosthetic heart valve    S/P aortic valve replacement 04/29/2008   . PVD (peripheral vascular disease) (HCC)   . Sicca syndrome (HCC)   . Stroke (cerebrum) (HCC)   . Unspecified abdominal hernia without obstruction or gangrene   . Vitamin D deficiency     Past Surgical History:  Procedure Laterality Date  . ABDOMINAL HERNIA REPAIR     per patient x 3  . AORTIC VALVE REPLACEMENT     S/P aortic valve replacement 04/29/2008   . CARDIAC CATHETERIZATION     by Flonnie Hailstone at [facility]; mLAD 30%, patent LCx stent, mRCA 50%, LVEDP 10, severe AS with AV mean gradient 46 mmHg, Right heart catheterization   . CAROTID ENDARTERECTOMY     Carotid Duplex 7/13 moderate disease (<70%) of the right internal carotid arteries, this is a high bifurcation, s/p left CEA without significant stenosis.  . COLONOSCOPY     per patient- several in the past in [facility], couple of polyps,  last TCS between 5-10 years ago.   [name] MASTECTOMY Left 2013   breast cancer    Social History:  reports that she quit smoking about 42 years ago. Her smoking use included cigarettes. She smoked 3.00 packs per day. She has never used smokeless tobacco. She reports that she does not drink alcohol or use drugs.  Allergies: Allergies  Allergen Reactions  . Aleve [Naproxen Sodium] Other (See Comments)    Makes me fell "drunk-like"  . Bee Venom Swelling  . Codeine Hives  . Ibuprofen Other (See Comments)     "drunk-like" feeling  . Morphine And Related Other (See Comments)    Patient states she "felt weird".  . Norvasc [Amlodipine Besylate] Swelling  . Sulfa Antibiotics   . Tylenol [Acetaminophen] Other (See Comments)    "drunk-like feeling"  . Valium [Diazepam]   . Zetia [Ezetimibe]   . Penicillins Hives and Rash    Has patient had a PCN reaction causing immediate rash, facial/tongue/throat swelling, SOB or lightheadedness with hypotension: yes Has patient had a PCN reaction causing severe rash involving mucus membranes or skin necrosis: Unknown Has patient had a PCN reaction that required hospitalization: No Has patient had a PCN reaction occurring within the last 10 years: No If all of the above answers are "NO", then may proceed with Cephalosporin use.     Family History:  Family History  Problem Relation Age of Onset  . Hypertension Mother   . CAD Mother   . Hypertension Father   . CAD Father   . Hypertension Sister   . CAD Sister   . Hypertension Brother   . CAD Brother   . Diabetes Brother   . Hypertension Brother   . Diabetes Brother   . COPD Son     Prior to Admission medications   Medication Sig Start Date End Date Taking? Authorizing Provider  alendronate (FOSAMAX) 70 MG tablet Take 70 mg by mouth once a week. Take with a full glass of water on an empty stomach. Take on Sunday's.    [provider]  amiodarone (PACERONE) 200 MG tablet Take 200 mg by mouth daily.    [provider]  apixaban (ELIQUIS) 5 MG TABS tablet Take 5 mg by mouth 2 (two) times daily.    [provider]  benzonatate (TESSALON) 200 MG capsule Take 200 mg by mouth 3 (three) times daily as needed for cough.    [provider]  budesonide (PULMICORT) 0.5 MG/2ML nebulizer solution Take 2 mLs (0.5 mg total) by nebulization 2 (two) times daily. 12/15/18   Julie Fraise, MD  Cholecalciferol (VITAMIN D3) 5000 units CAPS Take 1 capsule by mouth daily.    [provider]  famotidine (PEPCID) 20 MG tablet Take 1 tablet (20 mg total) by mouth daily. 05/08/19 05/07/20  Erenest Rasher, PA-C  furosemide (LASIX) 40 MG tablet Take 1 tablet (40 mg total) by mouth 2 (two) times daily. Patient taking differently: Take 160 mg by mouth 2 (two) times daily. 2 tabs in am and 2 in pm 05/21/17   Tat, Shanon Brow, MD  Ipratropium-Albuterol (COMBIVENT RESPIMAT) 20-100 MCG/ACT AERS respimat Inhale 1-2 puffs into the lungs every 4 (four) hours as needed for wheezing or shortness of breath.  05/10/17   [provider]  ipratropium-albuterol (DUONEB) 0.5-2.5 (3) MG/3ML SOLN Take 3 mLs by nebulization every 6 (six) hours as needed.    [provider]  isosorbide mononitrate (IMDUR) 60 MG 24 hr tablet Take 1  tablet (60 mg total) by mouth daily. 09/03/18 05/20/19  Rollene Rotunda, MD  levothyroxine (SYNTHROID, LEVOTHROID) 75 MCG tablet 75 mcg. Take one tablet daily by mouth except Mon. And Thurs. Take 1 1/2 tablets    [provider]  lisinopril (PRINIVIL,ZESTRIL) 20 MG tablet Take 20 mg by mouth daily.    [provider]  LORazepam (ATIVAN) 0.5 MG tablet Take 0.5 mg by mouth 2 (two) times daily as needed for anxiety or sleep.     [provider]  metoprolol succinate (TOPROL-XL) 100 MG 24 hr tablet Take 50 mg by mouth daily. Takes 50 mg tablet daily. If blood pressure is elevated take additional 50 mg tablet.    [provider]  Misc. Devices MISC underpads due to urinary incontinence 12/02/17   [provider]  Multiple Vitamins-Minerals (ICAPS AREDS FORMULA PO) Take 1 capsule by mouth 2 (two) times daily.     [provider]  nitroGLYCERIN (NITROSTAT) 0.4 MG SL tablet PLACE 1 TABLET UNDER THE TONGUE AT ONSET OF CHEST PAIN EVERY 5 MINTUES UP TO 3 TIMES AS NEEDED 11/20/18   Rollene Rotunda, MD  nystatin-triamcinolone ointment [facility]) Apply 1 application topically 2 (two) times daily as needed (rash).     [provider]  ondansetron (ZOFRAN) 4 MG tablet Take 4 mg by mouth every 8 (eight) hours as needed for nausea or vomiting.    [provider]  OXYGEN Inhale into the lungs.    [provider]  polyethylene glycol (MIRALAX / GLYCOLAX) packet Take 17 g by mouth daily.    [provider]  Tiotropium Bromide-Olodaterol (STIOLTO RESPIMAT) 2.5-2.5 MCG/ACT AERS Inhale 2 puffs into the lungs daily. 05/20/19   Steffanie Dunn, DO  Tiotropium Bromide-Olodaterol (STIOLTO RESPIMAT) 2.5-2.5 MCG/ACT AERS Inhale 2 puffs into the lungs daily. 05/20/19   Steffanie Dunn, DO  traMADol (ULTRAM) 50 MG tablet Take 50 mg by mouth every 6 (six) hours as needed for moderate pain.     [provider]    Review of Systems:  Negative except as otherwise mentioned on HPI.    Physical Exam: Vitals:   06/03/19 1353 06/03/19 1355 06/03/19 1400 06/03/19 1430  BP: (!) 162/104  (!) 166/144 (!) 114/95  Pulse:  (!) 117 (!) 108 (!) 144  Resp:  (!) 22 (!) 28 (!) 22  Temp:      TempSrc:      SpO2: 99% 99% 99% 100%  Weight:      Height:        Constitutional: in mild distress secondary to SOB and not feeling well; patient also expressed mild orthopnea. Eyes: PERRL, lids and conjunctivae normal, no icterus ENMT: Mucous membranes are moist. Posterior pharynx clear of any exudate or lesions.  Neck: normal, supple, no masses, no thyromegaly, mild JVD appreciated on exam. Respiratory: decreased BS at the bases, positive fine crackles bilaterally, positive tachypnea. Cardiovascular: irreguar irregular, no rubs, no gallops. Abdomen: no tenderness, no masses palpated. No hepatosplenomegaly. Bowel sounds positive.  Musculoskeletal: no clubbing / cyanosis. No joint deformity upper and lower extremities. Good ROM, no contractures. Normal muscle tone. 1+ edema appreciated bilaterally. Skin: no rashes, no petechiae. Neurologic: CN 2-12 grossly intact. Sensation intact, DTR normal. Strength 5/5 in all  4.  Psychiatric: oriented X 2 and cooperative with examination. Normal mood.    Labs on Admission: I have personally reviewed the following labs and imaging studies  CBC: Recent Labs  Lab 06/03/19 1127  WBC 8.0  HGB 13.7  HCT 43.4  MCV 92.9  PLT 234   Basic Metabolic Panel: Recent Labs  Lab 06/03/19 1127  NA 136  K 4.1  CL 93*  CO2 30  GLUCOSE 133*  BUN 41*  CREATININE 1.60*  CALCIUM 8.9   GFR: Estimated Creatinine Clearance: 28 mL/min (A) (by C-G formula based on SCr of 1.6 mg/dL (H)).  Coagulation Profile: Recent Labs  Lab 06/03/19 1127  INR 1.2    Radiological Exams on Admission: DG Chest Port 1 View  Result Date: 06/03/2019 CLINICAL DATA:  Shortness of breath EXAM: PORTABLE CHEST 1 VIEW COMPARISON:  06/01/2019 FINDINGS: Median sternotomy and aortic valve replacement. Stable cardiomegaly. Atherosclerotic calcification of the aortic knob. Pulmonary vascular congestion. Small right pleural effusion with persistent bibasilar opacities. No pneumothorax. Degenerative changes of the shoulders. IMPRESSION: 1. Stable cardiomegaly with pulmonary vascular congestion and small right pleural effusion. 2. Right greater than left bibasilar opacities may reflect a combination of atelectasis and/or pneumonia. Electronically Signed   By: Duanne Guess D.O.   On: 06/03/2019 11:48    EKG: Independently reviewed. A. Fib with RVR.  Assessment/Plan 1-Acute on chronic diastolic CHF (congestive heart failure) (HCC) -in the setting of a. Fib with RVR -will place in stepdown, in case drip is needed to control her HR -follow daily weights and strict I's and O's -heart healthy diet ordered -continue b-blocker -started on IV lasix -holding ACE/ARB in the setting of acute exacerbation and acute on chronic renal failure. -follow cardiology rec's -recent Echo with preserved EF, diastolic dysfunction and signs of pulmonary HTN  2-acute on chronic resp failure -due to #1 -pulmonary  HTN and COPD contributing -continue bronchodilators on neb management -follow clinical response -no steroids, as patient is not actively wheezing currently  3-a. Fib with RVR -continue amiodarone -adjusted dose of metoprolol providd -continue eliquis -Follow cardiology rec's  4-acute on chronic renal failure -stage 3b at baseline -due to worsening perfusion in the setting of CHF -minimize/avoid extra nephrotoxic agents -continue IV diuresis and closely monitor renal function and electrolytes  5-anxiety -continue PRN lorazepam  6-hypothyroidism -checking TSH -continue synthroid   7-chronic pain syndrome -continue tylenol and PRN tramadol    DVT prophylaxis: on eliquis Code Status: DNR Family Communication: no family at bedside Disposition Plan: home once rate is controlled and respiratory status improved. Consults called: cardiology Admission status: observation, stepdown, LOS < 2 midnights.    Time Spent: 70 minutes.  Vassie Loll MD [facility] Pager [phone]   06/03/2019, 3:04 PM

## 2019-06-03 NOTE — ED Provider Notes (Signed)
[facility] EMERGENCY DEPARTMENT Provider Note   CSN: [id number] Arrival date & time: 06/03/19  1102     History Chief Complaint  Patient presents with  . Shortness of Breath    Julie Burns is a 86 y.o. female.  She is brought in by EMS with complaints of shortness of breath and nausea that is been going on for a few weeks.  She was reportedly at another hospital a few days ago and they could not figure out what was wrong.  She is on 3 and half liters of oxygen 24/7.  Reportedly saturations were 86% on arrival of her caregiver this morning.  Had Covid in November.  Having some chest pressure.  No chest pain or abdominal pain.  She said the fluid on her legs is improved since her doctor increased her Lasix.  The history is provided by the patient.  Shortness of Breath Severity:  Moderate Onset quality:  Gradual Timing:  Intermittent Progression:  Worsening Chronicity:  New Relieved by:  Nothing Worsened by:  Activity Ineffective treatments:  Oxygen and diuretics Associated symptoms: chest pain   Associated symptoms: no abdominal pain, no cough, no fever, no headaches, no neck pain, no rash, no sore throat, no sputum production and no vomiting        Past Medical History:  Diagnosis Date  . Age-related osteoporosis without current pathological fracture   . Anxiety disorder   . Atrial fibrillation (HCC)   . CAD (coronary artery disease)    12/2013-DES to RCA due to NSTEMI- staged PCI to LAD and CX (not ideal for CABG; Echocardiogram 01/16/10 EF 55%, severe LVH; Moderate AS; mild ot moderate TR  . Chronic kidney disease   . Chronic respiratory failure with hypoxia (HCC)   . COPD (chronic obstructive pulmonary disease) (HCC)   . Degenerative myopia with macular hole   . Diabetes (HCC)    TYPE 2  . Hyperlipidemia   . Hypothyroidism   . Irritable bowel syndrome without diarrhea   . NSTEMI (non-ST elevated myocardial infarction) (HCC)    NSTEMI 3/05; s/p PTCA with stenting  of left circumflex coronary artery 3/05; totally occluded small vessel OM  . Occlusion and stenosis of right carotid artery    Carotid Duplex 7/13 moderate disease (<70%) of the right internal carotid arteries, this is a high bifurcation, s/p left CEA without significant stenosis.  . Polyp of colon   . Presence of prosthetic heart valve    S/P aortic valve replacement 04/29/2008   . PVD (peripheral vascular disease) (HCC)   . Sicca syndrome (HCC)   . Stroke (cerebrum) (HCC)   . Unspecified abdominal hernia without obstruction or gangrene   . Vitamin D deficiency     Patient Active Problem List   Diagnosis Date Noted  . Nausea without vomiting 04/08/2019  . Constipation 04/08/2019  . S/P AVR 11/13/2017  . Dyslipidemia 11/13/2017  . Bilateral carotid artery stenosis 11/13/2017  . Palliative care by specialist   . Goals of care, counseling/discussion   . Encounter for hospice care discussion   . Pleural effusion, right   . COPD with acute exacerbation (HCC) 05/18/2017  . Acute diastolic CHF (congestive heart failure) (HCC) 05/17/2017  . Chronic respiratory failure with hypoxia (HCC) 05/16/2017  . Acute CHF (congestive heart failure) (HCC) 05/16/2017  . Essential hypertension 05/16/2017  . CHF (congestive heart failure) (HCC) 05/15/2017  . COPD (chronic obstructive pulmonary disease) (HCC) 05/15/2017  . Acute on chronic respiratory failure with hypoxia (HCC)  05/15/2017  . Pleural effusion on right 05/15/2017  . SOB (shortness of breath)   . Coronary artery disease involving native coronary artery of native heart without angina pectoris 01/31/2017  . Stenosis of carotid artery 01/31/2017  . Obstructive chronic bronchitis with exacerbation (HCC) 06/15/2012  . Pain in joint, shoulder region 05/27/2012  . CAD (coronary artery disease) of artery bypass graft 05/27/2012  . COPD exacerbation (HCC) 05/27/2012  . Aortic valve disorders 05/27/2012  . A-fib (HCC) 05/27/2012    Past  Surgical History:  Procedure Laterality Date  . ABDOMINAL HERNIA REPAIR     per patient x 3  . AORTIC VALVE REPLACEMENT     S/P aortic valve replacement 04/29/2008   . CARDIAC CATHETERIZATION     by Flonnie HailstoneSaeed Payvar at [facility]; mLAD 30%, patent LCx stent, mRCA 50%, LVEDP 10, severe AS with AV mean gradient 46 mmHg, Right heart catheterization   . CAROTID ENDARTERECTOMY     Carotid Duplex 7/13 moderate disease (<70%) of the right internal carotid arteries, this is a high bifurcation, s/p left CEA without significant stenosis.  . COLONOSCOPY     per patient- [facility], couple of poly[facility], last TCS between 5-10 years ago.   [name]. MASTECTOMY Left 2013   breast cancer     OB History   No obstetric history on file.     Family History  Problem Relation Age of Onset  . Hypertension Mother   . CAD Mother   . Hypertension Father   . CAD Father   . Hypertension Sister   . CAD Sister   . Hypertension Brother   . CAD Brother   . Diabetes Brother   . Hypertension Brother   . Diabetes Brother   . COPD Son     Social History   Tobacco Use  . Smoking status: Former Smoker    Packs/day: 3.00    Types: Cigarettes    Quit date: 10/23/1976    Years since quitting: 42.6  . Smokeless tobacco: Never Used  Substance Use Topics  . Alcohol use: No  . Drug use: No    Home Medications Prior to Admission medications   Medication Sig Start Date End Date Taking? Authorizing Provider  alendronate (FOSAMAX) 70 MG tablet Take 70 mg by mouth once a week. Take with a full glass of water on an empty stomach. Take on Sunday's.    [provider]  amiodarone (PACERONE) 200 MG tablet Take 200 mg by mouth daily.    [provider]  apixaban (ELIQUIS) 5 MG TABS tablet Take 5 mg by mouth 2 (two) times daily.    [provider]  benzonatate (TESSALON) 200 MG capsule Take 200 mg by mouth 3 (three) times daily as needed for cough.    [provider]  budesonide  (PULMICORT) 0.5 MG/2ML nebulizer solution Take 2 mLs (0.5 mg total) by nebulization 2 (two) times daily. 12/15/18   Mechele ClaudeStacks, Warren, MD  Cholecalciferol (VITAMIN D3) 5000 units CAPS Take 1 capsule by mouth daily.    [provider]  famotidine (PEPCID) 20 MG tablet Take 1 tablet (20 mg total) by mouth daily. 05/08/19 05/07/20  Letta MedianHarper, Kristen S, PA-C  furosemide (LASIX) 40 MG tablet Take 1 tablet (40 mg total) by mouth 2 (two) times daily. Patient taking differently: Take 160 mg by mouth 2 (two) times daily. 2 tabs in am and 2 in pm 05/21/17   Tat, Onalee Huaavid, MD  Ipratropium-Albuterol (COMBIVENT RESPIMAT) 20-100  MCG/ACT AERS respimat Inhale 1-2 puffs into the lungs every 4 (four) hours as needed for wheezing or shortness of breath.  05/10/17   [provider]  ipratropium-albuterol (DUONEB) 0.5-2.5 (3) MG/3ML SOLN Take 3 mLs by nebulization every 6 (six) hours as needed.    [provider]  isosorbide mononitrate (IMDUR) 60 MG 24 hr tablet Take 1 tablet (60 mg total) by mouth daily. 09/03/18 05/20/19  Rollene Rotunda, MD  levothyroxine (SYNTHROID, LEVOTHROID) 75 MCG tablet 75 mcg. Take one tablet daily by mouth except Mon. And Thurs. Take 1 1/2 tablets    [provider]  lisinopril (PRINIVIL,ZESTRIL) 20 MG tablet Take 20 mg by mouth daily.    [provider]  LORazepam (ATIVAN) 0.5 MG tablet Take 0.5 mg by mouth 2 (two) times daily as needed for anxiety or sleep.     [provider]  metoprolol succinate (TOPROL-XL) 100 MG 24 hr tablet Take 50 mg by mouth daily. Takes 50 mg tablet daily. If blood pressure is elevated take additional 50 mg tablet.    [provider]  Misc. Devices MISC underpads due to urinary incontinence 12/02/17   [provider]  Multiple Vitamins-Minerals (ICAPS AREDS FORMULA PO) Take 1 capsule by mouth 2 (two) times daily.     [provider]  nitroGLYCERIN (NITROSTAT) 0.4 MG SL tablet PLACE 1 TABLET UNDER THE  TONGUE AT ONSET OF CHEST PAIN EVERY 5 MINTUES UP TO 3 TIMES AS NEEDED 11/20/18   Rollene Rotunda, MD  nystatin-triamcinolone ointment [facility]) Apply 1 application topically 2 (two) times daily as needed (rash).     [provider]  ondansetron (ZOFRAN) 4 MG tablet Take 4 mg by mouth every 8 (eight) hours as needed for nausea or vomiting.    [provider]  OXYGEN Inhale into the lungs.    [provider]  polyethylene glycol (MIRALAX / GLYCOLAX) packet Take 17 g by mouth daily.    [provider]  Tiotropium Bromide-Olodaterol (STIOLTO RESPIMAT) 2.5-2.5 MCG/ACT AERS Inhale 2 puffs into the lungs daily. 05/20/19   Steffanie Dunn, DO  Tiotropium Bromide-Olodaterol (STIOLTO RESPIMAT) 2.5-2.5 MCG/ACT AERS Inhale 2 puffs into the lungs daily. 05/20/19   Steffanie Dunn, DO  traMADol (ULTRAM) 50 MG tablet Take 50 mg by mouth every 6 (six) hours as needed for moderate pain.     [provider]    Allergies    Aleve [naproxen sodium], Bee venom, Codeine, Ibuprofen, Morphine and related, Norvasc [amlodipine besylate], Sulfa antibiotics, Tylenol [acetaminophen], Valium [diazepam], Zetia [ezetimibe], and Penicillins  Review of Systems   Review of Systems  Constitutional: Positive for appetite change. Negative for fever.  HENT: Negative for sore throat.   Eyes: Positive for visual disturbance (a;most blind).  Respiratory: Positive for shortness of breath. Negative for cough and sputum production.   Cardiovascular: Positive for chest pain and leg swelling.  Gastrointestinal: Positive for nausea. Negative for abdominal pain and vomiting.  Genitourinary: Negative for dysuria.  Musculoskeletal: Negative for neck pain.  Skin: Negative for rash.  Neurological: Negative for headaches.    Physical Exam Updated Vital Signs BP (!) 157/106 (BP Location: Right Arm)   Pulse 75   Temp 98.2 F (36.8 C) (Oral)   Resp 20   Ht 5' 6.5" (1.689 m)   Wt 88.5 kg   SpO2 98%    BMI 31.00 kg/m   Physical Exam Vitals and nursing note reviewed.  Constitutional:      General: She is  not in acute distress.    Appearance: She is well-developed.  HENT:     Head: Normocephalic and atraumatic.  Eyes:     Conjunctiva/sclera: Conjunctivae normal.  Cardiovascular:     Rate and Rhythm: Tachycardia present. Rhythm irregular.     Heart sounds: No murmur.  Pulmonary:     Effort: Pulmonary effort is normal. No respiratory distress.     Breath sounds: Normal breath sounds.  Abdominal:     Palpations: Abdomen is soft.     Tenderness: There is no abdominal tenderness.  Musculoskeletal:        General: Normal range of motion.     Cervical back: Neck supple.     Right lower leg: No tenderness. Edema present.     Left lower leg: No tenderness. Edema present.  Skin:    General: Skin is warm and dry.     Capillary Refill: Capillary refill takes less than 2 seconds.  Neurological:     General: No focal deficit present.     Mental Status: She is alert.     ED Results / Procedures / Treatments   Labs (all labs ordered are listed, but only abnormal results are displayed) Labs Reviewed  BASIC METABOLIC PANEL - Abnormal; Notable for the following components:      Result Value   Chloride 93 (*)    Glucose, Bld 133 (*)    BUN 41 (*)    Creatinine, Ser 1.60 (*)    GFR calc non Af Amer 29 (*)    GFR calc Af Amer 33 (*)    All other components within normal limits  BRAIN NATRIURETIC PEPTIDE - Abnormal; Notable for the following components:   B Natriuretic Peptide 1,086.0 (*)    All other components within normal limits  TROPONIN I (HIGH SENSITIVITY) - Abnormal; Notable for the following components:   Troponin I (High Sensitivity) 27 (*)    All other components within normal limits  TROPONIN I (HIGH SENSITIVITY) - Abnormal; Notable for the following components:   Troponin I (High Sensitivity) 28 (*)    All other components within normal limits  RESPIRATORY PANEL BY  RT PCR (FLU A&B, COVID)  SARS CORONAVIRUS 2 (TAT 6-24 HRS)  MRSA PCR SCREENING  CBC  PROTIME-INR  MAGNESIUM  TSH  BASIC METABOLIC PANEL    EKG EKG Interpretation  Date/Time:  Wednesday June 03 2019 11:13:53 EDT Ventricular Rate:  122 PR Interval:    QRS Duration: 102 QT Interval:  339 QTC Calculation: 483 R Axis:   -46 Text Interpretation: Atrial fibrillation LAD, consider left anterior fascicular block Abnormal R-wave progression, late transition LVH with secondary repolarization abnormality faster and afib compared with prior 5/19 Confirmed by Meridee Score [phone]) on 06/03/2019 12:15:09 PM   Radiology DG Chest Port 1 View  Result Date: 06/03/2019 CLINICAL DATA:  Shortness of breath EXAM: PORTABLE CHEST 1 VIEW COMPARISON:  06/01/2019 FINDINGS: Median sternotomy and aortic valve replacement. Stable cardiomegaly. Atherosclerotic calcification of the aortic knob. Pulmonary vascular congestion. Small right pleural effusion with persistent bibasilar opacities. No pneumothorax. Degenerative changes of the shoulders. IMPRESSION: 1. Stable cardiomegaly with pulmonary vascular congestion and small right pleural effusion. 2. Right greater than left bibasilar opacities may reflect a combination of atelectasis and/or pneumonia. Electronically Signed   By: Duanne Guess D.O.   On: 06/03/2019 11:48    Procedures .Critical Care Performed by: Terrilee Files, MD Authorized by: Terrilee Files, MD   Critical care provider statement:  Critical care time (minutes):  45   Critical care time was exclusive of:  Separately billable procedures and treating other patients   Critical care was necessary to treat or prevent imminent or life-threatening deterioration of the following conditions:  Respiratory failure and cardiac failure   Critical care was time spent personally by me on the following activities:  Discussions with consultants, evaluation of patient's response to treatment,  examination of patient, ordering and performing treatments and interventions, ordering and review of laboratory studies, ordering and review of radiographic studies, pulse oximetry, re-evaluation of patient's condition, obtaining history from patient or surrogate, review of old charts and development of treatment plan with patient or surrogate   I assumed direction of critical care for this patient from another provider in my specialty: no     (including critical care time)  Medications Ordered in ED Medications  sodium chloride flush (NS) 0.9 % injection 3 mL (has no administration in time range)  sodium chloride flush (NS) 0.9 % injection 3 mL (has no administration in time range)  0.9 %  sodium chloride infusion (has no administration in time range)  acetaminophen (TYLENOL) tablet 650 mg (has no administration in time range)  ondansetron (ZOFRAN) injection 4 mg (has no administration in time range)  furosemide (LASIX) injection 40 mg (has no administration in time range)  budesonide (PULMICORT) nebulizer solution 0.5 mg (has no administration in time range)  arformoterol (BROVANA) nebulizer solution 15 mcg (has no administration in time range)  amiodarone (PACERONE) tablet 200 mg (200 mg Oral Given 06/03/19 1747)  benzonatate (TESSALON) capsule 200 mg (has no administration in time range)  cholecalciferol (VITAMIN D3) tablet 2,000 Units (2,000 Units Oral Given 06/03/19 1747)  nitroGLYCERIN (NITROSTAT) SL tablet 0.4 mg (has no administration in time range)  multivitamin-lutein (OCUVITE-LUTEIN) capsule 1 capsule (1 capsule Oral Given 06/03/19 1746)  metoprolol succinate (TOPROL-XL) 24 hr tablet 50 mg (50 mg Oral Given 06/03/19 1746)  ondansetron (ZOFRAN) tablet 4 mg (has no administration in time range)  polyethylene glycol (MIRALAX / GLYCOLAX) packet 17 g (17 g Oral Not Given 06/03/19 1747)  traMADol (ULTRAM) tablet 50 mg (has no administration in time range)  apixaban (ELIQUIS) tablet 2.5 mg  (has no administration in time range)  levothyroxine (SYNTHROID) tablet 112.5 mcg (has no administration in time range)  levothyroxine (SYNTHROID) tablet 75 mcg (has no administration in time range)  umeclidinium bromide (INCRUSE ELLIPTA) 62.5 MCG/INH 1 puff (has no administration in time range)  levalbuterol (XOPENEX) nebulizer solution 0.63 mg (has no administration in time range)  ondansetron (ZOFRAN) injection 4 mg (4 mg Intravenous Given 06/03/19 1206)  furosemide (LASIX) injection 40 mg (40 mg Intravenous Given 06/03/19 1334)  amiodarone (CORDARONE) injection 150 mg (150 mg Intravenous Given 06/03/19 1338)  metoprolol tartrate (LOPRESSOR) injection 5 mg (5 mg Intravenous Given 06/03/19 1348)    ED Course  I have reviewed the triage vital signs and the nursing notes.  Pertinent labs & imaging results that were available during my care of the patient were reviewed by me and considered in my medical decision making (see chart for details).  Clinical Course as of Jun 02 1749  Wed Jun 03, 2019  1129 Differential includes A. fib, CHF, COPD, anemia, metabolic derangement, pneumothorax   [MB]  1131 EKG shows A. fib with a rate of 122 nonspecific ST-T changes.   [MB]  7341 Discussed with Dr. Bronson Ing.  He is recommending giving her 40 of IV Lasix 5 of IV metoprolol and  150 mg IV amiodarone.  If she converts and is feeling better she potentially could be discharged.  If not she would need to come in for further diuresis.   [MB]  1406 Patient heart rate improving down to about 100.  Daughter here and is concerned with her going home and I think she may benefit from some diuresis in the hospital and further rate management.  Hospitalist paged.   [MB]  1419 Discussed with Dr. Gwenlyn Perking from [facility] hospitalist who will evaluate the patient for admission.   [MB]    Clinical Course User Index [MB] Terrilee Files, MD   MDM Rules/Calculators/A&P                       Final Clinical Impression(s)  / ED Diagnoses Final diagnoses:  Atrial fibrillation with rapid ventricular response (HCC)  Acute on chronic congestive heart failure, unspecified heart failure type [facility])    Rx / DC Orders ED Discharge Orders    None       Terrilee Files, MD 06/03/19 1753

## 2019-06-03 NOTE — Telephone Encounter (Signed)
Called and spoke to pt's caregiver, Alona Bene. She states the pt went to [facility] ED a couple days ago because of her BP at 70/50 and her spo2 in the 70s on 3.5lpm. Pt was kept overnight and discharged the next morning. Per Alona Bene the pt's BP is better at 117/66 but spo2 is still ranging from mid 60s to 80s on 3.5lpm (baseline O2 need for pt). Alona Bene also states pt is c/o nausea and fatigue. Pt is alert and oriented. Merri Brunette and patient that pt will need to go back to the hospital for eval and to call EMS. Pt states she will go to [facility] as she didn't like [facility] (they are the same distance from pt's home).   Will forward to Dr. Chestine Spore as Lorain Childes.

## 2019-06-03 NOTE — Progress Notes (Signed)
Consult request has been received. CSW attempting to follow up at present time  Sharnika Binney M. Orlie Cundari LCSWA Transitions of Care  Clinical Social Worker  Ph: [phone] 

## 2019-06-03 NOTE — Telephone Encounter (Signed)
Sounds like you are saying a portable concentrator may not be appropriate for her? If she needs to be requalified, they can do that at [facility] now or she can go home with tanks (I'm guessing that is what you are referring to with portable and stationary?) and we can re-qualify her in the office. I want to ensure that what we send the patient home with is safe for her. If I am misunderstanding what you were saying, please correct me.  Steffanie Dunn, DO 06/03/19 5:25 PM [facility]

## 2019-06-03 NOTE — ED Triage Notes (Signed)
Pt brought in EMS, pt was seen at [facility] for same a few days ago and reports "couldn't figure out" what was wrong. Pt caregiver went in to assess pt this am and reports o2 saturation of 86%. Pt currently 98 on 4 liters [place].  Pt denies any pain. Pt reports "im short of breath all the time." pt reports had covid in November of 2020. Pt afebrile, on home 02 3.5 liters all the time.

## 2019-06-03 NOTE — Care Management Obs Status (Signed)
MEDICARE OBSERVATION STATUS NOTIFICATION   Patient Details  Name: Julie Burns MRN: [id number] Date of Birth: 18-May-1930   Medicare Observation Status Notification Given:  Other (see comment)(Provided to nurse to deliver to patient)    Deaken Jurgens Sherryle Lis, LCSW 06/03/2019, 4:30 PM

## 2019-06-03 NOTE — Telephone Encounter (Signed)
Called and spoke with Julie Burns.  Marchelle Folks stated [employer] can possibly be DME for Patient, with insurance change.  Marchelle Folks stated Patient has requested a POC, but Patient would have to be qualified, and per last OV, Patient has had problems keeping sats at 90% on current POC. Marchelle Folks stated order can be placed for O2 continuous with stationary, and portable tanks. Patient is currently in the ED at [facility].  Will route message to Dr. Chestine Spore

## 2019-06-04 DIAGNOSIS — E1122 Type 2 diabetes mellitus with diabetic chronic kidney disease: Secondary | ICD-10-CM | POA: Diagnosis not present

## 2019-06-04 DIAGNOSIS — I4891 Unspecified atrial fibrillation: Secondary | ICD-10-CM | POA: Diagnosis not present

## 2019-06-04 DIAGNOSIS — Z8616 Personal history of COVID-19: Secondary | ICD-10-CM | POA: Diagnosis not present

## 2019-06-04 DIAGNOSIS — Z66 Do not resuscitate: Secondary | ICD-10-CM | POA: Diagnosis not present

## 2019-06-04 DIAGNOSIS — I252 Old myocardial infarction: Secondary | ICD-10-CM | POA: Diagnosis not present

## 2019-06-04 DIAGNOSIS — J449 Chronic obstructive pulmonary disease, unspecified: Secondary | ICD-10-CM | POA: Diagnosis not present

## 2019-06-04 DIAGNOSIS — I25118 Atherosclerotic heart disease of native coronary artery with other forms of angina pectoris: Secondary | ICD-10-CM | POA: Diagnosis not present

## 2019-06-04 DIAGNOSIS — E039 Hypothyroidism, unspecified: Secondary | ICD-10-CM | POA: Diagnosis not present

## 2019-06-04 DIAGNOSIS — Z853 Personal history of malignant neoplasm of breast: Secondary | ICD-10-CM | POA: Diagnosis not present

## 2019-06-04 DIAGNOSIS — Z952 Presence of prosthetic heart valve: Secondary | ICD-10-CM | POA: Diagnosis not present

## 2019-06-04 DIAGNOSIS — I130 Hypertensive heart and chronic kidney disease with heart failure and stage 1 through stage 4 chronic kidney disease, or unspecified chronic kidney disease: Secondary | ICD-10-CM | POA: Diagnosis not present

## 2019-06-04 DIAGNOSIS — Z955 Presence of coronary angioplasty implant and graft: Secondary | ICD-10-CM | POA: Diagnosis not present

## 2019-06-04 DIAGNOSIS — Z951 Presence of aortocoronary bypass graft: Secondary | ICD-10-CM | POA: Diagnosis not present

## 2019-06-04 DIAGNOSIS — Z833 Family history of diabetes mellitus: Secondary | ICD-10-CM | POA: Diagnosis not present

## 2019-06-04 DIAGNOSIS — I10 Essential (primary) hypertension: Secondary | ICD-10-CM | POA: Diagnosis not present

## 2019-06-04 DIAGNOSIS — G894 Chronic pain syndrome: Secondary | ICD-10-CM | POA: Diagnosis not present

## 2019-06-04 DIAGNOSIS — E785 Hyperlipidemia, unspecified: Secondary | ICD-10-CM | POA: Diagnosis not present

## 2019-06-04 DIAGNOSIS — I2510 Atherosclerotic heart disease of native coronary artery without angina pectoris: Secondary | ICD-10-CM | POA: Diagnosis not present

## 2019-06-04 DIAGNOSIS — I272 Pulmonary hypertension, unspecified: Secondary | ICD-10-CM | POA: Diagnosis not present

## 2019-06-04 DIAGNOSIS — R0602 Shortness of breath: Secondary | ICD-10-CM | POA: Diagnosis not present

## 2019-06-04 DIAGNOSIS — I5033 Acute on chronic diastolic (congestive) heart failure: Secondary | ICD-10-CM | POA: Diagnosis not present

## 2019-06-04 DIAGNOSIS — Z9012 Acquired absence of left breast and nipple: Secondary | ICD-10-CM | POA: Diagnosis not present

## 2019-06-04 DIAGNOSIS — N1832 Chronic kidney disease, stage 3b: Secondary | ICD-10-CM | POA: Diagnosis not present

## 2019-06-04 DIAGNOSIS — N179 Acute kidney failure, unspecified: Secondary | ICD-10-CM | POA: Diagnosis not present

## 2019-06-04 DIAGNOSIS — J9621 Acute and chronic respiratory failure with hypoxia: Secondary | ICD-10-CM | POA: Diagnosis not present

## 2019-06-04 DIAGNOSIS — Z8673 Personal history of transient ischemic attack (TIA), and cerebral infarction without residual deficits: Secondary | ICD-10-CM | POA: Diagnosis not present

## 2019-06-04 DIAGNOSIS — Z9981 Dependence on supplemental oxygen: Secondary | ICD-10-CM | POA: Diagnosis not present

## 2019-06-04 DIAGNOSIS — Z79899 Other long term (current) drug therapy: Secondary | ICD-10-CM | POA: Diagnosis not present

## 2019-06-04 DIAGNOSIS — Z87891 Personal history of nicotine dependence: Secondary | ICD-10-CM | POA: Diagnosis not present

## 2019-06-04 DIAGNOSIS — I739 Peripheral vascular disease, unspecified: Secondary | ICD-10-CM | POA: Diagnosis not present

## 2019-06-04 DIAGNOSIS — J441 Chronic obstructive pulmonary disease with (acute) exacerbation: Secondary | ICD-10-CM | POA: Diagnosis not present

## 2019-06-04 LAB — MRSA PCR SCREENING: MRSA by PCR: NEGATIVE

## 2019-06-04 LAB — BASIC METABOLIC PANEL
Anion gap: 14 (ref 5–15)
BUN: 42 mg/dL — ABNORMAL HIGH (ref 8–23)
CO2: 29 mmol/L (ref 22–32)
Calcium: 9.1 mg/dL (ref 8.9–10.3)
Chloride: 96 mmol/L — ABNORMAL LOW (ref 98–111)
Creatinine, Ser: 1.61 mg/dL — ABNORMAL HIGH (ref 0.44–1.00)
GFR calc Af Amer: 33 mL/min — ABNORMAL LOW (ref >60)
GFR calc non Af Amer: 28 mL/min — ABNORMAL LOW (ref >60)
Glucose, Bld: 117 mg/dL — ABNORMAL HIGH (ref 70–99)
Potassium: 4 mmol/L (ref 3.5–5.1)
Sodium: 139 mmol/L (ref 135–145)

## 2019-06-04 MED ORDER — CHLORHEXIDINE GLUCONATE CLOTH 2 % EX PADS
6.0000 | MEDICATED_PAD | Freq: Every day | CUTANEOUS | Status: DC
  Administered 2019-06-04 – 2019-06-09 (×6): 6 via TOPICAL

## 2019-06-04 MED ORDER — AMIODARONE LOAD VIA INFUSION
150.0000 mg | Freq: Once | INTRAVENOUS | Status: AC
  Administered 2019-06-04: 10:00:00 150 mg via INTRAVENOUS
  Filled 2019-06-04: qty 83.34

## 2019-06-04 MED ORDER — PROCHLORPERAZINE EDISYLATE 10 MG/2ML IJ SOLN
5.0000 mg | Freq: Once | INTRAMUSCULAR | Status: AC
  Administered 2019-06-04: 22:00:00 5 mg via INTRAVENOUS
  Filled 2019-06-04: qty 2

## 2019-06-04 MED ORDER — AMIODARONE HCL IN DEXTROSE 360-4.14 MG/200ML-% IV SOLN
30.0000 mg/h | INTRAVENOUS | Status: DC
  Administered 2019-06-05 – 2019-06-08 (×6): 30 mg/h via INTRAVENOUS
  Filled 2019-06-04 (×10): qty 200

## 2019-06-04 MED ORDER — AMIODARONE HCL IN DEXTROSE 360-4.14 MG/200ML-% IV SOLN
60.0000 mg/h | INTRAVENOUS | Status: DC
  Administered 2019-06-04 (×2): 60 mg/h via INTRAVENOUS

## 2019-06-04 NOTE — Progress Notes (Signed)
PROGRESS NOTE    Julie Burns  EHU:[id number] DOB: 11-05-1930 DOA: 06/03/2019 PCP: Kirstie Peri, MD     Brief Narrative:  86 y.o. female with PMH significant for a. Fib, chronic resp failure, diastolic HF, HTN, HLD, hypothyroidism, anxiety, type 2 diabetes and CKD stage 3B; who presented to ED complaining of general malaise, fatigue and worsening breathing. Patient expressed SOB has been ongoing for the last 4 days or so, she was seen at [facility] ED and kept overnight, without significant changes in her symptoms or clear explanation of what was causing her to be more SOB. Despite some extra lasix at home she has continue experiencing worsening in her breathing and as found hypoxic by her caregiver.  No frank CP, but expressed feeling not well, patient denies nausea, vomiting, dysuria, hematuria, focal neurologic deficits, headaches or any other complaints.  In ED was found to be in a. Fib with RVR, elevated BNP and CXR demonstrating vascular congestion and interstitial edema; IV lopressor and IV lasix given in ED, patient oxygen also bump to 4L to maintain O2 sat above 90%. TRH consulted to place patient in the hospital for further evaluation and management of her a. Fib with RVR and CHF exacerbation. Cardiology service was consulted.    Assessment & Plan: 1-Acute on chronic diastolic heart failure -In the setting of A. fib with RVR -Remains in a stepdown -Continue adjusted dose of metoprolol and initiate IV amiodarone. -Continue IV Lasix and closely follow renal function electrolytes. -Continue holding ACE inhibitor/ARB in the setting of acute exacerbation and the presence of acute on chronic renal failure. -Follow daily weights, strict I's and O's and low-sodium diet. -Cardiology service is on board and will follow recommendations. -Recent echocardiogram demonstrating preserved ejection fraction, diastolic dysfunction and signs of pulmonary hypertension.  2-acute on chronic respiratory  failure with hypoxia: In the setting of COPD -Appears to be secondary to problem #1 -Pulmonary hypertension and underlying COPD contributing -Continue bronchodilators and nebulizer management -Actively no wheezing and no requiring a steroids. -Follow clinical response to above treatment and wean down oxygen supplementation to baseline as tolerated.  3-A. fib with RVR: Chronic/longstanding paroxysmal atrial fibrillation. -Continue adjusted dose of metoprolol -Loading dose of amiodarone and initiation of amiodarone drip started -Continue Eliquis for secondary prevention -Follow cardiology service recommendations.  4-acute on chronic stage III renal failure -Patient stage IIIb at baseline -Appears to be experiencing worsening in renal function secondary to decreased perfusion in the setting of CHF -Continue to minimize/avoid extra nephrotoxic agents -Continue IV diuresis and follow renal function trend closely.  5-anxiety -Overall stable mood -Continue as needed lorazepam.  6-hypothyroidism -TSH within normal limits -Continue Synthroid  7-chronic pain syndrome -Overall stable -Continue Tylenol and as needed tramadol.  DVT prophylaxis: Eliquis. Code Status: DNR Family Communication: No family at bedside. Disposition Plan: Remains inpatient, still having trouble controlling heart rate and will require the need of IV amiodarone and initiation of amiodarone drip.  Continue Eliquis and adjusted dose of metoprolol.  Continue oxygen supplementation and wean down as tolerated to baseline.  Closely follow renal function and electrolytes.  Consultants:   Cardiology service  Procedures:   See below for x-ray reports.  Antimicrobials:  Anti-infectives (From admission, onward)   None       Subjective: Denies chest pain, no vomiting, no fever.  Patient reports some nausea after eating breakfast and expressed palpitation sensation on minimal exertion.  Still short of breath, even  expressed overall improvement in comparison to admission.  Objective: Vitals:   06/04/19 0907 06/04/19 0913 06/04/19 1100 06/04/19 1142  BP: (!) 129/107  129/80   Pulse:  (!) 115 84   Resp:      Temp:    (!) 97.3 F (36.3 C)  TempSrc:    Oral  SpO2:   91%   Weight:      Height:        Intake/Output Summary (Last 24 hours) at 06/04/2019 1630 Last data filed at 06/04/2019 1536 Gross per 24 hour  Intake 475.39 ml  Output --  Net 475.39 ml   Filed Weights   06/03/19 1112 06/03/19 1745  Weight: 88.5 kg 87.5 kg    Examination: General exam: Alert, awake, oriented x 3; afebrile and denying chest pain.  Patient expressed shortness of breath (even improved) and some palpitations with exertion. Respiratory system: Decreased breath sounds at the bases, no wheezing, improved air movement bilaterally.  No using accessory muscles.  Cardiovascular system: Irregular irregular, no rubs, no gallops, no JVD on exam.   Gastrointestinal system: Abdomen is nondistended, soft and nontender. No organomegaly or masses felt. Normal bowel sounds heard. Central nervous system: Alert and oriented. No focal neurological deficits. Extremities: No cyanosis or clubbing. Skin: No rashes, no petechiae. Psychiatry: Judgement and insight appear normal. Mood & affect appropriate.     Data Reviewed: I have personally reviewed following labs and imaging studies  CBC: Recent Labs  Lab 06/03/19 1127  WBC 8.0  HGB 13.7  HCT 43.4  MCV 92.9  PLT 234   Basic Metabolic Panel: Recent Labs  Lab 06/03/19 1127 06/04/19 0417  NA 136 139  K 4.1 4.0  CL 93* 96*  CO2 30 29  GLUCOSE 133* 117*  BUN 41* 42*  CREATININE 1.60* 1.61*  CALCIUM 8.9 9.1  MG 2.2  --    GFR: Estimated Creatinine Clearance: 27.4 mL/min (A) (by C-G formula based on SCr of 1.61 mg/dL (H)).  Coagulation Profile: Recent Labs  Lab 06/03/19 1127  INR 1.2   Thyroid Function Tests: Recent Labs    06/03/19 1127  TSH 1.948     Recent Results (from the past 240 hour(s))  SARS CORONAVIRUS 2 (TAT 6-24 HRS) Nasopharyngeal Nasopharyngeal Swab     Status: None   Collection Time: 06/03/19 12:58 PM   Specimen: Nasopharyngeal Swab  Result Value Ref Range Status   SARS Coronavirus 2 NEGATIVE NEGATIVE Final    Comment: (NOTE) SARS-CoV-2 target nucleic acids are NOT DETECTED. The SARS-CoV-2 RNA is generally detectable in upper and lower respiratory specimens during the acute phase of infection. Negative results do not preclude SARS-CoV-2 infection, do not rule out co-infections with other pathogens, and should not be used as the sole basis for treatment or other patient management decisions. Negative results must be combined with clinical observations, patient history, and epidemiological information. The expected result is Negative. Fact Sheet for Patients: HairSlick.no Fact Sheet for Healthcare Providers: quierodirigir.com This test is not yet approved or cleared by the [place] FDA and  has been authorized for detection and/or diagnosis of SARS-CoV-2 by FDA under an Emergency Use Authorization (EUA). This EUA will remain  in effect (meaning this test can be used) for the duration of the COVID-19 declaration under Section 56 4(b)(1) of the Act, 21 U.S.C. section 360bbb-3(b)(1), unless the authorization is terminated or revoked sooner. Performed at [facility], [address] [place], [place] [address]   Respiratory Panel by RT PCR (Flu A&B, Covid) - Nasopharyngeal Swab  Status: None   Collection Time: 06/03/19  2:59 PM   Specimen: Nasopharyngeal Swab  Result Value Ref Range Status   SARS Coronavirus 2 by RT PCR NEGATIVE NEGATIVE Final    Comment: (NOTE) SARS-CoV-2 target nucleic acids are NOT DETECTED. The SARS-CoV-2 RNA is generally detectable in upper respiratoy specimens during the acute phase of infection. The lowest concentration  of SARS-CoV-2 viral copies this assay can detect is 131 copies/mL. A negative result does not preclude SARS-Cov-2 infection and should not be used as the sole basis for treatment or other patient management decisions. A negative result may occur with  improper specimen collection/handling, submission of specimen other than nasopharyngeal swab, presence of viral mutation(s) within the areas targeted by this assay, and inadequate number of viral copies (<131 copies/mL). A negative result must be combined with clinical observations, patient history, and epidemiological information. The expected result is Negative. Fact Sheet for Patients:  PinkCheek.be Fact Sheet for Healthcare Providers:  GravelBags.it This test is not yet ap proved or cleared by the [place] FDA and  has been authorized for detection and/or diagnosis of SARS-CoV-2 by FDA under an Emergency Use Authorization (EUA). This EUA will remain  in effect (meaning this test can be used) for the duration of the COVID-19 declaration under Section 564(b)(1) of the Act, 21 U.S.C. section 360bbb-3(b)(1), unless the authorization is terminated or revoked sooner.    Influenza A by PCR NEGATIVE NEGATIVE Final   Influenza B by PCR NEGATIVE NEGATIVE Final    Comment: (NOTE) The Xpert Xpress SARS-CoV-2/FLU/RSV assay is intended as an aid in  the diagnosis of influenza from Nasopharyngeal swab specimens and  should not be used as a sole basis for treatment. Nasal washings and  aspirates are unacceptable for Xpert Xpress SARS-CoV-2/FLU/RSV  testing. Fact Sheet for Patients: PinkCheek.be Fact Sheet for Healthcare Providers: GravelBags.it This test is not yet approved or cleared by the [place] FDA and  has been authorized for detection and/or diagnosis of SARS-CoV-2 by  FDA under an Emergency Use Authorization (EUA).  This EUA will remain  in effect (meaning this test can be used) for the duration of the  Covid-19 declaration under Section 564(b)(1) of the Act, 21  U.S.C. section 360bbb-3(b)(1), unless the authorization is  terminated or revoked. Performed at [facility], [address] [place], [place] [address]   MRSA PCR Screening     Status: None   Collection Time: 06/03/19  5:03 PM   Specimen: Nasal Mucosa; Nasopharyngeal  Result Value Ref Range Status   MRSA by PCR NEGATIVE NEGATIVE Final    Comment:        The GeneXpert MRSA Assay (FDA approved for NASAL specimens only), is one component of a comprehensive MRSA colonization surveillance program. It is not intended to diagnose MRSA infection nor to guide or monitor treatment for MRSA infections. Performed at [facility], [address] [place], [place] [address]      Radiology Studies: [facility] Chest [facility] 1 View  Result Date: 06/03/2019 CLINICAL DATA:  Shortness of breath EXAM: PORTABLE CHEST 1 VIEW COMPARISON:  06/01/2019 FINDINGS: Median sternotomy and aortic valve replacement. Stable cardiomegaly. Atherosclerotic calcification of the aortic knob. Pulmonary vascular congestion. Small right pleural effusion with persistent bibasilar opacities. No pneumothorax. Degenerative changes of the shoulders. IMPRESSION: 1. Stable cardiomegaly with pulmonary vascular congestion and small right pleural effusion. 2. Right greater than left bibasilar opacities may reflect a combination of atelectasis and/or pneumonia. Electronically Signed   By: Davina Poke  D.O.   On: 06/03/2019 11:48    Scheduled Meds: . apixaban  2.5 mg Oral BID  . arformoterol  15 mcg Nebulization BID  . budesonide (PULMICORT) nebulizer solution  0.5 mg Nebulization BID  . Chlorhexidine Gluconate Cloth  6 each Topical Daily  . cholecalciferol  2,000 Units Oral Daily  . furosemide  40 mg Intravenous BID  . levothyroxine  112.5 mcg Oral Once per day on Mon Thu  . [START ON  06/05/2019] levothyroxine  75 mcg Oral Once per day on Sun Tue Wed Fri Sat  . mouth rinse  15 mL Mouth Rinse BID  . metoprolol succinate  50 mg Oral BID  . multivitamin-lutein  1 capsule Oral BID  . polyethylene glycol  17 g Oral Daily  . sodium chloride flush  3 mL Intravenous Q12H  . umeclidinium bromide  1 puff Inhalation Daily   Continuous Infusions: . sodium chloride    . amiodarone       LOS: 0 days    Time spent: 35 minutes. Greater than 50% of this time was spent in direct contact with the patient, coordinating care and discussing relevant ongoing clinical issues, including discussion about A. fib with RVR and how heart regular rhythm is contributing to acute on chronic heart failure exacerbation with worsening her breathing.  Despite some improvement overnight her heart rate continued to be irregular and just sitting on the age it jumps into the 140s 150s range.  Patient denies chest pain.  Case discussed with cardiology service who has recommended amiodarone loading dose and initiation of amiodarone drip.  Continue anticoagulation and based on patient response determine the need or not for cardioversion.     Vassie Loll, MD [facility] Pager [phone]   06/04/2019, 4:30 PM

## 2019-06-04 NOTE — Telephone Encounter (Signed)
Pt is currently admitted  I have called her and LMTCB so that when she is d/c we can discuss this issue

## 2019-06-04 NOTE — Consult Note (Addendum)
Cardiology Consult    Patient ID: Julie Burns; [id number]; [birth year]   Admit date: 06/03/2019 Date of Consult: 06/04/2019  Primary Care Provider: Kirstie Peri, MD Primary Cardiologist: Rollene Rotunda, MD   Patient Profile    Julie Burns is a 86 y.o. female with past medical history of chronic diastolic CHF, s/p aortic valve replacement in 2010 and 2014, CAD (s/p DES to RCA and staged PCI of LAD and LCx in 2015), paroxysmal atrial fibrillation (s/p DCCV in 2017), carotid artery stenosis (s/p L CEA), HTN, HLD, COPD and severe pulmonary HTN who is being seen today for the evaluation of CHF and atrial fibrillation at the request of Dr. Gwenlyn Perking.   History of Present Illness    Julie Burns most recently had a follow-up telehealth visit with Dr. Antoine Poche in 03/2019. She reported having chronic dyspnea in the setting of COPD and was on 2.5 L nasal cannula. She denied any associated chest pain or palpitations. A repeat echocardiogram was obtained and showed a preserved EF of 60 to 65% with no regional wall motion abnormalities. She did have mild mitral stenosis but the prosthetic aortic valve was functioning normally. Was noted to have grade 2 diastolic dysfunction and severely elevated pulmonary pressures. Also had biatrial dilation.  She presented to [facility] ED on 06/03/2019 for evaluation of worsening dyspnea and nausea for the past several weeks. Had previously been evaluated at [facility] but not given a diagnosis and oxygen saturations were still in the 60's to 80's upon returning home. She reports progressive symptoms for the past two weeks. Has chronic dyspnea on exertion but this has acutely worsened along with lower extremity edema. No reported orthopnea or PND. Weight has been in the 190's on her home scales but has started to trend upwards to 200 lbs and Lasix was increased by her PCP from 40mg  BID to 80mg  BID. She denies any chest pain or palpitations. Unaware of her arrhythmia.  Reports having an argument with her insurance company the other day about her home oxygen and wonders if the stress of this triggered her arrhythmia.  Initial labs showed WBC 8.0, Hgb 13.7, platelets 234, Na+ 136, K+ 4.1 and creatinine 1.60 (baseline 0.9 - 1.0). BNP 1086. Initial and delta HS Troponin flat at 27 and 28. TSH 1.948. Mg 2.2. CXR showed stable cardiomegaly with pulmonary vascular congestion and a small right pleural effusion. EKG showed atrial fibrillation with RVR, HR 122 with LVH and nonspecific ST abnormalities along lateral leads.   She received IV Lasix 40 mg while in the ED and was started on 40 mg twice daily at the time of admission. She also received IV Lopressor for rate control and has been restarted on PTA Toprol-XL 50 mg twice daily. Received 150 mg of IV Amiodarone and was restarted on PTA Amiodarone 200 mg daily as well. She has remained in atrial fibrillation overnight with HR in the 100's to 120's at rest, peaking into the 130's while talking.    Past Medical History:  Diagnosis Date  . Age-related osteoporosis without current pathological fracture   . Anxiety disorder   . Atrial fibrillation (HCC)   . CAD (coronary artery disease)    12/2013-DES to RCA due to NSTEMI- staged PCI to LAD and CX (not ideal for CABG; Echocardiogram 01/16/10 EF 55%, severe LVH; Moderate AS; mild ot moderate TR  . Chronic kidney disease   . Chronic respiratory failure with hypoxia (HCC)   . COPD (chronic obstructive pulmonary  disease) (HCC)   . Degenerative myopia with macular hole   . Diabetes (HCC)    TYPE 2  . Hyperlipidemia   . Hypothyroidism   . Irritable bowel syndrome without diarrhea   . NSTEMI (non-ST elevated myocardial infarction) (HCC)    NSTEMI 3/05; s/p PTCA with stenting of left circumflex coronary artery 3/05; totally occluded small vessel OM  . Occlusion and stenosis of right carotid artery    Carotid Duplex 7/13 moderate disease (<70%) of the right internal carotid  arteries, this is a high bifurcation, s/p left CEA without significant stenosis.  . Polyp of colon   . Presence of prosthetic heart valve    S/P aortic valve replacement 04/29/2008   . PVD (peripheral vascular disease) (HCC)   . Sicca syndrome (HCC)   . Stroke (cerebrum) (HCC)   . Unspecified abdominal hernia without obstruction or gangrene   . Vitamin D deficiency     Past Surgical History:  Procedure Laterality Date  . ABDOMINAL HERNIA REPAIR     per patient x 3  . AORTIC VALVE REPLACEMENT     S/P aortic valve replacement 04/29/2008   . CARDIAC CATHETERIZATION     by Flonnie Hailstone at [facility]; mLAD 30%, patent LCx stent, mRCA 50%, LVEDP 10, severe AS with AV mean gradient 46 mmHg, Right heart catheterization   . CAROTID ENDARTERECTOMY     Carotid Duplex 7/13 moderate disease (<70%) of the right internal carotid arteries, this is a high bifurcation, s/p left CEA without significant stenosis.  . COLONOSCOPY     per patient- several in the past in [facility], couple of polyps, last TCS between 5-10 years ago.   [name] MASTECTOMY Left 2013   breast cancer     Home Medications:  Prior to Admission medications   Medication Sig Start Date End Date Taking? Authorizing Provider  alendronate (FOSAMAX) 70 MG tablet Take 70 mg by mouth once a week. Take with a full glass of water on an empty stomach. Take on Sunday's.    [provider]  amiodarone (PACERONE) 200 MG tablet Take 200 mg by mouth daily.    [provider]  apixaban (ELIQUIS) 5 MG TABS tablet Take 5 mg by mouth 2 (two) times daily.    [provider]  benzonatate (TESSALON) 200 MG capsule Take 200 mg by mouth 3 (three) times daily as needed for cough.    [provider]  budesonide (PULMICORT) 0.5 MG/2ML nebulizer solution Take 2 mLs (0.5 mg total) by nebulization 2 (two) times daily. 12/15/18   Mechele Claude, MD  Cholecalciferol (VITAMIN D3) 5000 units CAPS Take 1 capsule by mouth daily.    [provider]  famotidine (PEPCID) 20 MG tablet Take 1 tablet (20 mg total) by mouth daily. 05/08/19 05/07/20  Letta Median, PA-C  furosemide (LASIX) 40 MG tablet Take 1 tablet (40 mg total) by mouth 2 (two) times daily. Patient taking differently: Take 160 mg by mouth 2 (two) times daily.  05/21/17   Catarina Hartshorn, MD  Ipratropium-Albuterol (COMBIVENT RESPIMAT) 20-100 MCG/ACT AERS respimat Inhale 1-2 puffs into the lungs every 4 (four) hours as needed for wheezing or shortness of breath.  05/10/17   [provider]  ipratropium-albuterol (DUONEB) 0.5-2.5 (3) MG/3ML SOLN Take 3 mLs by nebulization every 6 (six) hours as needed.    [provider]  isosorbide mononitrate (IMDUR) 60 MG 24 hr tablet Take 1 tablet (60 mg total) by mouth daily. 09/03/18 06/03/19  Hochrein,  Fayrene FearingJames, MD  levothyroxine (SYNTHROID, LEVOTHROID) 75 MCG tablet 75 mcg. Take one tablet daily by mouth except Mon. And Thurs. Take 1 1/2 tablets    [provider]  lisinopril (PRINIVIL,ZESTRIL) 20 MG tablet Take 20 mg by mouth daily.    [provider]  LORazepam (ATIVAN) 0.5 MG tablet Take 0.5 mg by mouth 2 (two) times daily as needed for anxiety or sleep.     [provider]  metoprolol succinate (TOPROL-XL) 100 MG 24 hr tablet Take 50 mg by mouth daily. Takes 50 mg tablet daily. If blood pressure is elevated take additional 50 mg tablet.    [provider]  Multiple Vitamins-Minerals (ICAPS AREDS FORMULA PO) Take 1 capsule by mouth 2 (two) times daily.     [provider]  nitroGLYCERIN (NITROSTAT) 0.4 MG SL tablet PLACE 1 TABLET UNDER THE TONGUE AT ONSET OF CHEST PAIN EVERY 5 MINTUES UP TO 3 TIMES AS NEEDED Patient taking differently: Place 0.4 mg under the tongue every 5 (five) minutes as needed.  11/20/18   Rollene RotundaHochrein, James, MD  nystatin-triamcinolone ointment [facility](MYCOLOG) Apply 1 application topically 2 (two) times daily as needed (rash).     [provider]  ondansetron  (ZOFRAN) 4 MG tablet Take 4 mg by mouth every 8 (eight) hours as needed for nausea or vomiting.    [provider]  ondansetron (ZOFRAN-ODT) 4 MG disintegrating tablet Take 4 mg by mouth every 8 (eight) hours as needed. 05/28/19   [provider]  OXYGEN Inhale into the lungs continuous.     [provider]  pantoprazole (PROTONIX) 40 MG tablet Take 40 mg by mouth daily. 05/11/19   [provider]  polyethylene glycol (MIRALAX / GLYCOLAX) packet Take 17 g by mouth daily.    [provider]  Tiotropium Bromide-Olodaterol (STIOLTO RESPIMAT) 2.5-2.5 MCG/ACT AERS Inhale 2 puffs into the lungs daily. 05/20/19   Steffanie Dunnlark, Laura P, DO  Tiotropium Bromide-Olodaterol (STIOLTO RESPIMAT) 2.5-2.5 MCG/ACT AERS Inhale 2 puffs into the lungs daily. 05/20/19   Steffanie Dunnlark, Laura P, DO  traMADol (ULTRAM) 50 MG tablet Take 50 mg by mouth every 6 (six) hours as needed for moderate pain.     [provider]    Inpatient Medications: Scheduled Meds: . amiodarone  150 mg Intravenous Once  . apixaban  2.5 mg Oral BID  . arformoterol  15 mcg Nebulization BID  . budesonide (PULMICORT) nebulizer solution  0.5 mg Nebulization BID  . Chlorhexidine Gluconate Cloth  6 each Topical Daily  . cholecalciferol  2,000 Units Oral Daily  . furosemide  40 mg Intravenous BID  . levothyroxine  112.5 mcg Oral Once per day on Mon Thu  . [START ON 06/05/2019] levothyroxine  75 mcg Oral Once per day on Sun Tue Wed Fri Sat  . mouth rinse  15 mL Mouth Rinse BID  . metoprolol succinate  50 mg Oral BID  . multivitamin-lutein  1 capsule Oral BID  . polyethylene glycol  17 g Oral Daily  . sodium chloride flush  3 mL Intravenous Q12H  . umeclidinium bromide  1 puff Inhalation Daily   Continuous Infusions: . sodium chloride    . amiodarone     Followed by  . amiodarone     PRN Meds: sodium chloride, acetaminophen, benzonatate, levalbuterol, melatonin, nitroGLYCERIN, ondansetron (ZOFRAN) IV,  ondansetron, sodium chloride flush, traMADol  Allergies:    Allergies  Allergen Reactions  . Aleve [Naproxen Sodium] Other (See Comments)    Makes me  fell "drunk-like"  . Bee Venom Swelling  . Codeine Hives  . Ibuprofen Other (See Comments)    "drunk-like" feeling  . Morphine And Related Other (See Comments)    Patient states she "felt weird".  . Norvasc [Amlodipine Besylate] Swelling  . Sulfa Antibiotics   . Tylenol [Acetaminophen] Other (See Comments)    "drunk-like feeling"  . Valium [Diazepam]   . Zetia [Ezetimibe]   . Penicillins Hives and Rash    Has patient had a PCN reaction causing immediate rash, facial/tongue/throat swelling, SOB or lightheadedness with hypotension: yes Has patient had a PCN reaction causing severe rash involving mucus membranes or skin necrosis: Unknown Has patient had a PCN reaction that required hospitalization: No Has patient had a PCN reaction occurring within the last 10 years: No If all of the above answers are "NO", then may proceed with Cephalosporin use.     Social History:   Social History   Socioeconomic History  . Marital status: Divorced    Spouse name: Not on file  . Number of children: 6  . Years of education: Not on file  . Highest education level: GED or equivalent  Occupational History  . Occupation: retired  Tobacco Use  . Smoking status: Former Smoker    Packs/day: 3.00    Types: Cigarettes    Quit date: 10/23/1976    Years since quitting: 42.6  . Smokeless tobacco: Never Used  Substance and Sexual Activity  . Alcohol use: No  . Drug use: No  . Sexual activity: Not Currently  Other Topics Concern  . Not on file  Social History Narrative   Pt lives at home alone but has a caregiver come in 7 days a week and talks with her daughter daily.   Social Determinants of Health   Financial Resource Strain: Low Risk   . Difficulty of Paying Living Expenses: Not hard at all  Food Insecurity: No Food Insecurity  . Worried  About Programme researcher, broadcasting/film/video in the Last Year: Never true  . Ran Out of Food in the Last Year: Never true  Transportation Needs: No Transportation Needs  . Lack of Transportation (Medical): No  . Lack of Transportation (Non-Medical): No  Physical Activity: Inactive  . Days of Exercise per Week: 0 days  . Minutes of Exercise per Session: 0 min  Stress: Stress Concern Present  . Feeling of Stress : To some extent  Social Connections: Moderately Isolated  . Frequency of Communication with Friends and Family: More than three times a week  . Frequency of Social Gatherings with Friends and Family: Three times a week  . Attends Religious Services: Never  . Active Member of Clubs or Organizations: No  . Attends Banker Meetings: Never  . Marital Status: Divorced  Catering manager Violence: Not At Risk  . Fear of Current or Ex-Partner: No  . Emotionally Abused: No  . Physically Abused: No  . Sexually Abused: No     Family History:    Family History  Problem Relation Age of Onset  . Hypertension Mother   . CAD Mother   . Hypertension Father   . CAD Father   . Hypertension Sister   . CAD Sister   . Hypertension Brother   . CAD Brother   . Diabetes Brother   . Hypertension Brother   . Diabetes Brother   . COPD Son     Garnet Sierras, LPN was present throughout the entirety of the encounter.  Review  of Systems    General:  No chills, fever, night sweats or weight changes.  Cardiovascular:  No chest pain, orthopnea, palpitations, paroxysmal nocturnal dyspnea. Positive for dyspnea on exertion and edema.  Dermatological: No rash, lesions/masses Respiratory: No cough, dyspnea Urologic: No hematuria, dysuria Abdominal:   No nausea, vomiting, diarrhea, bright red blood per rectum, melena, or hematemesis Neurologic:  No visual changes, wkns, changes in mental status. All other systems reviewed and are otherwise negative except as noted above.  Physical Exam/Data    Vitals:     06/04/19 0756 06/04/19 0819 06/04/19 0907 06/04/19 0913  BP:   (!) 129/107   Pulse:    (!) 115  Resp:      Temp:      TempSrc:      SpO2: 100% 100%    Weight:      Height:        Intake/Output Summary (Last 24 hours) at 06/04/2019 0919 Last data filed at 06/04/2019 0915 Gross per 24 hour  Intake 243 ml  Output --  Net 243 ml   Filed Weights   06/03/19 1112 06/03/19 1745  Weight: 88.5 kg 87.5 kg   Body mass index is 31.14 kg/m.   General: Pleasant elderly Caucasian female appearing in NAD Psych: Normal affect. Neuro: Alert and oriented X 3. Moves all extremities spontaneously. HEENT: Normal  Neck: Supple without bruits or JVD. Lungs:  Resp regular and unlabored, mild rales along bases bilaterally Heart: Irregularly irregular, tachycardiac. No s3, s4, or murmurs. Abdomen: Soft, non-tender, non-distended, BS + x 4.  Extremities: No clubbing or cyanosis. Trace lower extremity edema. DP/PT/Radials 2+ and equal bilaterally.   EKG:  The EKG was personally reviewed and demonstrates: Atrial fibrillation with RVR, HR 122 with LVH and nonspecific ST abnormalities along lateral leads.   Telemetry:  Telemetry was personally reviewed and demonstrates: Atrial fibrillation, HR in 100's to 130's.    Labs/Studies     Relevant CV Studies:  Echocardiogram: 04/2019 IMPRESSIONS    1. Left ventricular ejection fraction, by visual estimation, is 60 to  65%. The left ventricle has normal function. There is moderately increased  left ventricular hypertrophy.  2. The left ventricle has no regional wall motion abnormalities.  3. Moderate mitral annular calcification. Probably mild mitral stenosis  (mean gradient 4 mmHg).  4. The tricuspid valve is grossly normal.  5. The tricuspid valve is grossly normal. Tricuspid valve regurgitation  is moderate.  6. The aortic valve (porcine bioprosthesis) was not well visualized.  Aortic valve regurgitation is not visualized. No evidence of  aortic valve  sclerosis or stenosis.  7. Aortic dilatation noted.  8. There is mild dilatation of the aortic root.  9. Severely elevated pulmonary artery systolic pressure.  10. The pulmonic valve was grossly normal. Pulmonic valve regurgitation is  mild.  11. Pulmonic regurgitation is mild.  12. Global right ventricle has normal systolic function.The right  ventricular size is normal. Mildly increased right ventricular wall  thickness.  13. Right atrial size was moderately dilated.  14. The mitral valve is degenerative. Mild mitral valve regurgitation.  15. Left atrial size was severely dilated.  16. Left ventricular diastolic parameters are consistent with Grade II  diastolic dysfunction (pseudonormalization).  17. Elevated left ventricular end-diastolic pressure.  18. Pulmonary hypertension is severe.  19. The inferior vena cava is dilated in size with >50% respiratory  variability, suggesting right atrial pressure of 8 mmHg.   Laboratory Data:  Chemistry Recent Labs  Lab 06/03/19  1127 06/04/19 0417  NA 136 139  K 4.1 4.0  CL 93* 96*  CO2 30 29  GLUCOSE 133* 117*  BUN 41* 42*  CREATININE 1.60* 1.61*  CALCIUM 8.9 9.1  GFRNONAA 29* 28*  GFRAA 33* 33*  ANIONGAP 13 14    No results for input(s): PROT, ALBUMIN, AST, ALT, ALKPHOS, BILITOT in the last 168 hours. Hematology Recent Labs  Lab 06/03/19 1127  WBC 8.0  RBC 4.67  HGB 13.7  HCT 43.4  MCV 92.9  MCH 29.3  MCHC 31.6  RDW 14.7  PLT 234   Cardiac EnzymesNo results for input(s): TROPONINI in the last 168 hours. No results for input(s): TROPIPOC in the last 168 hours.  BNP Recent Labs  Lab 06/03/19 1127  BNP 1,086.0*    DDimer No results for input(s): DDIMER in the last 168 hours.  Radiology/Studies:  DG Chest Port 1 View  Result Date: 06/03/2019 CLINICAL DATA:  Shortness of breath EXAM: PORTABLE CHEST 1 VIEW COMPARISON:  06/01/2019 FINDINGS: Median sternotomy and aortic valve replacement. Stable  cardiomegaly. Atherosclerotic calcification of the aortic knob. Pulmonary vascular congestion. Small right pleural effusion with persistent bibasilar opacities. No pneumothorax. Degenerative changes of the shoulders. IMPRESSION: 1. Stable cardiomegaly with pulmonary vascular congestion and small right pleural effusion. 2. Right greater than left bibasilar opacities may reflect a combination of atelectasis and/or pneumonia. Electronically Signed   By: Duanne Guess D.O.   On: 06/03/2019 11:48     Assessment & Plan    1. Atrial Fibrillation with RVR - she has a known history of paroxysmal atrial fibrillation but her last known episode was in 2017. She has overall been unaware of her arrhythmia and denies any palpitations. - electrolytes and Mg WNL. TSH 1.948. Recent echo showed a preserved EF with known Pulmonary HTN.  - she received IV Lopressor and one dose of IV Amiodarone  while in the ED. Rates remain elevated in the 110's to 130's at rest. Reviewed with [name] and will start IV Amiodarone with a goal of converting back to NSR. If she does not, could consider repeat DCCV as this has been successful in the past. PTA Toprol-XL has already been increased from  daily to  BID. PTA Lisinopril currently held to allow for further titration of AV nodal blocking agents.  - she reports good compliance with Eliquis and has not missed any recent doses. Was on  BID prior to admission but reduced to 2.5mg  BID on admission given elevated renal function and her age.   2. Acute on Chronic Diastolic CHF - BNP elevated to 1086 on admission and CXR showed stable cardiomegaly with pulmonary vascular congestion and a small right pleural effusion.  - she has been started on IV Lasix  BID. I&O's not fully recorded. She reports a baseline weight of 190 lbs. At 195 lbs on admission, at 192 lbs today. She still has rales on examination, therefore will continue with IV Lasix today.   3. History  of Prosthetic Aortic Valve Replacement - s/p AVR in 2010 and re-do surgery in 2014 by review of Care Everywhere. Recent echo in 04/2019 showed no evidence of stenosis.    4. CAD - s/p DES to RCA and staged PCI of LAD and LCx in 2015. She denies any recent chest pain. HS Troponin values have been flat at 27 and 28. - continue BB. Not on ASA given the need for anticoagulation. She has been intolerant to statins in the past.   5.  Pulmonary HTN - severe by most recent echocardiogram. Suspect this is secondary to her oxygen-dependent COPD.   6. Acute on Chronic Stage 3 CKD - baseline creatinine 0.9 - 1.0. Elevated to 1.60 on admission. Follow with daily BMET.    For questions or updates, please contact [facility] Please consult [email or web address] for contact info under Cardiology/STEMI.  Signed, Ellsworth Lennox, PA-C 06/04/2019, 9:19 AM Pager: [phone]  The patient was seen and examined, and I agree with the history, physical exam, assessment and plan as documented above, with modifications made above and as noted below. I have also personally reviewed all relevant documentation, old records, labs, and both radiographic and cardiovascular studies. I have also independently interpreted old and new ECG's.  Briefly, this is an 86 year old woman with a past medical history of chronic diastolic heart failure, aortic valve replacement in 2010 and 2014, coronary disease with multivessel PCI, carotid artery stenosis status post left carotid endarterectomy, and paroxysmal atrial fibrillation status post direct-current cardioversion in 2017.  We have been asked to evaluate her for rapid atrial fibrillation and CHF.  She was most recently evaluated by her primary cardiologist in January and reported being stable with respect to chronic exertional dyspnea.  She has oxygen dependent COPD.  An echocardiogram was obtained which demonstrated normal LV systolic function with a normally functioning  prosthetic aortic valve.  There was severe left atrial dilatation.  She presented to the ED on 06/03/2019 for worsening shortness of breath and was found to be in rapid atrial fibrillation.  She was hypoxemic.  She had put on about 10 pounds from her baseline weight.  BNP was elevated at 1086 with nonspecifically elevated troponins.  Chest x-ray showed CHF.  ECG showed rapid atrial fibrillation.  I spoke with the ED physician and instructed him to give 40 mg of IV Lasix along with IV amiodarone bolus.  She is already feeling better.  She is sitting up in the chair comfortably.  Heart rates remain elevated overnight.  We have started an IV amiodarone drip and given another 150 mg IV bolus and heart rates are currently in the 90-low 100 bpm range.  She told me that during her last cardioversion in 2017 had a difficult time waking her up.  Would continue IV Lasix 40 mg twice daily for now.  We will closely monitor renal function.  Given severe left atrial enlargement and oxygen dependent COPD, I suspect we would have a difficult time of successfully maintaining sinus rhythm if we were to proceed with DCCV.  She had been taking Toprol-XL 50 mg daily which has been increased to 50 mg twice daily.  She remains on Eliquis 2.5 mg twice daily for systemic anticoagulation.    Prentice Docker, MD, [facility]  06/04/2019 11:20 AM

## 2019-06-05 DIAGNOSIS — I272 Pulmonary hypertension, unspecified: Secondary | ICD-10-CM

## 2019-06-05 DIAGNOSIS — N183 Chronic kidney disease, stage 3 unspecified: Secondary | ICD-10-CM

## 2019-06-05 DIAGNOSIS — I25118 Atherosclerotic heart disease of native coronary artery with other forms of angina pectoris: Secondary | ICD-10-CM

## 2019-06-05 DIAGNOSIS — J9621 Acute and chronic respiratory failure with hypoxia: Secondary | ICD-10-CM

## 2019-06-05 DIAGNOSIS — I5033 Acute on chronic diastolic (congestive) heart failure: Secondary | ICD-10-CM

## 2019-06-05 DIAGNOSIS — N179 Acute kidney failure, unspecified: Secondary | ICD-10-CM

## 2019-06-05 DIAGNOSIS — Z952 Presence of prosthetic heart valve: Secondary | ICD-10-CM

## 2019-06-05 DIAGNOSIS — I4891 Unspecified atrial fibrillation: Secondary | ICD-10-CM

## 2019-06-05 DIAGNOSIS — N1832 Chronic kidney disease, stage 3b: Secondary | ICD-10-CM

## 2019-06-05 LAB — BASIC METABOLIC PANEL
Anion gap: 13 (ref 5–15)
BUN: 37 mg/dL — ABNORMAL HIGH (ref 8–23)
CO2: 29 mmol/L (ref 22–32)
Calcium: 8.7 mg/dL — ABNORMAL LOW (ref 8.9–10.3)
Chloride: 94 mmol/L — ABNORMAL LOW (ref 98–111)
Creatinine, Ser: 1.39 mg/dL — ABNORMAL HIGH (ref 0.44–1.00)
GFR calc Af Amer: 39 mL/min — ABNORMAL LOW (ref >60)
GFR calc non Af Amer: 34 mL/min — ABNORMAL LOW (ref >60)
Glucose, Bld: 134 mg/dL — ABNORMAL HIGH (ref 70–99)
Potassium: 4 mmol/L (ref 3.5–5.1)
Sodium: 136 mmol/L (ref 135–145)

## 2019-06-05 MED ORDER — FUROSEMIDE 10 MG/ML IJ SOLN
40.0000 mg | Freq: Every day | INTRAMUSCULAR | Status: DC
  Administered 2019-06-06 – 2019-06-08 (×3): 40 mg via INTRAVENOUS
  Filled 2019-06-05 (×3): qty 4

## 2019-06-05 MED ORDER — APIXABAN 5 MG PO TABS
5.0000 mg | ORAL_TABLET | Freq: Two times a day (BID) | ORAL | Status: DC
  Administered 2019-06-05 – 2019-06-11 (×13): 5 mg via ORAL
  Filled 2019-06-05 (×12): qty 1

## 2019-06-05 NOTE — Progress Notes (Addendum)
Progress Note  Patient Name: Julie Burns Date of Encounter: 06/05/2019  Primary Cardiologist: Rollene Rotunda, MD   Subjective   Breathing improved at rest but still having significant dyspnea with minimal ambulation, such as walking from the bed to the bedside commode. No chest pain or palpitations.   Inpatient Medications    Scheduled Meds: . apixaban  2.5 mg Oral BID  . arformoterol  15 mcg Nebulization BID  . budesonide (PULMICORT) nebulizer solution  0.5 mg Nebulization BID  . Chlorhexidine Gluconate Cloth  6 each Topical Daily  . cholecalciferol  2,000 Units Oral Daily  . [START ON 06/06/2019] furosemide  40 mg Intravenous Daily  . levothyroxine  112.5 mcg Oral Once per day on Mon Thu  . levothyroxine  75 mcg Oral Once per day on Sun Tue Wed Fri Sat  . mouth rinse  15 mL Mouth Rinse BID  . metoprolol succinate  50 mg Oral BID  . multivitamin-lutein  1 capsule Oral BID  . polyethylene glycol  17 g Oral Daily  . sodium chloride flush  3 mL Intravenous Q12H  . umeclidinium bromide  1 puff Inhalation Daily   Continuous Infusions: . sodium chloride    . amiodarone 30 mg/hr (06/05/19 0032)   PRN Meds: sodium chloride, acetaminophen, benzonatate, levalbuterol, melatonin, nitroGLYCERIN, ondansetron (ZOFRAN) IV, ondansetron, sodium chloride flush, traMADol   Vital Signs    Vitals:   06/05/19 0400 06/05/19 0626 06/05/19 0759 06/05/19 0805  BP: 116/78     Pulse: 78     Resp: 20     Temp: (!) 97.4 F (36.3 C)     TempSrc: Oral     SpO2: 94% 99% 95% 95%  Weight:      Height:        Intake/Output Summary (Last 24 hours) at 06/05/2019 0809 Last data filed at 06/04/2019 1900 Gross per 24 hour  Intake 235.39 ml  Output 1 ml  Net 234.39 ml    Last 3 Weights 06/03/2019 06/03/2019 05/20/2019  Weight (lbs) 192 lb 14.4 oz 195 lb 196 lb 12.8 oz  Weight (kg) 87.5 kg 88.451 kg 89.268 kg      Telemetry    Atrial fibrillation, HR in 90's to 110's. - Personally Reviewed  ECG      No new tracings.   Physical Exam   General: Well developed, well nourished, female appearing in no acute distress. Head: Normocephalic, atraumatic.  Neck: Supple without bruits, JVD not elevated. Lungs:  Resp regular and unlabored, decreased along bases but improved.  Heart: Irregularly irregular, S1, S2, no S3, S4, or murmur; no rub. Abdomen: Soft, non-tender, non-distended with normoactive bowel sounds. No hepatomegaly. No rebound/guarding. No obvious abdominal masses. Extremities: No clubbing or cyanosis, 1+ pitting edema bilaterally. Distal pedal pulses are 2+ bilaterally. Neuro: Alert and oriented X 3. Moves all extremities spontaneously. Psych: Normal affect.  Labs    Chemistry Recent Labs  Lab 06/03/19 1127 06/04/19 0417 06/05/19 0414  NA 136 139 136  K 4.1 4.0 4.0  CL 93* 96* 94*  CO2 30 29 29   GLUCOSE 133* 117* 134*  BUN 41* 42* 37*  CREATININE 1.60* 1.61* 1.39*  CALCIUM 8.9 9.1 8.7*  GFRNONAA 29* 28* 34*  GFRAA 33* 33* 39*  ANIONGAP 13 14 13      Hematology Recent Labs  Lab 06/03/19 1127  WBC 8.0  RBC 4.67  HGB 13.7  HCT 43.4  MCV 92.9  MCH 29.3  MCHC 31.6  RDW 14.7  PLT 234    Cardiac EnzymesNo results for input(s): TROPONINI in the last 168 hours. No results for input(s): TROPIPOC in the last 168 hours.   BNP Recent Labs  Lab 06/03/19 1127  BNP 1,086.0*     DDimer No results for input(s): DDIMER in the last 168 hours.   Radiology    DG Chest Port 1 View  Result Date: 06/03/2019 CLINICAL DATA:  Shortness of breath EXAM: PORTABLE CHEST 1 VIEW COMPARISON:  06/01/2019 FINDINGS: Median sternotomy and aortic valve replacement. Stable cardiomegaly. Atherosclerotic calcification of the aortic knob. Pulmonary vascular congestion. Small right pleural effusion with persistent bibasilar opacities. No pneumothorax. Degenerative changes of the shoulders. IMPRESSION: 1. Stable cardiomegaly with pulmonary vascular congestion and small right pleural  effusion. 2. Right greater than left bibasilar opacities may reflect a combination of atelectasis and/or pneumonia. Electronically Signed   By: Davina Poke D.O.   On: 06/03/2019 11:48    Cardiac Studies   Echocardiogram: 04/2019 IMPRESSIONS    1. Left ventricular ejection fraction, by visual estimation, is 60 to  65%. The left ventricle has normal function. There is moderately increased  left ventricular hypertrophy.  2. The left ventricle has no regional wall motion abnormalities.  3. Moderate mitral annular calcification. Probably mild mitral stenosis  (mean gradient 4 mmHg).  4. The tricuspid valve is grossly normal.  5. The tricuspid valve is grossly normal. Tricuspid valve regurgitation  is moderate.  6. The aortic valve (porcine bioprosthesis) was not well visualized.  Aortic valve regurgitation is not visualized. No evidence of aortic valve  sclerosis or stenosis.  7. Aortic dilatation noted.  8. There is mild dilatation of the aortic root.  9. Severely elevated pulmonary artery systolic pressure.  10. The pulmonic valve was grossly normal. Pulmonic valve regurgitation is  mild.  11. Pulmonic regurgitation is mild.  12. Global right ventricle has normal systolic function.The right  ventricular size is normal. Mildly increased right ventricular wall  thickness.  13. Right atrial size was moderately dilated.  14. The mitral valve is degenerative. Mild mitral valve regurgitation.  15. Left atrial size was severely dilated.  16. Left ventricular diastolic parameters are consistent with Grade II  diastolic dysfunction (pseudonormalization).  17. Elevated left ventricular end-diastolic pressure.  18. Pulmonary hypertension is severe.  19. The inferior vena cava is dilated in size with >50% respiratory  variability, suggesting right atrial pressure of 8 mmHg.   Patient Profile     86 y.o. female w/ PMH of chronic diastolic CHF, s/p aortic valve replacement in  2010 and 2014, CAD (s/p DES to RCA and staged PCI of LAD and LCx in 2015), paroxysmal atrial fibrillation (s/p DCCV in 2017), carotid artery stenosis (s/p L CEA), HTN, HLD, COPD and severe pulmonary HTN who is currently admitted for an acute CHF exacerbation and atrial fibrillation with RVR.   Assessment & Plan    1. Atrial Fibrillation with RVR - she has a known history of paroxysmal atrial fibrillation but her last known episode was in 2017. She has overall been unaware of her arrhythmia and denies any palpitations. - electrolytes and Mg WNL. TSH 1.948. Recent echo showed a preserved EF with known Pulmonary HTN.  - She has been started on IV Amiodarone (on PO Amiodarone 200mg  daily PTA) and rates remain in the 100's to 110's at rest, increasing into the 130's with activity and she is symptomatic with this. SBP is in the 90's and does not allow for further titration of AV  nodal blocking agents and Toprol-XL has already been increased from 50mg  daily to 50mg  BID. Would continue with IV Amiodarone for now. If rates do not improve or she does not convert back to NSR over the weekend, would likely need to attempt DCCV on Monday. She does have severe LA dilation so unsure how long she would maintain NSR if this is achieved.  - she reports good compliance with Eliquis and has not missed any recent doses. Was on 5mg  BID prior to admission but reduced to 2.5mg  BID on admission given elevated renal function at that time. Needs to go back to 5mg  BID now as the only indication for reduced dosing at this time is age.   2. Acute on Chronic Diastolic CHF - BNP elevated to 1086 on admission and CXR showed stable cardiomegaly with pulmonary vascular congestion and a small right pleural effusion.  - she has been receiving IV Lasix 40mg  BID and neither I&O's nor daily weighs have been recorded. I spoke to her nurse about obtaining a standing weight later today. Creatinine continues to improve with diuresis (1.61 -->  1.39), therefore will continue with IV Lasix given volume overload on exam. If creatinine starts to worsen, would switch back to PO Lasix 40mg  BID.   3. History of Prosthetic Aortic Valve Replacement - s/p AVR in 2010 and re-do surgery in 2014 by review of Care Everywhere. Recent echo in 04/2019 showed no evidence of stenosis.    4. CAD - s/p DES to RCA and staged PCI of LAD and LCx in 2015. She denies any recent anginal symptoms and HS Troponin values have been flat at 27 and 28. - continue BB. She is not on ASA due to being on Eliquis and is not on statin therapy due to intolerances in the past.   5. Pulmonary HTN - felt to be secondary to O2-dependent COPD. Pulmonary HTN severe by most recent echocardiogram. Remains on 3L [place] at baseline.   6. Acute on Chronic Stage 3 CKD - baseline creatinine 0.9 - 1.0. Elevated to 1.60 on admission and improved to 1.39 today with diuresis.    For questions or updates, please contact [facility] Please consult [email or web address] for contact info under Cardiology/STEMI.   Signed, , PA-C 8:09 AM 06/05/2019 Pager: [phone]  The patient was seen and examined, and I agree with the history, physical exam, assessment and plan as documented above, with modifications made above and as noted below.  Heart rates are reasonably controlled at rest but quickly accelerate with exertion.  Earlier this morning she tried walking to the bathroom and became profoundly short of breath.  She remains on IV amiodarone.    Given severe left atrial enlargement and oxygen dependent COPD, I suspect we would have a difficult time of successfully maintaining sinus rhythm if we were to proceed with DCCV but if she does not convert to sinus rhythm over the weekend, we may need to make an attempt at this on Monday. Toprol-XL 50 mg has already been increased to twice daily dosing and she remains on Eliquis 2.5 mg twice daily for systemic  anticoagulation.  Otherwise, continue IV Lasix 40 mg twice daily.  05/2019, MD, [facility]  06/05/2019 10:11 AM

## 2019-06-05 NOTE — Care Management Important Message (Signed)
Important Message  Patient Details  Name: Julie Burns MRN: [id number] Date of Birth: [birth year]   Medicare Important Message Given:  Yes     Corey Harold 06/05/2019, 2:41 PM

## 2019-06-05 NOTE — Progress Notes (Signed)
PROGRESS NOTE    Julie Burns  LKG:[id number] DOB: [birth year] DOA: 06/03/2019 PCP: Kirstie Peri, MD     Brief Narrative:  86 y.o. female with PMH significant for a. Fib, chronic resp failure, diastolic HF, HTN, HLD, hypothyroidism, anxiety, type 2 diabetes and CKD stage 3B; who presented to ED complaining of general malaise, fatigue and worsening breathing. Patient expressed SOB has been ongoing for the last 4 days or so, she was seen at [facility] ED and kept overnight, without significant changes in her symptoms or clear explanation of what was causing her to be more SOB. Despite some extra lasix at home she has continue experiencing worsening in her breathing and as found hypoxic by her caregiver.  No frank CP, but expressed feeling not well, patient denies nausea, vomiting, dysuria, hematuria, focal neurologic deficits, headaches or any other complaints.  In ED was found to be in a. Fib with RVR, elevated BNP and CXR demonstrating vascular congestion and interstitial edema; IV lopressor and IV lasix given in ED, patient oxygen also bump to 4L to maintain O2 sat above 90%. TRH consulted to place patient in the hospital for further evaluation and management of her a. Fib with RVR and CHF exacerbation. Cardiology service was consulted.    Assessment & Plan: 1-Acute on chronic diastolic heart failure -In the setting of A. fib with RVR -Remains in a stepdown -Continue adjusted dose of metoprolol and IV amiodarone.  If patient fails to control her rate over the weekend might ended requiring cardioversion on Monday. -Continue IV Lasix and closely follow renal function electrolytes. -Continue holding ACE inhibitor/ARB in the setting of acute exacerbation and the presence of acute on chronic renal failure. -Follow daily weights, strict I's and O's and low-sodium diet. -Cardiology service is on board and will follow recommendations. -Recent echocardiogram demonstrating preserved ejection fraction,  diastolic dysfunction and signs of pulmonary hypertension.  2-acute on chronic respiratory failure with hypoxia: In the setting of COPD -Appears to be secondary to problem #1 -Pulmonary hypertension and underlying COPD contributing -Continue bronchodilators and nebulizer management -Actively no wheezing and no requiring a steroids. -Follow clinical response to above treatment and wean down oxygen supplementation to baseline as tolerated.  3-A. fib with RVR: Chronic/longstanding paroxysmal atrial fibrillation. -Continue adjusted dose of metoprolol -Loading dose of amiodarone and initiation of amiodarone drip started -Continue Eliquis for secondary prevention -Follow cardiology service recommendations.  4-acute on chronic stage III renal failure -Patient stage IIIb at baseline -Appears to be experiencing worsening in renal function secondary to decreased perfusion in the setting of CHF -Continue to minimize/avoid extra nephrotoxic agents -Continue IV diuresis and follow renal function trend closely; becoming euvolemic.[name]  5-anxiety -Overall stable mood -Continue as needed lorazepam.  6-hypothyroidism -TSH within normal limits -Continue Synthroid  7-chronic pain syndrome -Overall stable -Continue Tylenol and as needed tramadol.  DVT prophylaxis: Eliquis. Code Status: DNR Family Communication: No family at bedside. Disposition Plan: Remains inpatient, still having trouble controlling heart rate and will require the need of IV amiodarone and initiation of amiodarone drip.  Continue Eliquis and adjusted dose of metoprolol.  Continue oxygen supplementation and wean down as tolerated to baseline.  Closely follow renal function and electrolytes.  Consultants:   Cardiology service  Procedures:   See below for x-ray reports.  Antimicrobials:  Anti-infectives (From admission, onward)   None      Subjective: No chest pain, no nausea, no vomiting.  Patient has remained afebrile.   Reports breathing better especially while resting.  Still short of breath with uncontrolled heart rate on minimal exertion.  Requiring amiodarone drip.  Objective: Vitals:   06/05/19 0805 06/05/19 0809 06/05/19 1002 06/05/19 1600  BP:   118/89   Pulse:   91   Resp:      Temp:    97.6 F (36.4 C)  TempSrc:      SpO2: 95% 95%    Weight:      Height:        Intake/Output Summary (Last 24 hours) at 06/05/2019 1900 Last data filed at 06/05/2019 1004 Gross per 24 hour  Intake 243 ml  Output --  Net 243 ml   Filed Weights   06/03/19 1112 06/03/19 1745  Weight: 88.5 kg 87.5 kg    Examination: General exam: Alert, awake, oriented x 3; afebrile and denying chest pain.  Expressed no nausea, no vomiting and feeling better while at rest.  Patient becomes rapidly short of breath and feels unwell with minimal activity.  Heart rate still not controlled. Respiratory system: Decreased breath sounds at the bases, no frank crackles, no wheezing.  No using accessory muscles.  Nursing staff reporting tachypnea with minimal activity. Cardiovascular system: Irregular irregular, no rubs, no gallops, no JVD on exam. Gastrointestinal system: Abdomen is nondistended, soft and nontender. No organomegaly or masses felt. Normal bowel sounds heard. Central nervous system: Alert and oriented. No focal neurological deficits. Extremities: No cyanosis or clubbing; trace edema bilaterally. Skin: No rashes, no petechiae; bruises appreciated on her right forearm from blood draws. Psychiatry: Judgement and insight appear normal. Mood & affect appropriate.    Data Reviewed: I have personally reviewed following labs and imaging studies  CBC: Recent Labs  Lab 06/03/19 1127  WBC 8.0  HGB 13.7  HCT 43.4  MCV 92.9  PLT 234   Basic Metabolic Panel: Recent Labs  Lab 06/03/19 1127 06/04/19 0417 06/05/19 0414  NA 136 139 136  K 4.1 4.0 4.0  CL 93* 96* 94*  CO2 30 29 29   GLUCOSE 133* 117* 134*  BUN 41* 42*  37*  CREATININE 1.60* 1.61* 1.39*  CALCIUM 8.9 9.1 8.7*  MG 2.2  --   --    GFR: Estimated Creatinine Clearance: 31.8 mL/min (A) (by C-G formula based on SCr of 1.39 mg/dL (H)).  Coagulation Profile: Recent Labs  Lab 06/03/19 1127  INR 1.2   Thyroid Function Tests: Recent Labs    06/03/19 1127  TSH 1.948    Recent Results (from the past 240 hour(s))  SARS CORONAVIRUS 2 (TAT 6-24 HRS) Nasopharyngeal Nasopharyngeal Swab     Status: None   Collection Time: 06/03/19 12:58 PM   Specimen: Nasopharyngeal Swab  Result Value Ref Range Status   SARS Coronavirus 2 NEGATIVE NEGATIVE Final    Comment: (NOTE) SARS-CoV-2 target nucleic acids are NOT DETECTED. The SARS-CoV-2 RNA is generally detectable in upper and lower respiratory specimens during the acute phase of infection. Negative results do not preclude SARS-CoV-2 infection, do not rule out co-infections with other pathogens, and should not be used as the sole basis for treatment or other patient management decisions. Negative results must be combined with clinical observations, patient history, and epidemiological information. The expected result is Negative. Fact Sheet for Patients: 06/05/19 Fact Sheet for Healthcare Providers: HairSlick.no This test is not yet approved or cleared by the [employer]irigir.com FDA and  has been authorized for detection and/or diagnosis of SARS-CoV-2 by FDA under an Emergency Use Authorization (EUA). This EUA will remain  in effect (  meaning this test can be used) for the duration of the COVID-19 declaration under Section 56 4(b)(1) of the Act, 21 U.S.C. section 360bbb-3(b)(1), unless the authorization is terminated or revoked sooner. Performed at [facility], [address] [place], [place] [address]   Respiratory Panel by RT PCR (Flu A&B, Covid) - Nasopharyngeal Swab     Status: None   Collection Time: 06/03/19  2:59 PM    Specimen: Nasopharyngeal Swab  Result Value Ref Range Status   SARS Coronavirus 2 by RT PCR NEGATIVE NEGATIVE Final    Comment: (NOTE) SARS-CoV-2 target nucleic acids are NOT DETECTED. The SARS-CoV-2 RNA is generally detectable in upper respiratoy specimens during the acute phase of infection. The lowest concentration of SARS-CoV-2 viral copies this assay can detect is 131 copies/mL. A negative result does not preclude SARS-Cov-2 infection and should not be used as the sole basis for treatment or other patient management decisions. A negative result may occur with  improper specimen collection/handling, submission of specimen other than nasopharyngeal swab, presence of viral mutation(s) within the areas targeted by this assay, and inadequate number of viral copies (<131 copies/mL). A negative result must be combined with clinical observations, patient history, and epidemiological information. The expected result is Negative. Fact Sheet for Patients:  https://www.moore.com/ Fact Sheet for Healthcare Providers:  https://www.young.biz/ This test is not yet ap proved or cleared by the [place] FDA and  has been authorized for detection and/or diagnosis of SARS-CoV-2 by FDA under an Emergency Use Authorization (EUA). This EUA will remain  in effect (meaning this test can be used) for the duration of the COVID-19 declaration under Section 564(b)(1) of the Act, 21 U.S.C. section 360bbb-3(b)(1), unless the authorization is terminated or revoked sooner.    Influenza A by PCR NEGATIVE NEGATIVE Final   Influenza B by PCR NEGATIVE NEGATIVE Final    Comment: (NOTE) The Xpert Xpress SARS-CoV-2/FLU/RSV assay is intended as an aid in  the diagnosis of influenza from Nasopharyngeal swab specimens and  should not be used as a sole basis for treatment. Nasal washings and  aspirates are unacceptable for Xpert Xpress SARS-CoV-2/FLU/RSV  testing. Fact Sheet  for Patients: https://www.moore.com/ Fact Sheet for Healthcare Providers: https://www.young.biz/ This test is not yet approved or cleared by the [place] FDA and  has been authorized for detection and/or diagnosis of SARS-CoV-2 by  FDA under an Emergency Use Authorization (EUA). This EUA will remain  in effect (meaning this test can be used) for the duration of the  Covid-19 declaration under Section 564(b)(1) of the Act, 21  U.S.C. section 360bbb-3(b)(1), unless the authorization is  terminated or revoked. Performed at [facility], [address] [place], [place] [address]   MRSA PCR Screening     Status: None   Collection Time: 06/03/19  5:03 PM   Specimen: Nasal Mucosa; Nasopharyngeal  Result Value Ref Range Status   MRSA by PCR NEGATIVE NEGATIVE Final    Comment:        The GeneXpert MRSA Assay (FDA approved for NASAL specimens only), is one component of a comprehensive MRSA colonization surveillance program. It is not intended to diagnose MRSA infection nor to guide or monitor treatment for MRSA infections. Performed at [facility], [address], [place], [place] [address]      Radiology Studies: No results found.  Scheduled Meds: . apixaban  5 mg Oral BID  . arformoterol  15 mcg Nebulization BID  . budesonide (PULMICORT) nebulizer solution  0.5 mg  Nebulization BID  . Chlorhexidine Gluconate Cloth  6 each Topical Daily  . cholecalciferol  2,000 Units Oral Daily  . [START ON 06/06/2019] furosemide  40 mg Intravenous Daily  . levothyroxine  112.5 mcg Oral Once per day on Mon Thu  . levothyroxine  75 mcg Oral Once per day on Sun Tue Wed Fri Sat  . mouth rinse  15 mL Mouth Rinse BID  . metoprolol succinate  50 mg Oral BID  . multivitamin-lutein  1 capsule Oral BID  . polyethylene glycol  17 g Oral Daily  . sodium chloride flush  3 mL Intravenous Q12H  . umeclidinium bromide  1 puff Inhalation Daily   Continuous  Infusions: . sodium chloride    . amiodarone 30 mg/hr (06/05/19 1238)     LOS: 1 day    Time spent: 35 minutes.  Greater than 50% of this time spent in direct contact with the patient, coordinating care and discussing relevant ongoing clinical issues, including further discussion about atrial fibrillation, uncontrolled breathing especially with exertion and how these is making her short of breath and making her not to feel good.  Despite improvement in her heart rate after initiation of amiodarone drip she continued to have shortness of breath with minimal exertion and her heart rate rapidly accelerated into the 130s 140s with activity.  Patient denies chest pain.  No nausea, no vomiting.  Case discussed with cardiology service.     Barton Dubois, MD [facility] Pager [phone]   06/05/2019, 7:00 PM

## 2019-06-06 LAB — BASIC METABOLIC PANEL
Anion gap: 12 (ref 5–15)
BUN: 35 mg/dL — ABNORMAL HIGH (ref 8–23)
CO2: 31 mmol/L (ref 22–32)
Calcium: 9.2 mg/dL (ref 8.9–10.3)
Chloride: 94 mmol/L — ABNORMAL LOW (ref 98–111)
Creatinine, Ser: 1.19 mg/dL — ABNORMAL HIGH (ref 0.44–1.00)
GFR calc Af Amer: 48 mL/min — ABNORMAL LOW (ref >60)
GFR calc non Af Amer: 41 mL/min — ABNORMAL LOW (ref >60)
Glucose, Bld: 149 mg/dL — ABNORMAL HIGH (ref 70–99)
Potassium: 4 mmol/L (ref 3.5–5.1)
Sodium: 137 mmol/L (ref 135–145)

## 2019-06-06 MED ORDER — DOCUSATE SODIUM 100 MG PO CAPS
100.0000 mg | ORAL_CAPSULE | Freq: Two times a day (BID) | ORAL | Status: DC
  Administered 2019-06-06 – 2019-06-11 (×10): 100 mg via ORAL
  Filled 2019-06-06 (×10): qty 1

## 2019-06-06 MED ORDER — LEVALBUTEROL HCL 0.63 MG/3ML IN NEBU
0.6300 mg | INHALATION_SOLUTION | RESPIRATORY_TRACT | Status: DC | PRN
  Administered 2019-06-07 – 2019-06-11 (×13): 0.63 mg via RESPIRATORY_TRACT
  Filled 2019-06-06 (×13): qty 3

## 2019-06-06 NOTE — Progress Notes (Signed)
PROGRESS NOTE    Julie Burns  CVE:[id number] DOB: 03-07-1930 DOA: 06/03/2019 PCP: Kirstie Peri, MD     Brief Narrative:  86 y.o. female with PMH significant for a. Fib, chronic resp failure, diastolic HF, HTN, HLD, hypothyroidism, anxiety, type 2 diabetes and CKD stage 3B; who presented to ED complaining of general malaise, fatigue and worsening breathing. Patient expressed SOB has been ongoing for the last 4 days or so, she was seen at [facility] ED and kept overnight, without significant changes in her symptoms or clear explanation of what was causing her to be more SOB. Despite some extra lasix at home she has continue experiencing worsening in her breathing and as found hypoxic by her caregiver.  No frank CP, but expressed feeling not well, patient denies nausea, vomiting, dysuria, hematuria, focal neurologic deficits, headaches or any other complaints.  In ED was found to be in a. Fib with RVR, elevated BNP and CXR demonstrating vascular congestion and interstitial edema; IV lopressor and IV lasix given in ED, patient oxygen also bump to 4L to maintain O2 sat above 90%. TRH consulted to place patient in the hospital for further evaluation and management of her a. Fib with RVR and CHF exacerbation. Cardiology service was consulted.    Assessment & Plan: 1-Acute on chronic diastolic heart failure -In the setting of A. fib with RVR -Remains in a stepdown -Continue adjusted dose of metoprolol and IV amiodarone.  If patient continued failing to control her rate over the weekend might ended requiring cardioversion on Monday. -Continue IV Lasix and closely follow renal function electrolytes. -Continue holding ACE inhibitor/ARB in the setting of acute exacerbation and the presence of acute on chronic renal failure. -Continue to follow daily weights, strict I's and O's and low-sodium diet. -Cardiology service is on board and will follow recommendations. -Recent echocardiogram demonstrating  preserved ejection fraction, diastolic dysfunction and signs of pulmonary hypertension.  2-acute on chronic respiratory failure with hypoxia: In the setting of COPD -Appears to be secondary to problem #1 -Pulmonary hypertension and underlying COPD contributing -Continue bronchodilators and nebulizer management -Actively no wheezing and no requiring a steroids. -Follow clinical response to above treatment and wean down oxygen supplementation to baseline as tolerated.  3-A. fib with RVR: Chronic/longstanding paroxysmal atrial fibrillation. -Continue adjusted dose of metoprolol -Loading dose of amiodarone and initiation of amiodarone drip started -Continue Eliquis for secondary prevention -Follow cardiology service recommendations.  4-acute on chronic stage III renal failure -Patient stage IIIb at baseline -Appears to be experiencing worsening in renal function secondary to decreased perfusion in the setting of CHF -Continue to minimize/avoid extra nephrotoxic agents -Continue IV diuresis and follow renal function trend closely; patient is becoming euvolemic.. -Creatinine improved to 1.19  5-anxiety -Overall stable mood -Continue as needed lorazepam.  6-hypothyroidism -TSH within normal limits -Continue Synthroid  7-chronic pain syndrome -Overall stable -Continue Tylenol and as needed tramadol.  8-constipation -continue using miralax -will also add colace BID  DVT prophylaxis: Chronically on Eliquis. Code Status: DNR Family Communication: No family at bedside. Disposition Plan: Remains inpatient, still having trouble controlling heart rate and is requiring the use of IV amiodarone drip.  Continue Eliquis and adjusted dose of metoprolol.  Continue oxygen supplementation and follow renal function and electrolytes.  Consultants:   Cardiology service  Procedures:   See below for x-ray reports.  Antimicrobials:  Anti-infectives (From admission, onward)   None       Subjective: No chest pain, no nausea, no vomiting.  Patient has  remained afebrile.  Reports breathing better especially while resting.  Still short of breath with uncontrolled heart rate on minimal exertion.  Requiring amiodarone drip.  Objective: Vitals:   06/06/19 0600 06/06/19 0630 06/06/19 0754 06/06/19 0800  BP: (!) 123/111 (!) 124/112  121/79  Pulse: (!) 34 (!) 103  70  Resp: (!) 21 20  (!) 21  Temp:      TempSrc:      SpO2: 95% 94% 93% 98%  Weight:      Height:        Intake/Output Summary (Last 24 hours) at 06/06/2019 0821 Last data filed at 06/06/2019 0400 Gross per 24 hour  Intake 834.81 ml  Output --  Net 834.81 ml   Filed Weights   06/03/19 1112 06/03/19 1745 06/06/19 0409  Weight: 88.5 kg 87.5 kg 90.8 kg    Examination: General exam: Alert, awake, oriented x 3; calm and denying chest pain.  No nausea or vomiting today.  Breathing easier.  Still short of breath and feeling unwell with exertion.  Can not feel palpitations. Respiratory system: No frank crackles, no wheezing, decreased breath sounds at the bases.  Positive scattered rhonchi. Cardiovascular system: Irregularly irregular, no rubs, no gallops, no JVD. Gastrointestinal system: Abdomen is nondistended, soft and nontender. No organomegaly or masses felt. Normal bowel sounds heard. Central nervous system: Alert and oriented. No focal neurological deficits. Extremities: No cyanosis or clubbing. Skin: No rashes, no petechiae. Psychiatry: Judgement and insight appear normal. Mood & affect appropriate.   Data Reviewed: I have personally reviewed following labs and imaging studies  CBC: Recent Labs  Lab 06/03/19 1127  WBC 8.0  HGB 13.7  HCT 43.4  MCV 92.9  PLT 234   Basic Metabolic Panel: Recent Labs  Lab 06/03/19 1127 06/04/19 0417 06/05/19 0414 06/06/19 0442  NA 136 139 136 137  K 4.1 4.0 4.0 4.0  CL 93* 96* 94* 94*  CO2 30 29 29 31   GLUCOSE 133* 117* 134* 149*  BUN 41* 42* 37* 35*   CREATININE 1.60* 1.61* 1.39* 1.19*  CALCIUM 8.9 9.1 8.7* 9.2  MG 2.2  --   --   --    GFR: Estimated Creatinine Clearance: 37.8 mL/min (A) (by C-G formula based on SCr of 1.19 mg/dL (H)).  Coagulation Profile: Recent Labs  Lab 06/03/19 1127  INR 1.2   Thyroid Function Tests: Recent Labs    06/03/19 1127  TSH 1.948    Recent Results (from the past 240 hour(s))  SARS CORONAVIRUS 2 (TAT 6-24 HRS) Nasopharyngeal Nasopharyngeal Swab     Status: None   Collection Time: 06/03/19 12:58 PM   Specimen: Nasopharyngeal Swab  Result Value Ref Range Status   SARS Coronavirus 2 NEGATIVE NEGATIVE Final    Comment: (NOTE) SARS-CoV-2 target nucleic acids are NOT DETECTED. The SARS-CoV-2 RNA is generally detectable in upper and lower respiratory specimens during the acute phase of infection. Negative results do not preclude SARS-CoV-2 infection, do not rule out co-infections with other pathogens, and should not be used as the sole basis for treatment or other patient management decisions. Negative results must be combined with clinical observations, patient history, and epidemiological information. The expected result is Negative. Fact Sheet for Patients: 06/05/19 Fact Sheet for Healthcare Providers: HairSlick.no This test is not yet approved or cleared by the [place] FDA and  has been authorized for detection and/or diagnosis of SARS-CoV-2 by FDA under an Emergency Use Authorization (EUA). This EUA will remain  in effect (meaning  this test can be used) for the duration of the COVID-19 declaration under Section 56 4(b)(1) of the Act, 21 U.S.C. section 360bbb-3(b)(1), unless the authorization is terminated or revoked sooner. Performed at [facility], [address] [place], [place] [address]   Respiratory Panel by RT PCR (Flu A&B, Covid) - Nasopharyngeal Swab     Status: None   Collection Time: 06/03/19  2:59  PM   Specimen: Nasopharyngeal Swab  Result Value Ref Range Status   SARS Coronavirus 2 by RT PCR NEGATIVE NEGATIVE Final    Comment: (NOTE) SARS-CoV-2 target nucleic acids are NOT DETECTED. The SARS-CoV-2 RNA is generally detectable in upper respiratoy specimens during the acute phase of infection. The lowest concentration of SARS-CoV-2 viral copies this assay can detect is 131 copies/mL. A negative result does not preclude SARS-Cov-2 infection and should not be used as the sole basis for treatment or other patient management decisions. A negative result may occur with  improper specimen collection/handling, submission of specimen other than nasopharyngeal swab, presence of viral mutation(s) within the areas targeted by this assay, and inadequate number of viral copies (<131 copies/mL). A negative result must be combined with clinical observations, patient history, and epidemiological information. The expected result is Negative. Fact Sheet for Patients:  https://www.moore.com/ Fact Sheet for Healthcare Providers:  https://www.young.biz/ This test is not yet ap proved or cleared by the [place] FDA and  has been authorized for detection and/or diagnosis of SARS-CoV-2 by FDA under an Emergency Use Authorization (EUA). This EUA will remain  in effect (meaning this test can be used) for the duration of the COVID-19 declaration under Section 564(b)(1) of the Act, 21 U.S.C. section 360bbb-3(b)(1), unless the authorization is terminated or revoked sooner.    Influenza A by PCR NEGATIVE NEGATIVE Final   Influenza B by PCR NEGATIVE NEGATIVE Final    Comment: (NOTE) The Xpert Xpress SARS-CoV-2/FLU/RSV assay is intended as an aid in  the diagnosis of influenza from Nasopharyngeal swab specimens and  should not be used as a sole basis for treatment. Nasal washings and  aspirates are unacceptable for Xpert Xpress SARS-CoV-2/FLU/RSV  testing. Fact  Sheet for Patients: https://www.moore.com/ Fact Sheet for Healthcare Providers: https://www.young.biz/ This test is not yet approved or cleared by the [place] FDA and  has been authorized for detection and/or diagnosis of SARS-CoV-2 by  FDA under an Emergency Use Authorization (EUA). This EUA will remain  in effect (meaning this test can be used) for the duration of the  Covid-19 declaration under Section 564(b)(1) of the Act, 21  U.S.C. section 360bbb-3(b)(1), unless the authorization is  terminated or revoked. Performed at [facility], [address] [place], [place] [address]   MRSA PCR Screening     Status: None   Collection Time: 06/03/19  5:03 PM   Specimen: Nasal Mucosa; Nasopharyngeal  Result Value Ref Range Status   MRSA by PCR NEGATIVE NEGATIVE Final    Comment:        The GeneXpert MRSA Assay (FDA approved for NASAL specimens only), is one component of a comprehensive MRSA colonization surveillance program. It is not intended to diagnose MRSA infection nor to guide or monitor treatment for MRSA infections. Performed at [facility], [address], [place], [place] [address]      Radiology Studies: No results found.  Scheduled Meds: . apixaban  5 mg Oral BID  . arformoterol  15 mcg Nebulization BID  . budesonide (PULMICORT) nebulizer solution  0.5 mg Nebulization  BID  . Chlorhexidine Gluconate Cloth  6 each Topical Daily  . cholecalciferol  2,000 Units Oral Daily  . furosemide  40 mg Intravenous Daily  . levothyroxine  112.5 mcg Oral Once per day on Mon Thu  . levothyroxine  75 mcg Oral Once per day on Sun Tue Wed Fri Sat  . mouth rinse  15 mL Mouth Rinse BID  . metoprolol succinate  50 mg Oral BID  . multivitamin-lutein  1 capsule Oral BID  . polyethylene glycol  17 g Oral Daily  . sodium chloride flush  3 mL Intravenous Q12H  . umeclidinium bromide  1 puff Inhalation Daily   Continuous Infusions: . sodium  chloride    . amiodarone 30 mg/hr (06/06/19 0400)     LOS: 2 days    Time spent: 35 minutes.  Patient currently has remained regular and difficult to control especially with activity; jumping from 60-80 7-1 30s range and making patient short of breath and feeling unwell.  Overall breathing is a stable and she is back to her baseline oxygen supplementation (while resting).  Nursing staff has been updated on plan of care.  Remain in the stepdown while amiodarone drip will follow cardiology further recommendations (with most likely  cardioversion on Monday if needed).     Barton Dubois, MD [facility] Pager [phone]   06/06/2019, 8:21 AM

## 2019-06-07 LAB — BASIC METABOLIC PANEL
Anion gap: 12 (ref 5–15)
BUN: 37 mg/dL — ABNORMAL HIGH (ref 8–23)
CO2: 29 mmol/L (ref 22–32)
Calcium: 9.2 mg/dL (ref 8.9–10.3)
Chloride: 94 mmol/L — ABNORMAL LOW (ref 98–111)
Creatinine, Ser: 1.2 mg/dL — ABNORMAL HIGH (ref 0.44–1.00)
GFR calc Af Amer: 47 mL/min — ABNORMAL LOW (ref >60)
GFR calc non Af Amer: 41 mL/min — ABNORMAL LOW (ref >60)
Glucose, Bld: 134 mg/dL — ABNORMAL HIGH (ref 70–99)
Potassium: 4.6 mmol/L (ref 3.5–5.1)
Sodium: 135 mmol/L (ref 135–145)

## 2019-06-07 MED ORDER — BISACODYL 10 MG RE SUPP
10.0000 mg | Freq: Once | RECTAL | Status: AC
  Administered 2019-06-07: 10 mg via RECTAL
  Filled 2019-06-07: qty 1

## 2019-06-07 MED ORDER — LORAZEPAM 0.5 MG PO TABS
0.5000 mg | ORAL_TABLET | Freq: Two times a day (BID) | ORAL | Status: DC | PRN
  Administered 2019-06-07 – 2019-06-11 (×8): 0.5 mg via ORAL
  Filled 2019-06-07 (×8): qty 1

## 2019-06-07 MED ORDER — BISACODYL 10 MG RE SUPP: 10.0000 mg | Freq: Every day | RECTAL | Status: DC | PRN

## 2019-06-07 NOTE — Progress Notes (Signed)
PROGRESS NOTE    Julie Burns  IEP:[id number] DOB: 02-05-1931 DOA: 06/03/2019 PCP: Monico Blitz, MD     Brief Narrative:  86 y.o. female with PMH significant for a. Fib, chronic resp failure, diastolic HF, HTN, HLD, hypothyroidism, anxiety, type 2 diabetes and CKD stage 3B; who presented to ED complaining of general malaise, fatigue and worsening breathing. Patient expressed SOB has been ongoing for the last 4 days or so, she was seen at [facility] ED and kept overnight, without significant changes in her symptoms or clear explanation of what was causing her to be more SOB. Despite some extra lasix at home she has continue experiencing worsening in her breathing and as found hypoxic by her caregiver.  No frank CP, but expressed feeling not well, patient denies nausea, vomiting, dysuria, hematuria, focal neurologic deficits, headaches or any other complaints.  In ED was found to be in a. Fib with RVR, elevated BNP and CXR demonstrating vascular congestion and interstitial edema; IV lopressor and IV lasix given in ED, patient oxygen also bump to 4L to maintain O2 sat above 90%. TRH consulted to place patient in the hospital for further evaluation and management of her a. Fib with RVR and CHF exacerbation. Cardiology service was consulted.    Assessment & Plan: 1-Acute on chronic diastolic heart failure -In the setting of A. fib with RVR -Remains in a stepdown -Continue adjusted dose of metoprolol and IV amiodarone.  If patient continued failing to control her rate over the weekend might ended requiring cardioversion on Monday. -Continue IV Lasix and closely follow renal function electrolytes. -Continue holding ACE inhibitor/ARB in the setting of acute exacerbation and the presence of acute on chronic renal failure. -Continue to follow daily weights, strict I's and O's and low-sodium diet. -Cardiology service is on board and will follow recommendations. -Recent echocardiogram demonstrating  preserved ejection fraction, diastolic dysfunction and signs of pulmonary hypertension.  2-acute on chronic respiratory failure with hypoxia: In the setting of COPD -Appears to be secondary to problem #1 -Pulmonary hypertension and underlying COPD contributing -Continue bronchodilators and nebulizer management -Actively no wheezing and no requiring a steroids. -Follow clinical response to above treatment and wean down oxygen supplementation to baseline as tolerated.  3-A. fib with RVR: Chronic/longstanding paroxysmal atrial fibrillation. -Continue adjusted dose of metoprolol -Loading dose of amiodarone and initiation of amiodarone drip started -Continue Eliquis for secondary prevention -Follow cardiology service recommendations.  4-acute on chronic stage III renal failure -Patient stage IIIb at baseline -Appears to be experiencing worsening in renal function secondary to decreased perfusion in the setting of CHF -Continue to minimize/avoid extra nephrotoxic agents -Continue IV diuresis and follow renal function trend closely; patient is becoming euvolemic.. -Creatinine improved to 1.19  5-anxiety -Overall stable mood -Continue as needed lorazepam.  6-hypothyroidism -TSH within normal limits -Continue Synthroid  7-chronic pain syndrome -Overall stable -Continue Tylenol and as needed tramadol.  8-constipation -continue using miralax and Colace. -will also add as needed Dulcolax suppository.  DVT prophylaxis: Chronically on Eliquis. Code Status: DNR Family Communication: No family at bedside. Disposition Plan: Remains inpatient, still having trouble controlling heart rate and is requiring the use of IV amiodarone drip.  Continue Eliquis and adjusted dose of metoprolol.  Continue oxygen supplementation and follow renal function and electrolytes.  Consultants:   Cardiology service  Procedures:   See below for x-ray reports.  Antimicrobials:  Anti-infectives (From  admission, onward)   None      Subjective: Complaining of constipation and feeling sick  in her stomach.  Still short of breath on exertion and with difficult to control heart rate requiring amiodarone drip.  Objective: Vitals:   06/07/19 0831 06/07/19 0900 06/07/19 1000 06/07/19 1045  BP: (!) 123/97 (!) 122/93    Pulse: 62 (!) 33 85 80  Resp: (!) 24 (!) 26 (!) 25 (!) 22  Temp:      TempSrc:      SpO2: 99%  97% 96%  Weight:      Height:        Intake/Output Summary (Last 24 hours) at 06/07/2019 1136 Last data filed at 06/07/2019 1048 Gross per 24 hour  Intake 1102.66 ml  Output 1 ml  Net 1101.66 ml   Filed Weights   06/03/19 1745 06/06/19 0409 06/07/19 0500  Weight: 87.5 kg 90.8 kg 91.1 kg    Examination: General exam: Alert, awake, oriented x 3; no chest pain, no nausea or vomiting.  Reports breathing continued to improve.  Still short of breath on exertion and having difficult to control heart rate.  At rest patient denies palpitations.  Still requiring amiodarone drip along with adjusted dose of beta-blocker.  Reports constipation and feeling sick in her stomach. Respiratory system: No wheezing, no frank crackles; decreased breath sounds at the bases.  No using accessory muscles at rest. Cardiovascular system: Irregularly irregular, no rubs, no gallops; no JVD on exam. Gastrointestinal system: Abdomen is nondistended, soft and nontender. No organomegaly or masses felt. Normal bowel sounds heard. Central nervous system: Alert and oriented. No focal neurological deficits. Extremities: No cyanosis or clubbing. Skin: No rashes, no petechiae. Psychiatry: Judgement and insight appear normal. Mood & affect appropriate.    Data Reviewed: I have personally reviewed following labs and imaging studies  CBC: Recent Labs  Lab 06/03/19 1127  WBC 8.0  HGB 13.7  HCT 43.4  MCV 92.9  PLT 234   Basic Metabolic Panel: Recent Labs  Lab 06/03/19 1127 06/04/19 0417 06/05/19 0414  06/06/19 0442 06/07/19 0409  NA 136 139 136 137 135  K 4.1 4.0 4.0 4.0 4.6  CL 93* 96* 94* 94* 94*  CO2 30 29 29 31 29   GLUCOSE 133* 117* 134* 149* 134*  BUN 41* 42* 37* 35* 37*  CREATININE 1.60* 1.61* 1.39* 1.19* 1.20*  CALCIUM 8.9 9.1 8.7* 9.2 9.2  MG 2.2  --   --   --   --    GFR: Estimated Creatinine Clearance: 37.5 mL/min (A) (by C-G formula based on SCr of 1.2 mg/dL (H)).  Coagulation Profile: Recent Labs  Lab 06/03/19 1127  INR 1.2    Recent Results (from the past 240 hour(s))  SARS CORONAVIRUS 2 (TAT 6-24 HRS) Nasopharyngeal Nasopharyngeal Swab     Status: None   Collection Time: 06/03/19 12:58 PM   Specimen: Nasopharyngeal Swab  Result Value Ref Range Status   SARS Coronavirus 2 NEGATIVE NEGATIVE Final    Comment: (NOTE) SARS-CoV-2 target nucleic acids are NOT DETECTED. The SARS-CoV-2 RNA is generally detectable in upper and lower respiratory specimens during the acute phase of infection. Negative results do not preclude SARS-CoV-2 infection, do not rule out co-infections with other pathogens, and should not be used as the sole basis for treatment or other patient management decisions. Negative results must be combined with clinical observations, patient history, and epidemiological information. The expected result is Negative. Fact Sheet for Patients: 06/05/19 Fact Sheet for Healthcare Providers: HairSlick.no This test is not yet approved or cleared by the [employer] FDA and  has been authorized for detection and/or diagnosis of SARS-CoV-2 by FDA under an Emergency Use Authorization (EUA). This EUA will remain  in effect (meaning this test can be used) for the duration of the COVID-19 declaration under Section 56 4(b)(1) of the Act, 21 U.S.C. section 360bbb-3(b)(1), unless the authorization is terminated or revoked sooner. Performed at [facility], [address], [place],  [place] [address]   Respiratory Panel by RT PCR (Flu A&B, Covid) - Nasopharyngeal Swab     Status: None   Collection Time: 06/03/19  2:59 PM   Specimen: Nasopharyngeal Swab  Result Value Ref Range Status   SARS Coronavirus 2 by RT PCR NEGATIVE NEGATIVE Final    Comment: (NOTE) SARS-CoV-2 target nucleic acids are NOT DETECTED. The SARS-CoV-2 RNA is generally detectable in upper respiratoy specimens during the acute phase of infection. The lowest concentration of SARS-CoV-2 viral copies this assay can detect is 131 copies/mL. A negative result does not preclude SARS-Cov-2 infection and should not be used as the sole basis for treatment or other patient management decisions. A negative result may occur with  improper specimen collection/handling, submission of specimen other than nasopharyngeal swab, presence of viral mutation(s) within the areas targeted by this assay, and inadequate number of viral copies (<131 copies/mL). A negative result must be combined with clinical observations, patient history, and epidemiological information. The expected result is Negative. Fact Sheet for Patients:  https://www.moore.com/ Fact Sheet for Healthcare Providers:  https://www.young.biz/ This test is not yet ap proved or cleared by the [place] FDA and  has been authorized for detection and/or diagnosis of SARS-CoV-2 by FDA under an Emergency Use Authorization (EUA). This EUA will remain  in effect (meaning this test can be used) for the duration of the COVID-19 declaration under Section 564(b)(1) of the Act, 21 U.S.C. section 360bbb-3(b)(1), unless the authorization is terminated or revoked sooner.    Influenza A by PCR NEGATIVE NEGATIVE Final   Influenza B by PCR NEGATIVE NEGATIVE Final    Comment: (NOTE) The Xpert Xpress SARS-CoV-2/FLU/RSV assay is intended as an aid in  the diagnosis of influenza from Nasopharyngeal swab specimens and  should not be used  as a sole basis for treatment. Nasal washings and  aspirates are unacceptable for Xpert Xpress SARS-CoV-2/FLU/RSV  testing. Fact Sheet for Patients: https://www.moore.com/ Fact Sheet for Healthcare Providers: https://www.young.biz/ This test is not yet approved or cleared by the [place] FDA and  has been authorized for detection and/or diagnosis of SARS-CoV-2 by  FDA under an Emergency Use Authorization (EUA). This EUA will remain  in effect (meaning this test can be used) for the duration of the  Covid-19 declaration under Section 564(b)(1) of the Act, 21  U.S.C. section 360bbb-3(b)(1), unless the authorization is  terminated or revoked. Performed at [facility], [address], [place], [place] [address]   MRSA PCR Screening     Status: None   Collection Time: 06/03/19  5:03 PM   Specimen: Nasal Mucosa; Nasopharyngeal  Result Value Ref Range Status   MRSA by PCR NEGATIVE NEGATIVE Final    Comment:        The GeneXpert MRSA Assay (FDA approved for NASAL specimens only), is one component of a comprehensive MRSA colonization surveillance program. It is not intended to diagnose MRSA infection nor to guide or monitor treatment for MRSA infections. Performed at [facility], [address] [place], [place] [address]      Radiology Studies: No results found.  Scheduled Meds: .  apixaban  5 mg Oral BID  . arformoterol  15 mcg Nebulization BID  . budesonide (PULMICORT) nebulizer solution  0.5 mg Nebulization BID  . Chlorhexidine Gluconate Cloth  6 each Topical Daily  . cholecalciferol  2,000 Units Oral Daily  . docusate sodium  100 mg Oral BID  . furosemide  40 mg Intravenous Daily  . levothyroxine  112.5 mcg Oral Once per day on Mon Thu  . levothyroxine  75 mcg Oral Once per day on Sun Tue Wed Fri Sat  . mouth rinse  15 mL Mouth Rinse BID  . metoprolol succinate  50 mg Oral BID  . multivitamin-lutein  1 capsule Oral BID  .  polyethylene glycol  17 g Oral Daily  . sodium chloride flush  3 mL Intravenous Q12H  . umeclidinium bromide  1 puff Inhalation Daily   Continuous Infusions: . sodium chloride    . amiodarone 30 mg/hr (06/07/19 1048)     LOS: 3 days    Time spent: 35 minutes.  More than 50% of the time dedicated to face-to-face examination and coordination of care.  Remain in stepdown, continue to have difficult to control heart rate especially on exertion and is requiring amiodarone drip.  No chest pain, no nausea, no vomiting.  Reports feeling sick in her stomach and is complaining of constipation.   Vassie Loll, MD [facility] Pager [phone]   06/07/2019, 11:36 AM

## 2019-06-07 NOTE — Progress Notes (Signed)
Throughout shift patient was noncompliant with commands and requests of staff members. Patient refused to lay and sleep stating she could not breathe. Patient O2 saturations were above 93%. She would not allow nurse to sit her up in bed and use bed as a chair. Patient requesting to walk and sit in chair which is contraindicated due to Afib with RVR and SOB with exertion. HR increases drastically with any exertion and breathing effort increasing with exertion. Patient educated and education reiterated multiple times. Continue to monitor and advise.

## 2019-06-07 NOTE — Progress Notes (Signed)
Patient has been NPO since 2330 06/07/2019.

## 2019-06-08 ENCOUNTER — Encounter (HOSPITAL_COMMUNITY)
Admission: EM | Disposition: A | Discharge status: Home Health | Payer: Self-pay | Source: Home / Self Care | Attending: Internal Medicine

## 2019-06-08 ENCOUNTER — Inpatient Hospital Stay (HOSPITAL_COMMUNITY): Payer: Medicare Other | Admitting: Anesthesiology

## 2019-06-08 ENCOUNTER — Telehealth: Payer: Self-pay | Admitting: *Deleted

## 2019-06-08 DIAGNOSIS — I4891 Unspecified atrial fibrillation: Secondary | ICD-10-CM

## 2019-06-08 HISTORY — PX: CARDIOVERSION: SHX1299

## 2019-06-08 LAB — BASIC METABOLIC PANEL
Anion gap: 11 (ref 5–15)
BUN: 35 mg/dL — ABNORMAL HIGH (ref 8–23)
CO2: 33 mmol/L — ABNORMAL HIGH (ref 22–32)
Calcium: 9.2 mg/dL (ref 8.9–10.3)
Chloride: 92 mmol/L — ABNORMAL LOW (ref 98–111)
Creatinine, Ser: 1.23 mg/dL — ABNORMAL HIGH (ref 0.44–1.00)
GFR calc Af Amer: 46 mL/min — ABNORMAL LOW (ref >60)
GFR calc non Af Amer: 39 mL/min — ABNORMAL LOW (ref >60)
Glucose, Bld: 119 mg/dL — ABNORMAL HIGH (ref 70–99)
Potassium: 3.8 mmol/L (ref 3.5–5.1)
Sodium: 136 mmol/L (ref 135–145)

## 2019-06-08 SURGERY — CARDIOVERSION
Anesthesia: General

## 2019-06-08 MED ORDER — PROPOFOL 10 MG/ML IV BOLUS
INTRAVENOUS | Status: DC | PRN
  Administered 2019-06-08 (×3): 10 mg via INTRAVENOUS

## 2019-06-08 MED ORDER — PHENOL 1.4 % MT LIQD: 1.0000 | OROMUCOSAL | Status: DC | PRN

## 2019-06-08 MED ORDER — ETOMIDATE 2 MG/ML IV SOLN
INTRAVENOUS | Status: DC | PRN
  Administered 2019-06-08 (×2): 2 mg via INTRAVENOUS

## 2019-06-08 MED ORDER — MIDAZOLAM HCL 5 MG/5ML IJ SOLN
INTRAMUSCULAR | Status: DC | PRN
  Administered 2019-06-08: .5 mg via INTRAVENOUS

## 2019-06-08 MED ORDER — AMIODARONE HCL 200 MG PO TABS
200.0000 mg | ORAL_TABLET | Freq: Two times a day (BID) | ORAL | Status: DC
  Administered 2019-06-09 – 2019-06-11 (×5): 200 mg via ORAL
  Filled 2019-06-08 (×8): qty 1

## 2019-06-08 MED ORDER — LACTATED RINGERS IV SOLN: INTRAVENOUS | Status: DC | PRN

## 2019-06-08 MED ORDER — MIDAZOLAM HCL 2 MG/2ML IJ SOLN
INTRAMUSCULAR | Status: AC
  Filled 2019-06-08: qty 2

## 2019-06-08 NOTE — Progress Notes (Signed)
PROGRESS NOTE    Julie Burns  ZYS:[id number] DOB: [birth year] DOA: 06/03/2019 PCP: Kirstie Peri, MD     Brief Narrative:  86 y.o. female with PMH significant for a. Fib, chronic resp failure, diastolic HF, HTN, HLD, hypothyroidism, anxiety, type 2 diabetes and CKD stage 3B; who presented to ED complaining of general malaise, fatigue and worsening breathing. Patient expressed SOB has been ongoing for the last 4 days or so, she was seen at [facility] ED and kept overnight, without significant changes in her symptoms or clear explanation of what was causing her to be more SOB. Despite some extra lasix at home she has continue experiencing worsening in her breathing and as found hypoxic by her caregiver.  No frank CP, but expressed feeling not well, patient denies nausea, vomiting, dysuria, hematuria, focal neurologic deficits, headaches or any other complaints.  In ED was found to be in a. Fib with RVR, elevated BNP and CXR demonstrating vascular congestion and interstitial edema; IV lopressor and IV lasix given in ED, patient oxygen also bump to 4L to maintain O2 sat above 90%. TRH consulted to place patient in the hospital for further evaluation and management of her a. Fib with RVR and CHF exacerbation. Cardiology service was consulted.    Assessment & Plan: 1-Acute on chronic diastolic heart failure -In the setting of A. fib with RVR -Remains in a stepdown -Continue adjusted dose of metoprolol and IV amiodarone.  Patient has remained difficult to control and experiencing shortness of breath with minimal exertion.  Anticipate cardioversion later today (06/08/2019). -Continue IV Lasix and closely follow renal function electrolytes. -Continue holding ACE inhibitor/ARB in the setting of acute exacerbation and the presence of acute on chronic renal failure. -Continue to follow daily weights, strict I's and O's and low-sodium diet. -Cardiology service is on board and will follow  recommendations. -Recent echocardiogram demonstrating preserved ejection fraction, diastolic dysfunction and signs of pulmonary hypertension.  2-acute on chronic respiratory failure with hypoxia: In the setting of COPD -Appears to be secondary to problem #1 -Pulmonary hypertension and underlying COPD contributing -Continue bronchodilators and nebulizer management -Actively no wheezing and no requiring a steroids. -Follow clinical response to above treatment and wean down oxygen supplementation to baseline as tolerated.  3-A. fib with RVR: Chronic/longstanding paroxysmal atrial fibrillation. -Continue adjusted dose of metoprolol -Loading dose of amiodarone and initiation of amiodarone drip given. -Heart rate/rhythm is still difficult to control -Continue Eliquis for secondary prevention -Follow cardiology service recommendations.  4-acute on chronic stage III renal failure -Patient stage IIIb at baseline -Appears to be experiencing worsening in renal function secondary to decreased perfusion in the setting of CHF -Continue to minimize/avoid extra nephrotoxic agents -Continue IV diuresis and follow renal function trend closely; patient is becoming euvolemic.. -Creatinine improved to 1.19  5-anxiety -Overall stable mood -Continue as needed lorazepam.  6-hypothyroidism -TSH within normal limits -Continue Synthroid  7-chronic pain syndrome -Overall stable -Continue Tylenol and as needed tramadol.  8-constipation -continue using miralax and Colace. -will also add as needed Dulcolax suppository.  DVT prophylaxis: Chronically on Eliquis. Code Status: DNR Family Communication: No family at bedside. Disposition Plan: Remains inpatient, still having trouble controlling heart rate and is requiring the use of IV amiodarone drip.  Continue Eliquis and adjusted dose of metoprolol.  Continue oxygen supplementation and follow renal function and electrolytes.  Most likely cardioversion later  today.  Consultants:   Cardiology service  Procedures:   See below for x-ray reports.  Antimicrobials:  Anti-infectives (From admission,  onward)   None      Subjective: Reports having bowel movement overnight.  No chest pain, no nausea, no vomiting.  Still short of breath on exertion and with difficult to control heart rate/rhythm.  Objective: Vitals:   06/08/19 1030 06/08/19 1120 06/08/19 1130 06/08/19 1200  BP: 119/80  100/74   Pulse: (!) 128 (!) 102 (!) 109 83  Resp: (!) 21 (!) 23 (!) 24 (!) 23  Temp:  97.9 F (36.6 C)    TempSrc:  Oral    SpO2: 94% 95% 95% 95%  Weight:      Height:        Intake/Output Summary (Last 24 hours) at 06/08/2019 1344 Last data filed at 06/08/2019 1100 Gross per 24 hour  Intake 782.28 ml  Output 1051 ml  Net -268.72 ml   Filed Weights   06/06/19 0409 06/07/19 0500 06/08/19 0424  Weight: 90.8 kg 91.1 kg 91.8 kg    Examination: General exam: Alert, awake, oriented x 3; still complaining of shortness of breath with minimal exertion and noted to have irregular/difficult to control rate and breathing.  Continue to be on amiodarone drip.  No fever, no nausea, no vomiting.  Reports having bowel movement overnight. Respiratory system: No wheezing, no frank crackles, decreased breath sounds at the bases.  Normal respiratory effort at rest. Cardiovascular system: Irregular irregular, no rubs, no gallops, no JVD on exam. Gastrointestinal system: Abdomen is nondistended, soft and nontender. No organomegaly or masses felt. Normal bowel sounds heard. Central nervous system: Alert and oriented. No focal neurological deficits. Extremities: No cyanosis or clubbing. Skin: No rashes, no petechiae. Psychiatry: Judgement and insight appear normal. Mood & affect appropriate.    Data Reviewed: I have personally reviewed following labs and imaging studies  CBC: Recent Labs  Lab 06/03/19 1127  WBC 8.0  HGB 13.7  HCT 43.4  MCV 92.9  PLT 709   Basic  Metabolic Panel: Recent Labs  Lab 06/03/19 1127 06/03/19 1127 06/04/19 0417 06/05/19 0414 06/06/19 0442 06/07/19 0409 06/08/19 0345  NA 136   < > 139 136 137 135 136  K 4.1   < > 4.0 4.0 4.0 4.6 3.8  CL 93*   < > 96* 94* 94* 94* 92*  CO2 30   < > 29 29 31 29  33*  GLUCOSE 133*   < > 117* 134* 149* 134* 119*  BUN 41*   < > 42* 37* 35* 37* 35*  CREATININE 1.60*   < > 1.61* 1.39* 1.19* 1.20* 1.23*  CALCIUM 8.9   < > 9.1 8.7* 9.2 9.2 9.2  MG 2.2  --   --   --   --   --   --    < > = values in this interval not displayed.   GFR: Estimated Creatinine Clearance: 36.8 mL/min (A) (by C-G formula based on SCr of 1.23 mg/dL (H)).  Coagulation Profile: Recent Labs  Lab 06/03/19 1127  INR 1.2    Recent Results (from the past 240 hour(s))  SARS CORONAVIRUS 2 (TAT 6-24 HRS) Nasopharyngeal Nasopharyngeal Swab     Status: None   Collection Time: 06/03/19 12:58 PM   Specimen: Nasopharyngeal Swab  Result Value Ref Range Status   SARS Coronavirus 2 NEGATIVE NEGATIVE Final    Comment: (NOTE) SARS-CoV-2 target nucleic acids are NOT DETECTED. The SARS-CoV-2 RNA is generally detectable in upper and lower respiratory specimens during the acute phase of infection. Negative results do not preclude SARS-CoV-2 infection, do not  rule out co-infections with other pathogens, and should not be used as the sole basis for treatment or other patient management decisions. Negative results must be combined with clinical observations, patient history, and epidemiological information. The expected result is Negative. Fact Sheet for Patients: HairSlick.no Fact Sheet for Healthcare Providers: quierodirigir.com This test is not yet approved or cleared by the [place] FDA and  has been authorized for detection and/or diagnosis of SARS-CoV-2 by FDA under an Emergency Use Authorization (EUA). This EUA will remain  in effect (meaning this test can be  used) for the duration of the COVID-19 declaration under Section 56 4(b)(1) of the Act, 21 U.S.C. section 360bbb-3(b)(1), unless the authorization is terminated or revoked sooner. Performed at [facility], [address], [place], [place] [address]   Respiratory Panel by RT PCR (Flu A&B, Covid) - Nasopharyngeal Swab     Status: None   Collection Time: 06/03/19  2:59 PM   Specimen: Nasopharyngeal Swab  Result Value Ref Range Status   SARS Coronavirus 2 by RT PCR NEGATIVE NEGATIVE Final    Comment: (NOTE) SARS-CoV-2 target nucleic acids are NOT DETECTED. The SARS-CoV-2 RNA is generally detectable in upper respiratoy specimens during the acute phase of infection. The lowest concentration of SARS-CoV-2 viral copies this assay can detect is 131 copies/mL. A negative result does not preclude SARS-Cov-2 infection and should not be used as the sole basis for treatment or other patient management decisions. A negative result may occur with  improper specimen collection/handling, submission of specimen other than nasopharyngeal swab, presence of viral mutation(s) within the areas targeted by this assay, and inadequate number of viral copies (<131 copies/mL). A negative result must be combined with clinical observations, patient history, and epidemiological information. The expected result is Negative. Fact Sheet for Patients:  https://www.moore.com/ Fact Sheet for Healthcare Providers:  https://www.young.biz/ This test is not yet ap proved or cleared by the [place] FDA and  has been authorized for detection and/or diagnosis of SARS-CoV-2 by FDA under an Emergency Use Authorization (EUA). This EUA will remain  in effect (meaning this test can be used) for the duration of the COVID-19 declaration under Section 564(b)(1) of the Act, 21 U.S.C. section 360bbb-3(b)(1), unless the authorization is terminated or revoked sooner.    Influenza A by  PCR NEGATIVE NEGATIVE Final   Influenza B by PCR NEGATIVE NEGATIVE Final    Comment: (NOTE) The Xpert Xpress SARS-CoV-2/FLU/RSV assay is intended as an aid in  the diagnosis of influenza from Nasopharyngeal swab specimens and  should not be used as a sole basis for treatment. Nasal washings and  aspirates are unacceptable for Xpert Xpress SARS-CoV-2/FLU/RSV  testing. Fact Sheet for Patients: https://www.moore.com/ Fact Sheet for Healthcare Providers: https://www.young.biz/ This test is not yet approved or cleared by the [place] FDA and  has been authorized for detection and/or diagnosis of SARS-CoV-2 by  FDA under an Emergency Use Authorization (EUA). This EUA will remain  in effect (meaning this test can be used) for the duration of the  Covid-19 declaration under Section 564(b)(1) of the Act, 21  U.S.C. section 360bbb-3(b)(1), unless the authorization is  terminated or revoked. Performed at [facility], [address], [place], [place] [address]   MRSA PCR Screening     Status: None   Collection Time: 06/03/19  5:03 PM   Specimen: Nasal Mucosa; Nasopharyngeal  Result Value Ref Range Status   MRSA by PCR NEGATIVE NEGATIVE Final    Comment:  The GeneXpert MRSA Assay (FDA approved for NASAL specimens only), is one component of a comprehensive MRSA colonization surveillance program. It is not intended to diagnose MRSA infection nor to guide or monitor treatment for MRSA infections. Performed at [facility], [address], [place], [place] [address]      Radiology Studies: No results found.  Scheduled Meds: . [MAR Hold] apixaban  5 mg Oral BID  . [MAR Hold] arformoterol  15 mcg Nebulization BID  . [MAR Hold] budesonide (PULMICORT) nebulizer solution  0.5 mg Nebulization BID  . [MAR Hold] Chlorhexidine Gluconate Cloth  6 each Topical Daily  . [MAR Hold] cholecalciferol  2,000 Units Oral Daily  . [MAR Hold] docusate sodium   100 mg Oral BID  . [MAR Hold] furosemide  40 mg Intravenous Daily  . [MAR Hold] levothyroxine  112.5 mcg Oral Once per day on Mon Thu  . [MAR Hold] levothyroxine  75 mcg Oral Once per day on Sun Tue Wed Fri Sat  . [MAR Hold] mouth rinse  15 mL Mouth Rinse BID  . [MAR Hold] metoprolol succinate  50 mg Oral BID  . [MAR Hold] multivitamin-lutein  1 capsule Oral BID  . [MAR Hold] polyethylene glycol  17 g Oral Daily  . [MAR Hold] sodium chloride flush  3 mL Intravenous Q12H  . [MAR Hold] umeclidinium bromide  1 puff Inhalation Daily   Continuous Infusions: . [MAR Hold] sodium chloride    . amiodarone 30 mg/hr (06/08/19 1317)     LOS: 4 days    Time spent: 35 minutes.     Vassie Loll, MD [facility] Pager [phone]   06/08/2019, 1:44 PM

## 2019-06-08 NOTE — Progress Notes (Signed)
Electrical Cardioversion Procedure Note [name] [id number] [birth year]  Procedure: Electrical Cardioversion Indications:  Atrial fibrillation  Procedure Details Consent: confirmed and on chart Time Out: Verified patient identification, verified procedure, site/side was marked, verified correct patient position, special equipment/implants available, medications/allergies/relevent history reviewed, required imaging and test results available.  1413  Patient placed on cardiac monitor, pulse oximetry, supplemental oxygen as necessary.  Sedation given: per anesthesia Pacer pads placed anterior posterior placement confirmed by Dr. Wyline Mood  Cardioverted 1 time(s).  Cardioverted at 1418  Evaluation Findings: Post procedure EKG shows: EKG @ 1422 sinus bradycardia Complications: none Patient did} tolerate procedure well.   Julie Burns 06/08/2019, 2:39 PM

## 2019-06-08 NOTE — Telephone Encounter (Signed)
Noted. Refill sent back with notes

## 2019-06-08 NOTE — Telephone Encounter (Signed)
Received refill request for amiodaarone CL 200mg  tab 1 po QD #90

## 2019-06-08 NOTE — Anesthesia Postprocedure Evaluation (Signed)
Anesthesia Post Note  Patient: Julie Burns  Procedure(s) Performed: CARDIOVERSION (N/A )  Patient location during evaluation: PACU Anesthesia Type: General Level of consciousness: sedated Pain management: pain level controlled Vital Signs Assessment: post-procedure vital signs reviewed and stable Respiratory status: spontaneous breathing, respiratory function stable, nonlabored ventilation and patient connected to face mask oxygen Cardiovascular status: stable Postop Assessment: no apparent nausea or vomiting Anesthetic complications: no     Last Vitals:  Vitals:   06/08/19 1350 06/08/19 1355  BP: 106/78 121/86  Pulse:    Resp: (!) 37 (!) 36  Temp:    SpO2: (!) 89% 90%    Last Pain:  Vitals:   06/08/19 1348  TempSrc: Oral  PainSc: 0-No pain                 Soledad Budreau

## 2019-06-08 NOTE — Care Management Important Message (Signed)
Important Message  Patient Details  Name: Julie Burns MRN: [id number] Date of Birth: [birth year]   Medicare Important Message Given:  Yes     Corey Harold 06/08/2019, 12:40 PM

## 2019-06-08 NOTE — Progress Notes (Addendum)
Progress Note  Patient Name: Julie Burns Date of Encounter: 06/08/2019  Primary Cardiologist: Minus Breeding, MD   Subjective   No chest pain or SOB that has improved.  Inpatient Medications    Scheduled Meds: . apixaban  5 mg Oral BID  . arformoterol  15 mcg Nebulization BID  . budesonide (PULMICORT) nebulizer solution  0.5 mg Nebulization BID  . Chlorhexidine Gluconate Cloth  6 each Topical Daily  . cholecalciferol  2,000 Units Oral Daily  . docusate sodium  100 mg Oral BID  . furosemide  40 mg Intravenous Daily  . levothyroxine  112.5 mcg Oral Once per day on Mon Thu  . levothyroxine  75 mcg Oral Once per day on Sun Tue Wed Fri Sat  . mouth rinse  15 mL Mouth Rinse BID  . metoprolol succinate  50 mg Oral BID  . multivitamin-lutein  1 capsule Oral BID  . polyethylene glycol  17 g Oral Daily  . sodium chloride flush  3 mL Intravenous Q12H  . umeclidinium bromide  1 puff Inhalation Daily   Continuous Infusions: . sodium chloride    . amiodarone 30 mg/hr (06/08/19 0503)   PRN Meds: sodium chloride, acetaminophen, benzonatate, bisacodyl, levalbuterol, LORazepam, melatonin, nitroGLYCERIN, ondansetron (ZOFRAN) IV, ondansetron, sodium chloride flush, traMADol   Vital Signs    Vitals:   06/08/19 0700 06/08/19 0725 06/08/19 0730 06/08/19 0757  BP: 121/76  102/89   Pulse: 72 93 99   Resp: (!) 21 (!) 25 (!) 26   Temp:  (!) 97.1 F (36.2 C)    TempSrc:  Oral    SpO2: 97% 95% 94% 94%  Weight:      Height:        Intake/Output Summary (Last 24 hours) at 06/08/2019 0830 Last data filed at 06/08/2019 0730 Gross per 24 hour  Intake 829.61 ml  Output 803 ml  Net 26.61 ml   Last 3 Weights 06/08/2019 06/07/2019 06/06/2019  Weight (lbs) 202 lb 6.1 oz 200 lb 13.4 oz 200 lb 2.8 oz  Weight (kg) 91.8 kg 91.1 kg 90.8 kg      Telemetry    A fib with rate control  - Personally Reviewed  ECG    No new - Personally Reviewed  Physical Exam   GEN: No acute distress.   Neck: No  JVD sitting up in chair Cardiac: irreg irreg, no murmurs, rubs, or gallops.  Respiratory: diminished Rt > Lt to auscultation bilaterally. GI: Soft, nontender, non-distended  MS: 1+ edema of lower ext but improved; No deformity. Neuro:  Nonfocal  Psych: Normal affect   Labs    High Sensitivity Troponin:   Recent Labs  Lab 06/03/19 1127 06/03/19 1312  TROPONINIHS 27* 28*      Chemistry Recent Labs  Lab 06/06/19 0442 06/07/19 0409 06/08/19 0345  NA 137 135 136  K 4.0 4.6 3.8  CL 94* 94* 92*  CO2 31 29 33*  GLUCOSE 149* 134* 119*  BUN 35* 37* 35*  CREATININE 1.19* 1.20* 1.23*  CALCIUM 9.2 9.2 9.2  GFRNONAA 41* 41* 39*  GFRAA 48* 47* 46*  ANIONGAP 12 12 11      Hematology Recent Labs  Lab 06/03/19 1127  WBC 8.0  RBC 4.67  HGB 13.7  HCT 43.4  MCV 92.9  MCH 29.3  MCHC 31.6  RDW 14.7  PLT 234    BNP Recent Labs  Lab 06/03/19 1127  BNP 1,086.0*     DDimer No results for input(s):  DDIMER in the last 168 hours.   Radiology    No results found.  Cardiac Studies   Echocardiogram: 04/2019 IMPRESSIONS    1. Left ventricular ejection fraction, by visual estimation, is 60 to  65%. The left ventricle has normal function. There is moderately increased  left ventricular hypertrophy.  2. The left ventricle has no regional wall motion abnormalities.  3. Moderate mitral annular calcification. Probably mild mitral stenosis  (mean gradient 4 mmHg).  4. The tricuspid valve is grossly normal.  5. The tricuspid valve is grossly normal. Tricuspid valve regurgitation  is moderate.  6. The aortic valve (porcine bioprosthesis) was not well visualized.  Aortic valve regurgitation is not visualized. No evidence of aortic valve  sclerosis or stenosis.  7. Aortic dilatation noted.  8. There is mild dilatation of the aortic root.  9. Severely elevated pulmonary artery systolic pressure.  10. The pulmonic valve was grossly normal. Pulmonic valve regurgitation  is  mild.  11. Pulmonic regurgitation is mild.  12. Global right ventricle has normal systolic function.The right  ventricular size is normal. Mildly increased right ventricular wall  thickness.  13. Right atrial size was moderately dilated.  14. The mitral valve is degenerative. Mild mitral valve regurgitation.  15. Left atrial size was severely dilated.  16. Left ventricular diastolic parameters are consistent with Grade II  diastolic dysfunction (pseudonormalization).  17. Elevated left ventricular end-diastolic pressure.  18. Pulmonary hypertension is severe.  19. The inferior vena cava is dilated in size with >50% respiratory  variability, suggesting right atrial pressure of 8 mmHg.    Patient Profile     86 y.o. female  w/ PMH of chronic diastolic CHF,s/p aortic valve replacement in 2010 and 2014, CAD (s/p DES to RCA and staged PCI of LAD and LCx in 2015), paroxysmal atrial fibrillation (s/p DCCV in 2017), carotid artery stenosis (s/p L CEA), HTN, HLD, COPD and severe pulmonary HTNwho is currently admitted for an acute CHF exacerbation and atrial fibrillation with RVR.   Assessment & Plan    1. Atrial Fibrillation with RVR - she has a known history of paroxysmal atrial fibrillation but her last known episode was in 2017. She has overall been unaware of her arrhythmia and denies any palpitations. - electrolytes and Mg WNL. TSH 1.948. Recent echo showed a preserved EF with known Pulmonary HTN.  - She has been started on IV Amiodarone (on PO Amiodarone 200mg  daily PTA) and rates remain in the 100's to 110's at rest, increasing into the 130's with activity and she is symptomatic with this. SBP is in the 90's and does not allow for further titration of AV nodal blocking agents and Toprol-XL has already been increased from 50mg  daily to 50mg  BID. Would continue with IV Amiodarone for now. If rates do not improve or she does not convert back to NSR over the weekend, would likely need to  attempt DCCV on Monday. She does have severe LA dilation so unsure how long she would maintain NSR if this is achieved.  - she reports good compliance with Eliquis and has not missed any recent doses. Was on 5mg  BID prior to admission but reduced to 2.5mg  BID on admission given elevated renal function at that time. Needs to go back to 5mg  BID now as the only indication for reduced dosing at this time is age.  -troponin 27 and 28 most likely due to demand ischemia with tachycardia and HF. -discussed DCCV with pt and will plan for today  2. Acute on Chronic Diastolic CHF -BNPelevated [id number] on admission andCXR showed stable cardiomegaly with pulmonary vascular congestion and a small right pleural effusion. - she has been receiving IV Lasix 40mg  BID now at Lasix 40 mg IV daily.  Creatinine continues to improve with diuresis (1.61 --> 1.23), therefore will continue with IV Lasix given volume overload on exam. If creatinine starts to worsen, would switch back to PO Lasix 40mg  BID.  -wt has continued to climb with stay.  Unknown if admit wt. Was correct.    3. History of Prosthetic Aortic Valve Replacement - s/p AVR in 2010 and re-do surgery in 2014 by review of Care Everywhere. Recent echo in 04/2019 showed no evidence of stenosis.   4. CAD - s/p DES to RCA and staged PCI of LAD and LCx in 2015. She denies any recent anginal symptoms and HS Troponin values have been flat at 27 and 28. - continue BB. She is not on ASA due to being on Eliquis and is not on statin therapy due to intolerances in the past.   5. Pulmonary HTN - felt to be secondary to O2-dependent COPD. Pulmonary HTN severe by most recent echocardiogram. Remains on 3L [place] at baseline.   6. Acute on Chronic Stage 3 CKD - baseline creatinine 0.9 - 1.0. Elevated to 1.60 on admission and improved to 1.23 today with diuresis.       For questions or updates, please contact [facility] Please consult [email or web address] for contact  info under        Signed, 05/2019, NP  06/08/2019, 8:30 AM    Patient seen and discussed with PA Nada Boozer, I agree with her documentation. Admitted with afib with RVR, started on IV amio (had been on oral amio at home), soft bp's limited other medicatl therapy. Has been on eliquis at home without missed doses, plan for DCCV today.   Presented with acute on chronic diastolic HF. Echo this admit LVEF 60-65%, Currently on IV lasix 40mg  daily. I/Os + yeseterday 267, overall totals incomplete this admission. Cr stable at 1.2 last few days.Continue IV diureisis   08/08/2019 MD

## 2019-06-08 NOTE — Telephone Encounter (Signed)
We do not prescribe. Thanks!

## 2019-06-08 NOTE — Transfer of Care (Signed)
Immediate Anesthesia Transfer of Care Note  Patient: Julie Burns  Procedure(s) Performed: CARDIOVERSION (N/A )  Patient Location: PACU  Anesthesia Type:MAC  Level of Consciousness: drowsy  Airway & Oxygen Therapy: Patient Spontanous Breathing and Patient connected to face mask oxygen  Post-op Assessment: Report given to RN and Post -op Vital signs reviewed and stable  Post vital signs: Reviewed and stable  Last Vitals:  Vitals Value Taken Time  BP    Temp    Pulse    Resp    SpO2      Last Pain:  Vitals:   06/08/19 1348  TempSrc: Oral  PainSc: 0-No pain         Complications: No apparent anesthesia complications

## 2019-06-08 NOTE — CV Procedure (Signed)
Procedure: electrical cardioversion Physician: Dr Dina Rich MD Indication: Afib with RVR  Patient was brought to the procedure suite after appropriate consent was obtained. Sedation was achieved with the assistance of anesthesiology, please see there records for details. Pads placed in the anterior and posterior positions. She was succesfully converted with a single 200j synchronized shock to sinus brady high 40s, bps 130s/70s.    Dina Rich MD

## 2019-06-08 NOTE — Anesthesia Preprocedure Evaluation (Signed)
Anesthesia Evaluation  Patient identified by MRN, date of birth, ID band Patient awake    Reviewed: Allergy & Precautions, NPO status , Patient's Chart, lab work & pertinent test results, reviewed documented beta blocker date and time   Airway Mallampati: II  TM Distance: >3 FB Neck ROM: Full    Dental  (+) Upper Dentures, Edentulous Lower   Pulmonary COPD,  oxygen dependent, former smoker,    breath sounds clear to auscultation       Cardiovascular Exercise Tolerance: Poor hypertension, Pt. on medications and Pt. on home beta blockers + CAD, + Past MI, + CABG, + Peripheral Vascular Disease and +CHF  + dysrhythmias Atrial Fibrillation + Valvular Problems/Murmurs (AVR - 2010) AS  Rhythm:Irregular Rate:Tachycardia + Systolic murmurs- Diastolic murmurs, - Friction Rub, - Carotid Bruit, - Peripheral Edema and - Systolic Click 03-Jun-2019 11:13:53  [facility] ROUTINE RECORD Atrial fibrillation LAD, consider left anterior fascicular block Abnormal R-wave progression, late transition LVH with secondary repolarization abnormality 59mm/s 41mm/mV 150Hz  9.0.4 CID:  ECHO - 04/2019  1. Left ventricular ejection fraction, by visual estimation, is 60 to  65%. The left ventricle has normal function. There is moderately increased  left ventricular hypertrophy.  2. The left ventricle has no regional wall motion abnormalities.  3. Moderate mitral annular calcification. Probably mild mitral stenosis  (mean gradient 4 mmHg).  4. The tricuspid valve is grossly normal.  5. The tricuspid valve is grossly normal. Tricuspid valve regurgitation  is moderate.  6. The aortic valve (porcine bioprosthesis) was not well visualized.  Aortic valve regurgitation is not visualized. No evidence of aortic valve  sclerosis or stenosis.  7. Aortic dilatation noted.  8. There is mild dilatation of the aortic root.  9. Severely  elevated pulmonary artery systolic pressure.  10. The pulmonic valve was grossly normal. Pulmonic valve regurgitation is  mild.  11. Pulmonic regurgitation is mild.  12. Global right ventricle has normal systolic function.The right  ventricular size is normal. Mildly increased right ventricular wall  thickness.  13. Right atrial size was moderately dilated.  14. The mitral valve is degenerative. Mild mitral valve regurgitation.  15. Left atrial size was severely dilated.  16. Left ventricular diastolic parameters are consistent with Grade II  diastolic dysfunction (pseudonormalization).  17. Elevated left ventricular end-diastolic pressure.  18. Pulmonary hypertension is severe.  19. The inferior vena cava is dilated in size with >50% respiratory  variability, suggesting right atrial pressure of 8 mmHg.    Neuro/Psych PSYCHIATRIC DISORDERS Anxiety Depression CVA    GI/Hepatic   Endo/Other  diabetes, Type 2Hypothyroidism   Renal/GU Renal disease     Musculoskeletal   Abdominal   Peds  Hematology   Anesthesia Other Findings History of Present Illness:09/03/18 Julie Burns is a 86 y.o. female who presents for follow-up of CHF and atrial fibrillation.  She had AVR in 2010 (106mm Magna valve)and PAF on anticoagulation and amiodarone.  Of note she had a cardioversion in 2017 with post cardioversion pause requiring atropine.  She also has CAD with DES to the RCA staged PCI to the LAD and circumflex in 2015.  She was thought not to be a CABG candidate.  Since I last saw her she has had episodes of chest discomfort.  This is been sporadic.  It is been mild.  It does not radiate.  She taken nitroglycerin goes away.  She thinks it similar to previous angina.  She has not had any discomfort into her  jaw or arms.  She is not been having any new shortness of breath, PND or orthopnea.  She is limited her activity her oxygen most of the time.  Her symptoms come on at rest.  She is had no new  weight gain or edema.   Reproductive/Obstetrics                             Anesthesia Physical Anesthesia Plan  ASA: IV  Anesthesia Plan: General   Post-op Pain Management:    Induction: Intravenous  PONV Risk Score and Plan:   Airway Management Planned: Nasal Cannula, Natural Airway and Simple Face Mask  Additional Equipment:   Intra-op Plan:   Post-operative Plan:   Informed Consent: I have reviewed the patients History and Physical, chart, labs and discussed the procedure including the risks, benefits and alternatives for the proposed anesthesia with the patient or authorized representative who has indicated his/her understanding and acceptance.    Discussed DNR with patient.     Plan Discussed with: CRNA and Surgeon  Anesthesia Plan Comments: (Discussed with patient regarding DNR, Agreed to get brief resuscitation if patient goes into cardiac arrest during the procedure.)        Anesthesia Quick Evaluation

## 2019-06-08 NOTE — Telephone Encounter (Signed)
lmtcb for pt.  

## 2019-06-09 LAB — BASIC METABOLIC PANEL
Anion gap: 11 (ref 5–15)
BUN: 38 mg/dL — ABNORMAL HIGH (ref 8–23)
CO2: 31 mmol/L (ref 22–32)
Calcium: 8.9 mg/dL (ref 8.9–10.3)
Chloride: 94 mmol/L — ABNORMAL LOW (ref 98–111)
Creatinine, Ser: 1.41 mg/dL — ABNORMAL HIGH (ref 0.44–1.00)
GFR calc Af Amer: 39 mL/min — ABNORMAL LOW (ref >60)
GFR calc non Af Amer: 33 mL/min — ABNORMAL LOW (ref >60)
Glucose, Bld: 126 mg/dL — ABNORMAL HIGH (ref 70–99)
Potassium: 3.8 mmol/L (ref 3.5–5.1)
Sodium: 136 mmol/L (ref 135–145)

## 2019-06-09 LAB — MAGNESIUM: Magnesium: 2 mg/dL (ref 1.7–2.4)

## 2019-06-09 NOTE — Progress Notes (Signed)
PROGRESS NOTE    Julie Burns  TKZ:[id number] DOB: 1930-08-19 DOA: 06/03/2019 PCP: Monico Blitz, MD     Brief Narrative:  86 y.o. female with PMH significant for a. Fib, chronic resp failure, diastolic HF, HTN, HLD, hypothyroidism, anxiety, type 2 diabetes and CKD stage 3B; who presented to ED complaining of general malaise, fatigue and worsening breathing. Patient expressed SOB has been ongoing for the last 4 days or so, she was seen at [facility] ED and kept overnight, without significant changes in her symptoms or clear explanation of what was causing her to be more SOB. Despite some extra lasix at home she has continue experiencing worsening in her breathing and as found hypoxic by her caregiver.  No frank CP, but expressed feeling not well, patient denies nausea, vomiting, dysuria, hematuria, focal neurologic deficits, headaches or any other complaints.  In ED was found to be in a. Fib with RVR, elevated BNP and CXR demonstrating vascular congestion and interstitial edema; IV lopressor and IV lasix given in ED, patient oxygen also bump to 4L to maintain O2 sat above 90%. TRH consulted to place patient in the hospital for further evaluation and management of her a. Fib with RVR and CHF exacerbation. Cardiology service was consulted.    Assessment & Plan: 1-Acute on chronic diastolic heart failure -In the setting of A. fib with RVR -Remains in a stepdown -Continue adjusted dose of metoprolol and oral amiodarone.  Status post cardioversion on 06/08/2019.  Denies chest pain, no palpitations, overall feeling better; still having some shortness of breath. -Continue IV Lasix and closely follow renal function electrolytes. -Continue holding ACE inhibitor/ARB in the setting of acute exacerbation and the presence of acute on chronic renal failure. -Continue to follow daily weights, strict I's and O's and low-sodium diet. -Cardiology service is on board and will follow recommendations. -Recent  echocardiogram demonstrating preserved ejection fraction, diastolic dysfunction and signs of pulmonary hypertension.  2-acute on chronic respiratory failure with hypoxia: In the setting of COPD -Appears to be secondary to problem #1 -Pulmonary hypertension and underlying COPD contributing -Continue bronchodilators and nebulizer management -Actively no wheezing and no requiring steroids. -Follow clinical response to above treatment and wean down oxygen supplementation to baseline as tolerated.  3-A. fib with RVR: Chronic/longstanding paroxysmal atrial fibrillation. -Continue adjusted dose of metoprolol -Loading dose of amiodarone and initiation of amiodarone drip given. -Heart rate/rhythm remained difficult to control and on 06/08/2019 ended requiring cardioversion. -Currently sinus and with stable rate.  -Continue Eliquis for secondary prevention -Follow cardiology service recommendations.  4-acute on chronic stage III renal failure -Patient stage IIIb at baseline -Appears to be experiencing worsening in renal function secondary to decreased perfusion in the setting of CHF -Continue to minimize/avoid extra nephrotoxic agents -Continue IV diuresis and follow renal function trend closely; patient is becoming euvolemic.. -Creatinine improved to 1.19; but up again today to 1.4  5-anxiety -Overall stable mood -Continue as needed lorazepam.  6-hypothyroidism -TSH within normal limits -Continue Synthroid  7-chronic pain syndrome -Overall stable -Continue Tylenol and as needed tramadol.  8-constipation -continue using miralax and Colace. -Patient has accomplish a bowel movement and denies any abdominal discomfort currently. -Tolerating diet without problems, no nausea, no vomiting.  DVT prophylaxis: Chronically on Eliquis. Code Status: DNR Family Communication: No family at bedside. Disposition Plan: Remains inpatient, will transfer to telemetry bed; continue oral amiodarone and  oral Eliquis.  1 more day of IV Lasix and follow cardiology recommendations of further medication adjustment for her heart.  Continue nebulizer management.  Oxygen has been arranged based on her needs and will have home health services at time of discharge.  Hopefully home in the next 24 hours.  Consultants:   Cardiology service  Procedures:   See below for x-ray reports.  Antimicrobials:  Anti-infectives (From admission, onward)   None      Subjective: No fever, no nausea or vomiting.  Reports having bowel movement overnight.  Expressed feeling overall better even if still short of breath on exertion.  After cardioversion denies chest pain, palpitations and has remained in sinus rhythm and with rate controlled.  Objective: Vitals:   06/09/19 1131 06/09/19 1230 06/09/19 1400 06/09/19 1428  BP:  136/65 130/74 (!) 150/63  Pulse: (!) 52 88 (!) 56 (!) 55  Resp: 16 (!) 22 20 18   Temp: 97.9 F (36.6 C)   (!) 97.4 F (36.3 C)  TempSrc: Axillary   Oral  SpO2: 95% 91% 94% 96%  Weight:    90.9 kg  Height:        Intake/Output Summary (Last 24 hours) at 06/09/2019 1617 Last data filed at 06/09/2019 0932 Gross per 24 hour  Intake 366 ml  Output 300 ml  Net 66 ml   Filed Weights   06/08/19 0424 06/09/19 0530 06/09/19 1428  Weight: 91.8 kg 89.8 kg 90.9 kg    Examination: General exam: Alert, awake, oriented x 3; still complaining of some shortness of breath on exertion; reports no chest pain, no nausea, no vomiting.  After cardioversion denies any palpitation and is overall feeling better. Rate is controlled and sinus. Respiratory system: Positive scattered rhonchi; decreased breath sounds at the bases.  No frank wheezing or crackles on examination.  Good oxygen saturation on supplementation and is no using accessory muscle. Cardiovascular system: Rate controlled, sinus rhythm, no rubs, no gallops, no JVD. Gastrointestinal system: Abdomen is nondistended, soft and nontender. No  organomegaly or masses felt. Normal bowel sounds heard. Central nervous system: Alert and oriented. No focal neurological deficits. Extremities: No cyanosis or clubbing. Skin: No rashes, no petechiae. Psychiatry: Judgement and insight appear normal. Mood & affect appropriate.     Data Reviewed: I have personally reviewed following labs and imaging studies  CBC: Recent Labs  Lab 06/03/19 1127  WBC 8.0  HGB 13.7  HCT 43.4  MCV 92.9  PLT 234   Basic Metabolic Panel: Recent Labs  Lab 06/03/19 1127 06/04/19 0417 06/05/19 0414 06/06/19 0442 06/07/19 0409 06/08/19 0345 06/09/19 0755  NA 136   < > 136 137 135 136 136  K 4.1   < > 4.0 4.0 4.6 3.8 3.8  CL 93*   < > 94* 94* 94* 92* 94*  CO2 30   < > 29 31 29  33* 31  GLUCOSE 133*   < > 134* 149* 134* 119* 126*  BUN 41*   < > 37* 35* 37* 35* 38*  CREATININE 1.60*   < > 1.39* 1.19* 1.20* 1.23* 1.41*  CALCIUM 8.9   < > 8.7* 9.2 9.2 9.2 8.9  MG 2.2  --   --   --   --   --  2.0   < > = values in this interval not displayed.   GFR: Estimated Creatinine Clearance: 31.9 mL/min (A) (by C-G formula based on SCr of 1.41 mg/dL (H)).  Coagulation Profile: Recent Labs  Lab 06/03/19 1127  INR 1.2    Recent Results (from the past 240 hour(s))  SARS CORONAVIRUS 2 (TAT 6-24 HRS)  Nasopharyngeal Nasopharyngeal Swab     Status: None   Collection Time: 06/03/19 12:58 PM   Specimen: Nasopharyngeal Swab  Result Value Ref Range Status   SARS Coronavirus 2 NEGATIVE NEGATIVE Final    Comment: (NOTE) SARS-CoV-2 target nucleic acids are NOT DETECTED. The SARS-CoV-2 RNA is generally detectable in upper and lower respiratory specimens during the acute phase of infection. Negative results do not preclude SARS-CoV-2 infection, do not rule out co-infections with other pathogens, and should not be used as the sole basis for treatment or other patient management decisions. Negative results must be combined with clinical observations, patient history,  and epidemiological information. The expected result is Negative. Fact Sheet for Patients: HairSlick.no Fact Sheet for Healthcare Providers: quierodirigir.com This test is not yet approved or cleared by the [place] FDA and  has been authorized for detection and/or diagnosis of SARS-CoV-2 by FDA under an Emergency Use Authorization (EUA). This EUA will remain  in effect (meaning this test can be used) for the duration of the COVID-19 declaration under Section 56 4(b)(1) of the Act, 21 U.S.C. section 360bbb-3(b)(1), unless the authorization is terminated or revoked sooner. Performed at [facility], [address], [place], [place] [address]   Respiratory Panel by RT PCR (Flu A&B, Covid) - Nasopharyngeal Swab     Status: None   Collection Time: 06/03/19  2:59 PM   Specimen: Nasopharyngeal Swab  Result Value Ref Range Status   SARS Coronavirus 2 by RT PCR NEGATIVE NEGATIVE Final    Comment: (NOTE) SARS-CoV-2 target nucleic acids are NOT DETECTED. The SARS-CoV-2 RNA is generally detectable in upper respiratoy specimens during the acute phase of infection. The lowest concentration of SARS-CoV-2 viral copies this assay can detect is 131 copies/mL. A negative result does not preclude SARS-Cov-2 infection and should not be used as the sole basis for treatment or other patient management decisions. A negative result may occur with  improper specimen collection/handling, submission of specimen other than nasopharyngeal swab, presence of viral mutation(s) within the areas targeted by this assay, and inadequate number of viral copies (<131 copies/mL). A negative result must be combined with clinical observations, patient history, and epidemiological information. The expected result is Negative. Fact Sheet for Patients:  https://www.moore.com/ Fact Sheet for Healthcare Providers:    https://www.young.biz/ This test is not yet ap proved or cleared by the [place] FDA and  has been authorized for detection and/or diagnosis of SARS-CoV-2 by FDA under an Emergency Use Authorization (EUA). This EUA will remain  in effect (meaning this test can be used) for the duration of the COVID-19 declaration under Section 564(b)(1) of the Act, 21 U.S.C. section 360bbb-3(b)(1), unless the authorization is terminated or revoked sooner.    Influenza A by PCR NEGATIVE NEGATIVE Final   Influenza B by PCR NEGATIVE NEGATIVE Final    Comment: (NOTE) The Xpert Xpress SARS-CoV-2/FLU/RSV assay is intended as an aid in  the diagnosis of influenza from Nasopharyngeal swab specimens and  should not be used as a sole basis for treatment. Nasal washings and  aspirates are unacceptable for Xpert Xpress SARS-CoV-2/FLU/RSV  testing. Fact Sheet for Patients: https://www.moore.com/ Fact Sheet for Healthcare Providers: https://www.young.biz/ This test is not yet approved or cleared by the [place] FDA and  has been authorized for detection and/or diagnosis of SARS-CoV-2 by  FDA under an Emergency Use Authorization (EUA). This EUA will remain  in effect (meaning this test can be used) for the duration of the  Covid-19 declaration  under Section 564(b)(1) of the Act, 21  U.S.C. section 360bbb-3(b)(1), unless the authorization is  terminated or revoked. Performed at [facility], [address], [place], [place] [address]   MRSA PCR Screening     Status: None   Collection Time: 06/03/19  5:03 PM   Specimen: Nasal Mucosa; Nasopharyngeal  Result Value Ref Range Status   MRSA by PCR NEGATIVE NEGATIVE Final    Comment:        The GeneXpert MRSA Assay (FDA approved for NASAL specimens only), is one component of a comprehensive MRSA colonization surveillance program. It is not intended to diagnose MRSA infection nor to guide  or monitor treatment for MRSA infections. Performed at [facility], [address] [place], [place] [address]      Radiology Studies: No results found.  Scheduled Meds: . amiodarone  200 mg Oral BID  . apixaban  5 mg Oral BID  . arformoterol  15 mcg Nebulization BID  . budesonide (PULMICORT) nebulizer solution  0.5 mg Nebulization BID  . Chlorhexidine Gluconate Cloth  6 each Topical Daily  . cholecalciferol  2,000 Units Oral Daily  . docusate sodium  100 mg Oral BID  . levothyroxine  112.5 mcg Oral Once per day on Mon Thu  . levothyroxine  75 mcg Oral Once per day on Sun Tue Wed Fri Sat  . mouth rinse  15 mL Mouth Rinse BID  . metoprolol succinate  50 mg Oral BID  . multivitamin-lutein  1 capsule Oral BID  . polyethylene glycol  17 g Oral Daily  . sodium chloride flush  3 mL Intravenous Q12H  . umeclidinium bromide  1 puff Inhalation Daily   Continuous Infusions: . sodium chloride       LOS: 5 days    Time spent: 35 minutes.     Vassie Loll, MD [facility] Pager [phone]   06/09/2019, 4:17 PM

## 2019-06-09 NOTE — TOC Transition Note (Signed)
Transition of Care [facility]) - CM/SW Discharge Note   Patient Details  Name: Julie Burns MRN: [id number] Date of Birth: 19-May-1930  Transition of [facility]) CM/SW Contact:  Imoni Kohen, Chrystine Oiler, RN Phone Number: 06/09/2019, 11:50 AM   Clinical Narrative:   Patient qualifies for oxygen at 4 liters. Discussed with Ashly of [employer], portable tank will be delivered to patient's room today, can not have POC at this time but can be evaluated at a later time to assess patient's capability to decrease liter flow to 2 liters and possibly getting a concentrator at that time.   Patient declines home health, has caregivers and RN available to her through [facility].    Final next level of care: Home w Home Health Services Barriers to Discharge: Continued Medical Work up   Patient Goals and CMS Choice Patient states their goals for this hospitalization and ongoing recovery are:: family expresses that their goal is for patient to get well before discharging home CMS Medicare.gov Compare Post Acute Care list provided to:: Patient Represenative (must comment)(Roberta [place]) Choice offered to / list presented to : Adult Children       Discharge Plan and Services In-house Referral: Clinical Social Work Discharge Planning Services: CM Consult Post Acute Care Choice: Home Health            DME Agency: [employer] Date DME Agency Contacted: 06/03/19 Time DME Agency Contacted: [phone] Representative spoke with at DME Agency: Marchelle Folks            Social Determinants of Health (SDOH) Interventions     Readmission Risk Interventions No flowsheet data found.

## 2019-06-09 NOTE — Telephone Encounter (Signed)
Pt still admitted.  Called [facility] and spoke with Tiffany. She states they only have one type of POC ([employer] Style) and they typically do not walk pt's on their POC to see if they can tolerate it. The POC at [facility] is currently in stock. Elmarie Shiley is requesting we call back on 4/8 when Marchelle Folks is back in office to see if this can be done to assure pt is receiving adequate oxygen with the POC. Will call [employer] back on 4/8 to speak with Marchelle Folks.

## 2019-06-09 NOTE — Progress Notes (Signed)
SATURATION QUALIFICATIONS: (This note is used to comply with regulatory documentation for home oxygen)  Patient Saturations on Room Air at Rest = 88%  Patient Saturations on Room Air while Ambulating = 85%  Patient Saturations on 4 Liters of oxygen while Ambulating = 93%  Please briefly explain why patient needs home oxygen:Unable to maintain O2 saturation >90% at rest and ambulatory without oxygen

## 2019-06-09 NOTE — Progress Notes (Signed)
Pt eating

## 2019-06-09 NOTE — Progress Notes (Signed)
Progress Note  Patient Name: Julie Burns Date of Encounter: 06/09/2019  Primary Cardiologist: Minus Breeding, MD   Subjective   SOB improving  Inpatient Medications    Scheduled Meds: . amiodarone  200 mg Oral BID  . apixaban  5 mg Oral BID  . arformoterol  15 mcg Nebulization BID  . budesonide (PULMICORT) nebulizer solution  0.5 mg Nebulization BID  . Chlorhexidine Gluconate Cloth  6 each Topical Daily  . cholecalciferol  2,000 Units Oral Daily  . docusate sodium  100 mg Oral BID  . furosemide  40 mg Intravenous Daily  . levothyroxine  112.5 mcg Oral Once per day on Mon Thu  . levothyroxine  75 mcg Oral Once per day on Sun Tue Wed Fri Sat  . mouth rinse  15 mL Mouth Rinse BID  . metoprolol succinate  50 mg Oral BID  . multivitamin-lutein  1 capsule Oral BID  . polyethylene glycol  17 g Oral Daily  . sodium chloride flush  3 mL Intravenous Q12H  . umeclidinium bromide  1 puff Inhalation Daily   Continuous Infusions: . sodium chloride     PRN Meds: sodium chloride, acetaminophen, benzonatate, bisacodyl, levalbuterol, LORazepam, melatonin, nitroGLYCERIN, ondansetron (ZOFRAN) IV, ondansetron, phenol, sodium chloride flush, traMADol   Vital Signs    Vitals:   06/09/19 0630 06/09/19 0700 06/09/19 0722 06/09/19 0800  BP: (!) 101/50 (!) 91/58  (!) 147/62  Pulse: (!) 53 (!) 58 60 (!) 57  Resp: (!) 29 (!) 29 (!) 22 15  Temp:   97.6 F (36.4 C)   TempSrc:   Oral   SpO2: 92% 93% 92% 92%  Weight:      Height:        Intake/Output Summary (Last 24 hours) at 06/09/2019 0858 Last data filed at 06/09/2019 0405 Gross per 24 hour  Intake 866 ml  Output 500 ml  Net 366 ml   Last 3 Weights 06/09/2019 06/08/2019 06/07/2019  Weight (lbs) 197 lb 15.6 oz 202 lb 6.1 oz 200 lb 13.4 oz  Weight (kg) 89.8 kg 91.8 kg 91.1 kg      Telemetry    SR - Personally Reviewed  ECG    n/a - Personally Reviewed  Physical Exam   GEN: No acute distress.   Neck: No JVD Cardiac: RRR, no  murmurs, rubs, or gallops.  Respiratory: bilateral wheezing GI: Soft, nontender, non-distended  MS: 1+ bilateral LE edema Neuro:  Nonfocal  Psych: Normal affect   Labs    High Sensitivity Troponin:   Recent Labs  Lab 06/03/19 1127 06/03/19 1312  TROPONINIHS 27* 28*      Chemistry Recent Labs  Lab 06/07/19 0409 06/08/19 0345 06/09/19 0755  NA 135 136 136  K 4.6 3.8 3.8  CL 94* 92* 94*  CO2 29 33* 31  GLUCOSE 134* 119* 126*  BUN 37* 35* 38*  CREATININE 1.20* 1.23* 1.41*  CALCIUM 9.2 9.2 8.9  GFRNONAA 41* 39* 33*  GFRAA 47* 46* 39*  ANIONGAP 12 11 11      Hematology Recent Labs  Lab 06/03/19 1127  WBC 8.0  RBC 4.67  HGB 13.7  HCT 43.4  MCV 92.9  MCH 29.3  MCHC 31.6  RDW 14.7  PLT 234    BNP Recent Labs  Lab 06/03/19 1127  BNP 1,086.0*     DDimer No results for input(s): DDIMER in the last 168 hours.   Radiology    No results found.  Cardiac Studies  Patient Profile     86 y.o. female w/ PMH ofchronic diastolic CHF,s/p aortic valve replacement in 2010 and 2014, CAD (s/p DES to RCA and staged PCI of LAD and LCx in 2015), paroxysmal atrial fibrillation (s/p DCCV in 2017), carotid artery stenosis (s/p L CEA), HTN, HLD, COPD and severe pulmonary HTNwho is currently admitted for an acute CHF exacerbation and atrial fibrillation with RVR.  Assessment & Plan    1 Afib with RVR - she has a known history of paroxysmal atrial fibrillation but her last known episode was in 2017.  - on home amio. Reloaded with IV amio this admit, failed to convert and had DCCV yesterday which was succesful, remains in SR today.  - continue amio 200mg  bid x 4 weeks, then 200mg  daily - continue eliquis  2. Acute on chronic diastolic HF - exacerbated by afib with RVR - 04/2019 echo LVE 60-65%, mod TR, severe pulm HTN, normal RV function - I/Os incomplete this admissoin. Uptrend in Cr, d/c IV lasix - would resume oral diuretic tomorrow likely pending renal function,  likely would start torsemide 20 or 40 mg daily.    3. History of prosthetic AV   4. COPD exacerbation - per primary team   Would monitor at least one more night,f/u renal function and get started on oral diuretic regimen tomorrow. Still somewhat SOB. Tele bed would be fine.      For questions or updates, please contact [facility] Please consult [email or web address] for contact info under        Signed, , MD  06/09/2019, 8:58 AM

## 2019-06-10 ENCOUNTER — Inpatient Hospital Stay (HOSPITAL_COMMUNITY): Payer: Medicare Other

## 2019-06-10 LAB — CBC
HCT: 39.2 % (ref 36.0–46.0)
Hemoglobin: 12 g/dL (ref 12.0–15.0)
MCH: 28.4 pg (ref 26.0–34.0)
MCHC: 30.6 g/dL (ref 30.0–36.0)
MCV: 92.9 fL (ref 80.0–100.0)
Platelets: 178 10*3/uL (ref 150–400)
RBC: 4.22 MIL/uL (ref 3.87–5.11)
RDW: 14.6 % (ref 11.5–15.5)
WBC: 8.8 10*3/uL (ref 4.0–10.5)
nRBC: 0 % (ref 0.0–0.2)

## 2019-06-10 LAB — BASIC METABOLIC PANEL
Anion gap: 11 (ref 5–15)
BUN: 32 mg/dL — ABNORMAL HIGH (ref 8–23)
CO2: 31 mmol/L (ref 22–32)
Calcium: 9.1 mg/dL (ref 8.9–10.3)
Chloride: 94 mmol/L — ABNORMAL LOW (ref 98–111)
Creatinine, Ser: 1.17 mg/dL — ABNORMAL HIGH (ref 0.44–1.00)
GFR calc Af Amer: 49 mL/min — ABNORMAL LOW (ref >60)
GFR calc non Af Amer: 42 mL/min — ABNORMAL LOW (ref >60)
Glucose, Bld: 121 mg/dL — ABNORMAL HIGH (ref 70–99)
Potassium: 3.8 mmol/L (ref 3.5–5.1)
Sodium: 136 mmol/L (ref 135–145)

## 2019-06-10 LAB — BRAIN NATRIURETIC PEPTIDE: B Natriuretic Peptide: 643 pg/mL — ABNORMAL HIGH (ref 0.0–100.0)

## 2019-06-10 MED ORDER — MELATONIN 3 MG PO TABS: 3.0000 mg | ORAL_TABLET | Freq: Every evening | ORAL | Status: DC | PRN

## 2019-06-10 MED ORDER — TORSEMIDE 20 MG PO TABS
40.0000 mg | ORAL_TABLET | Freq: Every day | ORAL | Status: DC
  Administered 2019-06-11: 40 mg via ORAL
  Filled 2019-06-10: qty 2

## 2019-06-10 MED ORDER — HYDROXYZINE HCL 10 MG PO TABS
10.0000 mg | ORAL_TABLET | Freq: Every evening | ORAL | Status: DC | PRN
  Administered 2019-06-10: 10 mg via ORAL
  Filled 2019-06-10: qty 1

## 2019-06-10 NOTE — Progress Notes (Signed)
Pt stated she was nauseous and generally gets that way after eating. Gave PRN medication (see mar). Pt resting in chair, talking on telephone. Will reassess

## 2019-06-10 NOTE — Progress Notes (Addendum)
PROGRESS NOTE    Julie Burns  UUE:[id number] DOB: [birth year] DOA: 06/03/2019 PCP: Kirstie Peri, MD     Brief Narrative:  86 y.o. female with PMH significant for a. Fib, chronic resp failure, diastolic HF, HTN, HLD, hypothyroidism, anxiety, type 2 diabetes and CKD stage 3B; who presented to ED complaining of general malaise, fatigue and worsening breathing. Patient expressed SOB has been ongoing for the last 4 days or so, she was seen at [facility] ED and kept overnight, without significant changes in her symptoms or clear explanation of what was causing her to be more SOB. Despite some extra lasix at home she has continue experiencing worsening in her breathing and as found hypoxic by her caregiver.  No frank CP, but expressed feeling not well, patient denies nausea, vomiting, dysuria, hematuria, focal neurologic deficits, headaches or any other complaints.  In ED was found to be in a. Fib with RVR, elevated BNP and CXR demonstrating vascular congestion and interstitial edema; IV lopressor and IV lasix given in ED, patient oxygen also bump to 4L to maintain O2 sat above 90%. TRH consulted to place patient in the hospital for further evaluation and management of her a. Fib with RVR and CHF exacerbation. Cardiology service was consulted.    Assessment & Plan: 1-Acute on chronic diastolic heart failure -In the setting of A. fib with RVR -Continue adjusted dose of metoprolol and oral amiodarone.  Status post cardioversion on 06/08/2019.   -Continue holding ACE inhibitor/ARB in the setting of acute exacerbation and the presence of acute on chronic renal failure. -Continue to follow daily weights, strict I's and O's and low-sodium diet. -Cardiology service is on board and will follow recommendations. -Recent echocardiogram demonstrating preserved ejection fraction, diastolic dysfunction and signs of pulmonary hypertension.  2-acute on chronic respiratory failure with hypoxia: In the setting of COPD  -Appears to be secondary to problem #1 -Pulmonary hypertension and underlying COPD contributing -Continue bronchodilators and nebulizer management -Follow clinical response to above treatment and wean down oxygen supplementation to baseline as tolerated. --Patient's oxygen requirement is above at baseline currently requiring  about 4 L of oxygen,  3-A. fib with RVR: Chronic/longstanding paroxysmal atrial fibrillation. -Status post cardioversion, -Continue adjusted dose of metoprolol And amiodarone -Heart rate/rhythm remained difficult to control and on 06/08/2019 ended requiring cardioversion. -Currently sinus and with stable rate.  -Continue Eliquis for secondary prevention -Follow cardiology service recommendations.  4-acute on chronic stage III renal failure -Patient stage IIIb at baseline -Renal function improved after backing off IV diuretics. -Creatinine improved to 1.1;  5-anxiety -Overall stable mood -Continue as needed lorazepam.  6-hypothyroidism -TSH within normal limits -Continue Synthroid  7-chronic pain syndrome -Overall stable -Continue Tylenol and as needed tramadol.  8-constipation -continue using miralax and Colace. -Patient has accomplish a bowel movement and denies any abdominal discomfort currently. -Tolerating diet without problems, no nausea, no vomiting.  DVT prophylaxis: Chronically on Eliquis. Code Status: DNR Family Communication: No family at bedside.  Disposition Plan: Patient From: Home D/C Place: Home with home health therapy  barriers:----Patient's oxygen requirement is above at baseline currently requiring  about 4 L of oxygen, patient also complains of dyspnea on exertion -Patient requesting discharge home be held until 06/11/2019 due to being symptomatic at this time -Patient lives at home at home when she is legally blind  Consultants:   Cardiology service  Procedures:   See below for x-ray reports.  Antimicrobials:   Anti-infectives (From admission, onward)   None  Subjective:  -Complains of dyspnea on exertion -Patient worried about going home due to living alone and still having significant dyspnea on exertion and being legally blind currently requiring more oxygen than she usually at baseline  Objective: Vitals:   06/10/19 0500 06/10/19 0546 06/10/19 0831 06/10/19 1425  BP:  132/62  136/65  Pulse:  (!) 51  (!) 56  Resp:  20  17  Temp:  97.7 F (36.5 C)  97.7 F (36.5 C)  TempSrc:  Oral  Oral  SpO2:  97% 97% 96%  Weight: 90.8 kg     Height:        Intake/Output Summary (Last 24 hours) at 06/10/2019 1951 Last data filed at 06/10/2019 1200 Gross per 24 hour  Intake 360 ml  Output -  Net 360 ml   Filed Weights   06/09/19 0530 06/09/19 1428 06/10/19 0500  Weight: 89.8 kg 90.9 kg 90.8 kg    Examination: General exam: Alert, awake, oriented x 3; still complaining of some shortness of breath on exertion;  Eyes-patient is legally blind Respiratory system: Diminished breath sounds with scattered rhonchi bilaterally Cardiovascular system: Rate controlled, sinus rhythm, no rubs, no gallops, no JVD. Gastrointestinal system: Abdomen is nondistended, soft and nontender. . Normal bowel sounds heard. Central nervous system: Alert and oriented. No focal neurological deficits.  Patient with generalized weakness Extremities: No cyanosis or clubbing. Skin: No rashes, no petechiae. Psychiatry: Judgement and insight appear normal. Mood & affect appropriate.     Data Reviewed: I have personally reviewed following labs and imaging studies  CBC: Recent Labs  Lab 06/10/19 0520  WBC 8.8  HGB 12.0  HCT 39.2  MCV 92.9  PLT 440   Basic Metabolic Panel: Recent Labs  Lab 06/06/19 0442 06/07/19 0409 06/08/19 0345 06/09/19 0755 06/10/19 0520  NA 137 135 136 136 136  K 4.0 4.6 3.8 3.8 3.8  CL 94* 94* 92* 94* 94*  CO2 31 29 33* 31 31  GLUCOSE 149* 134* 119* 126* 121*  BUN 35* 37* 35* 38*  32*  CREATININE 1.19* 1.20* 1.23* 1.41* 1.17*  CALCIUM 9.2 9.2 9.2 8.9 9.1  MG  --   --   --  2.0  --    GFR: Estimated Creatinine Clearance: 38.5 mL/min (A) (by C-G formula based on SCr of 1.17 mg/dL (H)).  Coagulation Profile: No results for input(s): INR, PROTIME in the last 168 hours.  Recent Results (from the past 240 hour(s))  SARS CORONAVIRUS 2 (TAT 6-24 HRS) Nasopharyngeal Nasopharyngeal Swab     Status: None   Collection Time: 06/03/19 12:58 PM   Specimen: Nasopharyngeal Swab  Result Value Ref Range Status   SARS Coronavirus 2 NEGATIVE NEGATIVE Final    Comment: (NOTE) SARS-CoV-2 target nucleic acids are NOT DETECTED. The SARS-CoV-2 RNA is generally detectable in upper and lower respiratory specimens during the acute phase of infection. Negative results do not preclude SARS-CoV-2 infection, do not rule out co-infections with other pathogens, and should not be used as the sole basis for treatment or other patient management decisions. Negative results must be combined with clinical observations, patient history, and epidemiological information. The expected result is Negative. Fact Sheet for Patients: SugarRoll.be Fact Sheet for Healthcare Providers: https://www.woods-mathews.com/ This test is not yet approved or cleared by the [place] FDA and  has been authorized for detection and/or diagnosis of SARS-CoV-2 by FDA under an Emergency Use Authorization (EUA). This EUA will remain  in effect (meaning this test can be used) for  the duration of the COVID-19 declaration under Section 56 4(b)(1) of the Act, 21 U.S.C. section 360bbb-3(b)(1), unless the authorization is terminated or revoked sooner. Performed at [facility], [address] [place], [place] [address]   Respiratory Panel by RT PCR (Flu A&B, Covid) - Nasopharyngeal Swab     Status: None   Collection Time: 06/03/19  2:59 PM   Specimen: Nasopharyngeal Swab   Result Value Ref Range Status   SARS Coronavirus 2 by RT PCR NEGATIVE NEGATIVE Final    Comment: (NOTE) SARS-CoV-2 target nucleic acids are NOT DETECTED. The SARS-CoV-2 RNA is generally detectable in upper respiratoy specimens during the acute phase of infection. The lowest concentration of SARS-CoV-2 viral copies this assay can detect is 131 copies/mL. A negative result does not preclude SARS-Cov-2 infection and should not be used as the sole basis for treatment or other patient management decisions. A negative result may occur with  improper specimen collection/handling, submission of specimen other than nasopharyngeal swab, presence of viral mutation(s) within the areas targeted by this assay, and inadequate number of viral copies (<131 copies/mL). A negative result must be combined with clinical observations, patient history, and epidemiological information. The expected result is Negative. Fact Sheet for Patients:  https://www.moore.com/ Fact Sheet for Healthcare Providers:  https://www.young.biz/ This test is not yet ap proved or cleared by the [place] FDA and  has been authorized for detection and/or diagnosis of SARS-CoV-2 by FDA under an Emergency Use Authorization (EUA). This EUA will remain  in effect (meaning this test can be used) for the duration of the COVID-19 declaration under Section 564(b)(1) of the Act, 21 U.S.C. section 360bbb-3(b)(1), unless the authorization is terminated or revoked sooner.    Influenza A by PCR NEGATIVE NEGATIVE Final   Influenza B by PCR NEGATIVE NEGATIVE Final    Comment: (NOTE) The Xpert Xpress SARS-CoV-2/FLU/RSV assay is intended as an aid in  the diagnosis of influenza from Nasopharyngeal swab specimens and  should not be used as a sole basis for treatment. Nasal washings and  aspirates are unacceptable for Xpert Xpress SARS-CoV-2/FLU/RSV  testing. Fact Sheet for Patients:  https://www.moore.com/ Fact Sheet for Healthcare Providers: https://www.young.biz/ This test is not yet approved or cleared by the [place] FDA and  has been authorized for detection and/or diagnosis of SARS-CoV-2 by  FDA under an Emergency Use Authorization (EUA). This EUA will remain  in effect (meaning this test can be used) for the duration of the  Covid-19 declaration under Section 564(b)(1) of the Act, 21  U.S.C. section 360bbb-3(b)(1), unless the authorization is  terminated or revoked. Performed at [facility], [address] [place], [place] [address]   MRSA PCR Screening     Status: None   Collection Time: 06/03/19  5:03 PM   Specimen: Nasal Mucosa; Nasopharyngeal  Result Value Ref Range Status   MRSA by PCR NEGATIVE NEGATIVE Final    Comment:        The GeneXpert MRSA Assay (FDA approved for NASAL specimens only), is one component of a comprehensive MRSA colonization surveillance program. It is not intended to diagnose MRSA infection nor to guide or monitor treatment for MRSA infections. Performed at [facility], [address], [place], [place] [address]      Radiology Studies: [facility] Chest [facility] 1 View  Result Date: 06/10/2019 CLINICAL DATA:  Shortness of breath and difficulty swallowing. EXAM: PORTABLE CHEST 1 VIEW COMPARISON:  06/03/2019.  05/18/2017. FINDINGS: Previous median sternotomy and CABG. Chronic cardiomegaly. Chronic  pleural scarring. Cannot rule out the presence of some pleural fluid. Venous hypertension but without frank edema. IMPRESSION: Previous CABG. Chronic lung disease. Chronic pleural blunting consistent with pleural scarring seen on multiple previous studies. Cannot rule out the presence of some pleural fluid. Pulmonary venous hypertension but without frank edema. Electronically Signed   By: Paulina Fusi M.D.   On: 06/10/2019 13:20    Scheduled Meds: . amiodarone  200 mg Oral BID  . apixaban  5 mg Oral  BID  . arformoterol  15 mcg Nebulization BID  . budesonide (PULMICORT) nebulizer solution  0.5 mg Nebulization BID  . cholecalciferol  2,000 Units Oral Daily  . docusate sodium  100 mg Oral BID  . levothyroxine  112.5 mcg Oral Once per day on Mon Thu  . levothyroxine  75 mcg Oral Once per day on Sun Tue Wed Fri Sat  . metoprolol succinate  50 mg Oral BID  . multivitamin-lutein  1 capsule Oral BID  . polyethylene glycol  17 g Oral Daily  . sodium chloride flush  3 mL Intravenous Q12H  . [START ON 06/11/2019] torsemide  40 mg Oral Daily  . umeclidinium bromide  1 puff Inhalation Daily   Continuous Infusions: . sodium chloride       LOS: 6 days    Time spent: 35 minutes.     Shon Hale, MD [facility] Pager [phone]   06/10/2019, 7:51 PM

## 2019-06-10 NOTE — Plan of Care (Signed)
  Problem: Acute Rehab PT Goals(only PT should resolve) Goal: Pt Will Go Supine/Side To Sit Outcome: Progressing Flowsheets (Taken 06/10/2019 1425) Pt will go Supine/Side to Sit: with modified independence Goal: Patient Will Transfer Sit To/From Stand Outcome: Progressing Flowsheets (Taken 06/10/2019 1425) Patient will transfer sit to/from stand: with supervision Goal: Pt Will Transfer Bed To Chair/Chair To Bed Outcome: Progressing Flowsheets (Taken 06/10/2019 1425) Pt will Transfer Bed to Chair/Chair to Bed:  with supervision  min guard assist Goal: Pt Will Ambulate Outcome: Progressing Flowsheets (Taken 06/10/2019 1425) Pt will Ambulate:  with supervision  75 feet  with rolling walker   2:26 PM, 06/10/19 Ocie Bob, MPT Physical Therapist with [facility] 336 [phone] office [phone] mobile phone

## 2019-06-10 NOTE — Progress Notes (Signed)
Progress Note  Patient Name: Julie Burns Date of Encounter: 06/10/2019  Primary Cardiologist: Rollene Rotunda, MD   Subjective   SOB improving.   Inpatient Medications    Scheduled Meds: . amiodarone  200 mg Oral BID  . apixaban  5 mg Oral BID  . arformoterol  15 mcg Nebulization BID  . budesonide (PULMICORT) nebulizer solution  0.5 mg Nebulization BID  . Chlorhexidine Gluconate Cloth  6 each Topical Daily  . cholecalciferol  2,000 Units Oral Daily  . docusate sodium  100 mg Oral BID  . levothyroxine  112.5 mcg Oral Once per day on Mon Thu  . levothyroxine  75 mcg Oral Once per day on Sun Tue Wed Fri Sat  . mouth rinse  15 mL Mouth Rinse BID  . metoprolol succinate  50 mg Oral BID  . multivitamin-lutein  1 capsule Oral BID  . polyethylene glycol  17 g Oral Daily  . sodium chloride flush  3 mL Intravenous Q12H  . umeclidinium bromide  1 puff Inhalation Daily   Continuous Infusions: . sodium chloride     PRN Meds: sodium chloride, acetaminophen, benzonatate, bisacodyl, levalbuterol, LORazepam, melatonin, nitroGLYCERIN, ondansetron (ZOFRAN) IV, ondansetron, phenol, sodium chloride flush, traMADol   Vital Signs    Vitals:   06/10/19 0207 06/10/19 0500 06/10/19 0546 06/10/19 0831  BP: (!) 161/84  132/62   Pulse: (!) 54  (!) 51   Resp: 20  20   Temp: (!) 97.5 F (36.4 C)  97.7 F (36.5 C)   TempSrc: Oral  Oral   SpO2: 97%  97% 97%  Weight:  90.8 kg    Height:        Intake/Output Summary (Last 24 hours) at 06/10/2019 0837 Last data filed at 06/09/2019 1758 Gross per 24 hour  Intake 240 ml  Output 300 ml  Net -60 ml   Last 3 Weights 06/10/2019 06/09/2019 06/09/2019  Weight (lbs) 200 lb 2.8 oz 200 lb 6.4 oz 197 lb 15.6 oz  Weight (kg) 90.8 kg 90.9 kg 89.8 kg      Telemetry    SR and sinus brady - Personally Reviewed  ECG    n/a - Personally Reviewed  Physical Exam   GEN: No acute distress.   Neck: No JVD Cardiac: RRR, no murmurs, rubs, or gallops.    Respiratory: Clear to auscultation bilaterally. GI: Soft, nontender, non-distended  MS: No edema; No deformity. Neuro:  Nonfocal  Psych: Normal affect   Labs    High Sensitivity Troponin:   Recent Labs  Lab 06/03/19 1127 06/03/19 1312  TROPONINIHS 27* 28*      Chemistry Recent Labs  Lab 06/07/19 0409 06/08/19 0345 06/09/19 0755  NA 135 136 136  K 4.6 3.8 3.8  CL 94* 92* 94*  CO2 29 33* 31  GLUCOSE 134* 119* 126*  BUN 37* 35* 38*  CREATININE 1.20* 1.23* 1.41*  CALCIUM 9.2 9.2 8.9  GFRNONAA 41* 39* 33*  GFRAA 47* 46* 39*  ANIONGAP 12 11 11      Hematology Recent Labs  Lab 06/03/19 1127 06/10/19 0520  WBC 8.0 8.8  RBC 4.67 4.22  HGB 13.7 12.0  HCT 43.4 39.2  MCV 92.9 92.9  MCH 29.3 28.4  MCHC 31.6 30.6  RDW 14.7 14.6  PLT 234 178    BNP Recent Labs  Lab 06/03/19 1127  BNP 1,086.0*     DDimer No results for input(s): DDIMER in the last 168 hours.   Radiology  No results found.  Cardiac Studies     Patient Profile     86 y.o.femalew/ PMH ofchronic diastolic CHF,s/p aortic valve replacement in 2010 and 2014, CAD (s/p DES to RCA and staged PCI of LAD and LCx in 2015), paroxysmal atrial fibrillation (s/p DCCV in 2017), carotid artery stenosis (s/p L CEA), HTN, HLD, COPD and severe pulmonary HTNwho is currently admitted for an acute CHF exacerbation and atrial fibrillation with RVR.  Assessment & Plan    1 Afib with RVR - she has a known history of paroxysmal atrial fibrillation but her last known episode was in 2017.  - on home amio. Reloaded with IV amio this admit, failed to convert and had DCCV yesterday which was succesful, remains in SR today.  - continue amio 200mg  bid x 4 weeks, then 200mg  daily SHe remains on toprol 50mg  bid - continue eliquis  2. Acute on chronic diastolic HF - exacerbated by afib with RVR - 04/2019 echo LVE 60-65%, mod TR, severe pulm HTN, normal RV function - I/Os incomplete this admissoin. Uptrend in Cr,  we stopped IV lasix.  - f/u labs from this AM, pending results would likely start torsemide 40mg  daily. If Cr remains elevated would hold oral diuretic and likely start tomorrow.   - she is on home O2 3L chronically. Nearing her baseline respiratory status. Will f/u BMET, BNP, and pcxr this AM.     3. History of prosthetic AV   4. COPD exacerbation - per primary team   Would monitor at least one more night,f/u renal function and get started on oral diuretic regimen tomorrow. Still somewhat SOB. Tele bed would be fine.   For questions or updates, please contact [place] Please consult [email or web address] for contact info under        Signed, Carlyle Dolly, MD  06/10/2019, 8:37 AM

## 2019-06-10 NOTE — TOC Progression Note (Addendum)
Transition of Care [facility]) - Progression Note    Patient Details  Name: SANTRESA LEVETT MRN: [id number] Date of Birth: 26-Sep-1930  [facility] CM/SW Contact  Younes Degeorge, Chrystine Oiler, RN Phone Number: 06/10/2019, 3:42 PM  Clinical Narrative:   Recommended for home health PT. Patient agreeable. Offered choice, patient has no preference. Referred to [facility].       Expected Discharge Plan: Home w Home Health Services Barriers to Discharge: Continued Medical Work up  Expected Discharge Plan and Services Expected Discharge Plan: Home w Home Health Services In-house Referral: Clinical Social Work Discharge Planning Services: CM Consult Post Acute Care Choice: Home Health Living arrangements for the past 2 months: Single Family Home                   DME Agency: Patsy Lager Date DME Agency Contacted: 06/03/19 Time DME Agency Contacted: [phone] Representative spoke with at DME Agency: Marchelle Folks             Social Determinants of Health (SDOH) Interventions    Readmission Risk Interventions No flowsheet data found.

## 2019-06-10 NOTE — Plan of Care (Signed)
  Problem: Education: Goal: Knowledge of General Education information will improve Description: Including pain rating scale, medication(s)/side effects and non-pharmacologic comfort measures Outcome: Progressing   Problem: Health Behavior/Discharge Planning: Goal: Ability to manage health-related needs will improve Outcome: Progressing   Problem: Clinical Measurements: Goal: Ability to maintain clinical measurements within normal limits will improve Outcome: Progressing Goal: Will remain free from infection Outcome: Progressing Goal: Diagnostic test results will improve Outcome: Progressing Goal: Respiratory complications will improve Outcome: Progressing Goal: Cardiovascular complication will be avoided Outcome: Progressing   Problem: Activity: Goal: Risk for activity intolerance will decrease Outcome: Progressing   Problem: Elimination: Goal: Will not experience complications related to bowel motility Outcome: Progressing   Problem: Safety: Goal: Ability to remain free from injury will improve Outcome: Progressing   Problem: Skin Integrity: Goal: Risk for impaired skin integrity will decrease Outcome: Progressing   Problem: Education: Goal: Knowledge of disease or condition will improve Outcome: Progressing Goal: Understanding of medication regimen will improve Outcome: Progressing Goal: Individualized Educational Video(s) Outcome: Progressing   Problem: Health Behavior/Discharge Planning: Goal: Ability to safely manage health-related needs after discharge will improve Outcome: Progressing

## 2019-06-10 NOTE — Care Management Important Message (Signed)
Important Message  Patient Details  Name: Julie Burns MRN: [id number] Date of Birth: 19-Sep-1930   Medicare Important Message Given:  Yes     Corey Harold 06/10/2019, 11:33 AM

## 2019-06-10 NOTE — Evaluation (Signed)
Physical Therapy Evaluation Patient Details Name: Julie Burns MRN: [id number] DOB: 1930/06/03 Today's Date: 06/10/2019   History of Present Illness  Julie Burns is a 86 y.o. female with PMH significant for a. Fib, chronic resp failure, diastolic HF, HTN, HLD, hypothyroidism, anxiety, type 2 diabetes and CKD stage 3B; who presented to ED complaining of general malaise, fatigue and worsening breathing. Patient expressed SOB has been ongoing for the last 4 days or so, she was seen at [facility] ED and kept overnight, without significant changes in her symptoms or clear explanation of what was causing her to be more SOB. Despite some extra lasix at home she has continue experiencing worsening in her breathing and as found hypoxic by her caregiver. No frank CP, but expressed feeling not well, patient denies nausea, vomiting, dysuria, hematuria, focal neurologic deficits, headaches or any other complaints.    Clinical Impression  Patient functioning near baseline for functional mobility and gait, required assistance mostly due to poor eyesight, ambulated in room/hallways without loss of balance, limited secondary to c/o fatigue and mild SOB with SpO2 at 95% whileon on 4 LPM during ambulation.  Patient will benefit from continued physical therapy in hospital and recommended venue below to increase strength, balance, endurance for safe ADLs and gait.     Follow Up Recommendations Home health PT;Supervision - Intermittent;Supervision for mobility/OOB    Equipment Recommendations  None recommended by PT    Recommendations for Other Services       Precautions / Restrictions Precautions Precautions: Fall Precaution Comments: poor eye sight Restrictions Weight Bearing Restrictions: No      Mobility  Bed Mobility Overal bed mobility: Needs Assistance Bed Mobility: Supine to Sit     Supine to sit: Supervision     General bed mobility comments: increased time, slightly labored  movement  Transfers Overall transfer level: Needs assistance Equipment used: Rolling walker (2 wheeled) Transfers: Sit to/from [facility] Sit to Stand: Min guard Stand pivot transfers: Min guard       General transfer comment: increased time, labored movement  Ambulation/Gait Ambulation/Gait assistance: Min guard Gait Distance (Feet): 55 Feet Assistive device: Rolling walker (2 wheeled) Gait Pattern/deviations: Decreased step length - right;Decreased step length - left;Decreased stride length;Trunk flexed Gait velocity: decreased   General Gait Details: labored cadence with tendency to lean over RW for support, no loss of balance, limited secondary to fatigue and mild SOB, on 4 LPM with SpO2 at 95%  Stairs            Wheelchair Mobility    Modified Rankin (Stroke Patients Only)       Balance Overall balance assessment: Needs assistance Sitting-balance support: Feet supported;No upper extremity supported Sitting balance-Leahy Scale: Good Sitting balance - Comments: seated at EOB   Standing balance support: During functional activity;Bilateral upper extremity supported Standing balance-Leahy Scale: Fair Standing balance comment: using RW                             Pertinent Vitals/Pain Pain Assessment: Faces Faces Pain Scale: Hurts a little bit Pain Location: stomach Pain Descriptors / Indicators: Aching Pain Intervention(s): Limited activity within patient's tolerance;Monitored during session    [place] expects to be discharged to:: Private residence Living Arrangements: Alone Available Help at Discharge: Personal care attendant Type of Home: Apartment Home Access: Ramped entrance;Stairs to enter Entrance Stairs-Rails: Right;Left;Can reach both Entrance Stairs-Number of Steps: 3 Home Layout: One level Home  Equipment: Walker - 4 wheels;Grab bars - tub/shower;Hand held shower head;Shower seat;Toilet riser       Prior Function Level of Independence: Needs assistance   Gait / Transfers Assistance Needed: Household ambulator with Rollator  ADL's / Homemaking Assistance Needed: home aides 8 hours/day x 5 days/week and 4 hours/day on Sundays        Hand Dominance        Extremity/Trunk Assessment   Upper Extremity Assessment Upper Extremity Assessment: Overall WFL for tasks assessed    Lower Extremity Assessment Lower Extremity Assessment: Generalized weakness    Cervical / Trunk Assessment Cervical / Trunk Assessment: Kyphotic  Communication   Communication: No difficulties  Cognition Arousal/Alertness: Awake/alert Behavior During Therapy: WFL for tasks assessed/performed Overall Cognitive Status: Within Functional Limits for tasks assessed                                        General Comments      Exercises     Assessment/Plan    PT Assessment Patient needs continued PT services  PT Problem List Decreased strength;Decreased balance;Decreased activity tolerance;Decreased mobility       PT Treatment Interventions Gait training;Stair training;Functional mobility training;Therapeutic activities;Therapeutic exercise;Patient/family education    PT Goals (Current goals can be found in the Care Plan section)  Acute Rehab PT Goals Patient Stated Goal: return home with home aides to assist PT Goal Formulation: With patient Time For Goal Achievement: 06/17/19 Potential to Achieve Goals: Good    Frequency Min 3X/week   Barriers to discharge        Co-evaluation               AM-PAC PT "6 Clicks" Mobility  Outcome Measure Help needed turning from your back to your side while in a flat bed without using bedrails?: None Help needed moving from lying on your back to sitting on the side of a flat bed without using bedrails?: None Help needed moving to and from a bed to a chair (including a wheelchair)?: A Little Help needed standing up from a  chair using your arms (e.g., wheelchair or bedside chair)?: A Little Help needed to walk in hospital room?: A Little Help needed climbing 3-5 steps with a railing? : A Little 6 Click Score: 20    End of Session   Activity Tolerance: Patient tolerated treatment well;Patient limited by fatigue Patient left: in chair;with call bell/phone within reach;with chair alarm set Nurse Communication: Mobility status PT Visit Diagnosis: Unsteadiness on feet (R26.81);Other abnormalities of gait and mobility (R26.89);Muscle weakness (generalized) (M62.81)    Time: 2536-6440 PT Time Calculation (min) (ACUTE ONLY): 29 min   Charges:   PT Evaluation $PT Eval Moderate Complexity: 1 Mod PT Treatments $Therapeutic Activity: 23-37 mins        2:24 PM, 06/10/19 Ocie Bob, MPT Physical Therapist with [facility] [phone] office [phone] mobile phone w

## 2019-06-11 LAB — BASIC METABOLIC PANEL
Anion gap: 10 (ref 5–15)
BUN: 28 mg/dL — ABNORMAL HIGH (ref 8–23)
CO2: 33 mmol/L — ABNORMAL HIGH (ref 22–32)
Calcium: 9.2 mg/dL (ref 8.9–10.3)
Chloride: 95 mmol/L — ABNORMAL LOW (ref 98–111)
Creatinine, Ser: 0.95 mg/dL (ref 0.44–1.00)
GFR calc Af Amer: >60 mL/min (ref >60)
GFR calc non Af Amer: 54 mL/min — ABNORMAL LOW (ref >60)
Glucose, Bld: 111 mg/dL — ABNORMAL HIGH (ref 70–99)
Potassium: 3.9 mmol/L (ref 3.5–5.1)
Sodium: 138 mmol/L (ref 135–145)

## 2019-06-11 MED ORDER — ISOSORBIDE MONONITRATE ER 60 MG PO TB24: 60.0000 mg | ORAL_TABLET | Freq: Every day | ORAL | 3 refills | Status: AC

## 2019-06-11 MED ORDER — TORSEMIDE 20 MG PO TABS: 40.0000 mg | ORAL_TABLET | Freq: Every day | ORAL | 3 refills | Status: AC

## 2019-06-11 MED ORDER — PANTOPRAZOLE SODIUM 40 MG PO TBEC: 40.0000 mg | DELAYED_RELEASE_TABLET | Freq: Every day | ORAL | 2 refills | Status: AC

## 2019-06-11 MED ORDER — METOPROLOL SUCCINATE ER 50 MG PO TB24: 50.0000 mg | ORAL_TABLET | Freq: Two times a day (BID) | ORAL | 5 refills | Status: DC

## 2019-06-11 MED ORDER — AMIODARONE HCL 200 MG PO TABS: 200.0000 mg | ORAL_TABLET | ORAL | 3 refills | Status: DC

## 2019-06-11 NOTE — TOC Transition Note (Addendum)
Transition of Care [facility]) - CM/SW Discharge Note   Patient Details  Name: Julie Burns MRN: [id number] Date of Birth: April 20, 1930  Transition [facility]) CM/SW Contact:  Ewing Schlein, LCSW Phone Number: 06/11/2019, 11:41 AM   Clinical Narrative: CSW spoke with Bonita Quin with [facility] to notify her patient will be discharging today. CSW called [facility] to schedule transportation and was informed due to being short-staffed, patient will not be picked up until the evening. CSW called daughter, Lavonna Rua, to discuss transportation and daughter reported she would prefer to pick up the patient from the hospital as she does not want the patient to wait until the evening. Discussed the need to have oxygen set up prior to discharge. Daughter informed CSW Patsy Lager has not called her to set it up.  CSW called Ashly with [facility] to inform him of patient's pending discharge and provided Ashly with daughter's contact information to set up home O2 prior to discharge. TOC signing off.  Final next level of care: Home w Home Health Services Barriers to Discharge: Barriers Resolved  Patient Goals and CMS Choice Patient states their goals for this hospitalization and ongoing recovery are:: Discharge home with [facility] CMS Medicare.gov Compare Post Acute Care list provided to:: Patient Represenative (must comment)([place]ta [place]) Choice offered to / list presented to : Adult Children  Discharge Plan and Services In-house Referral: Clinical Social Work Discharge Planning Services: CM Consult Post Acute Care Choice: Home Health            DME Agency: [facility] Date DME Agency Contacted: 06/03/19 Time DME Agency Contacted: [phone] Representative spoke with at DME Agency: Nance Pear Agency: [facility] Date HH Agency Contacted: 06/11/19 Time HH Agency Contacted: 1101 Representative spoke with at [facility] Agency: Alroy Bailiff: [phone]  Readmission Risk Interventions No flowsheet data found.

## 2019-06-11 NOTE — Progress Notes (Signed)
Telemetry reviewed, remains in SR. Continue amio 200mg  bid x 4 weeks, then 200mg  daily She remains on toprol 50mg  bid. AKI has resolved, we have started torsemide 40mg  daily.   We will sign off inpatient care, we will arrange f/u   MD

## 2019-06-11 NOTE — Discharge Instructions (Signed)
1) you are taking Eliquis/apixaban for blood thinner so Avoid ibuprofen/Advil/Aleve/Motrin/Goody Powders/Naproxen/BC powders/Meloxicam/Diclofenac/Indomethacin and other Nonsteroidal anti-inflammatory medications as these will make you more likely to bleed and can cause stomach ulcers, can also cause Kidney problems.   2)Take amiodarone 200 mg tablet twice a day until 07/08/2019, and then on 07/09/2019 start 200 mg once daily  3) take metoprolol XL/Toprol-XL  50mg  twice daily  4) stop furosemide/Lasix and start Torsemide 40mg  daily  5) repeat CBC and BMP blood test within a week with primary care physician advised  6)Very low-salt diet advised 7)Weigh yourself daily, call if you gain more than 3 pounds in 1 day or more than 5 pounds in 1 week as your diuretic medications may need to be adjusted 8)Limit your Fluid  intake to no more than 60 ounces (1.8 Liters) per day

## 2019-06-11 NOTE — Telephone Encounter (Signed)
Julie Burns states they will be delivering oxygen on 06/11/2019 to patient's home.  Ashly phone number is [phone].

## 2019-06-11 NOTE — Discharge Summary (Signed)
Julie Burns, is a 86 y.o. female  DOB 03-06-1930  MRN [id number].  Admission date:  06/03/2019  Admitting Physician  Barton Dubois, MD  Discharge Date:  06/11/2019   Primary MD  Monico Blitz, MD  Recommendations for primary care physician for things to follow:   1) you are taking Eliquis/apixaban for blood thinner so Avoid ibuprofen/Advil/Aleve/Motrin/Goody Powders/Naproxen/BC powders/Meloxicam/Diclofenac/Indomethacin and other Nonsteroidal anti-inflammatory medications as these will make you more likely to bleed and can cause stomach ulcers, can also cause Kidney problems.   2)Take amiodarone 200 mg tablet twice a day until 07/08/2019, and then on 07/09/2019 start 200 mg once daily  3) take metoprolol XL/Toprol-XL  50mg  twice daily  4) stop furosemide/Lasix and start Torsemide 40mg  daily  5) repeat CBC and BMP blood test within a week with primary care physician advised  6)Very low-salt diet advised 7)Weigh yourself daily, call if you gain more than 3 pounds in 1 day or more than 5 pounds in 1 week as your diuretic medications may need to be adjusted 8)Limit your Fluid  intake to no more than 60 ounces (1.8 Liters) per day   Admission Diagnosis  Atrial fibrillation with rapid ventricular response (HCC) [I48.91] Acute on chronic diastolic CHF (congestive heart failure) (HCC) [I50.33] Acute on chronic congestive heart failure, unspecified heart failure type ([place]) [I50.9] Acute on chronic respiratory failure with hypoxia (HCC) [J96.21]   Discharge Diagnosis  Atrial fibrillation with rapid ventricular response (HCC) [I48.91] Acute on chronic diastolic CHF (congestive heart failure) (HCC) [I50.33] Acute on chronic congestive heart failure, unspecified heart failure type ([place]) [I50.9] Acute on chronic respiratory failure with hypoxia (HCC) [J96.21]   Active Problems:   Acute on chronic respiratory failure  with hypoxia (HCC)   Acute on chronic diastolic CHF (congestive heart failure) ([place])      Past Medical History:  Diagnosis Date  . Age-related osteoporosis without current pathological fracture   . Anxiety disorder   . Atrial fibrillation ([place])   . CAD (coronary artery disease)    12/2013-DES to RCA due to NSTEMI- staged PCI to LAD and CX (not ideal for CABG; Echocardiogram 01/16/10 EF 55%, severe LVH; Moderate AS; mild ot moderate TR  . Chronic kidney disease   . Chronic respiratory failure with hypoxia ([place])   . COPD (chronic obstructive pulmonary disease) ([place])   . Degenerative myopia with macular hole   . Diabetes ([place])    TYPE 2  . Hyperlipidemia   . Hypothyroidism   . Irritable bowel syndrome without diarrhea   . NSTEMI (non-ST elevated myocardial infarction) ([place])    NSTEMI 3/05; s/p PTCA with stenting of left circumflex coronary artery 3/05; totally occluded small vessel OM  . Occlusion and stenosis of right carotid artery    Carotid Duplex 7/13 moderate disease (<70%) of the right internal carotid arteries, this is a high bifurcation, s/p left CEA without significant stenosis.  . Polyp of colon   . Presence of prosthetic heart valve    S/P aortic valve replacement 04/29/2008   .  PVD (peripheral vascular disease) (HCC)   . Sicca syndrome (HCC)   . Stroke (cerebrum) (HCC)   . Unspecified abdominal hernia without obstruction or gangrene   . Vitamin D deficiency     Past Surgical History:  Procedure Laterality Date  . ABDOMINAL HERNIA REPAIR     per patient x 3  . AORTIC VALVE REPLACEMENT     S/P aortic valve replacement 04/29/2008   . CARDIAC CATHETERIZATION     by Flonnie Hailstone at [facility]; mLAD 30%, patent LCx stent, mRCA 50%, LVEDP 10, severe AS with AV mean gradient 46 mmHg, Right heart catheterization   . CARDIOVERSION N/A 06/08/2019   Procedure: CARDIOVERSION;  Surgeon: Antoine Poche, MD;  Location: [facility];  Service: Endoscopy;  Laterality: N/A;  . CAROTID  ENDARTERECTOMY     Carotid Duplex 7/13 moderate disease (<70%) of the right internal carotid arteries, this is a high bifurcation, s/p left CEA without significant stenosis.  . COLONOSCOPY     per patient- several in the past in [facility], couple of polyps, last TCS between 5-10 years ago.   [name] MASTECTOMY Left 2013   breast cancer     HPI  from the history and physical done on the day of admission:    Chief Complaint: SOB and weakness.   HPI: Julie Burns is a 86 y.o. female with PMH significant for a. Fib, chronic resp failure, diastolic HF, HTN, HLD, hypothyroidism, anxiety, type 2 diabetes and CKD stage 3B; who presented to ED complaining of general malaise, fatigue and worsening breathing. Patient expressed SOB has been ongoing for the last 4 days or so, she was seen at [facility] ED and kept overnight, without significant changes in her symptoms or clear explanation of what was causing her to be more SOB. Despite some extra lasix at home she has continue experiencing worsening in her breathing and as found hypoxic by her caregiver.  No frank CP, but expressed feeling not well, patient denies nausea, vomiting, dysuria, hematuria, focal neurologic deficits, headaches or any other complaints.  In ED was found to be in a. Fib with RVR, elevated BNP and CXR demonstrating vascular congestion and interstitial edema; IV lopressor and IV lasix given in ED, patient oxygen also bump to 4L to maintain O2 sat above 90%. TRH consulted to place patient in the hospital for further evaluation and management of her a. Fib with RVR and CHF exacerbation. Cardiology service was consulted.      Hospital Course:     Brief Narrative:  86 y.o.femalewith PMH significant for a. Fib, chronic resp failure, diastolic HF, HTN, HLD, hypothyroidism, anxiety, type 2 diabetes and CKD stage 3B; who presented to ED complaining of general malaise, fatigue and worsening breathing. Patient expressed SOB has been ongoing for  the last 4 days or so, she was seen at [facility] ED and kept overnight, without significant changes in her symptoms or clear explanation of what was causing her to be more SOB. Despite some extra lasix at home she has continue experiencing worsening in her breathing and as found hypoxic by her caregiver.  No frank CP, but expressed feeling not well, patient denies nausea, vomiting, dysuria, hematuria, focal neurologic deficits, headaches or any other complaints.  In ED was found to be in a. Fib with RVR, elevated BNP and CXR demonstrating vascular congestion and interstitial edema; IV lopressor and IV lasix given in ED, patient oxygen also bump to 4L to maintain O2 sat above 90%. TRH consulted to place  patient in the hospital for further evaluation and management of her a. Fib with RVR and CHF exacerbation. Cardiology service was consulted.   Assessment & Plan: 1-Acute on chronic diastolic heart failure -In the setting of A. fib with RVR -Overall improved heart failure after cardioversion and adjustment of diuretics -Amiodarone and metoprolol as outlined in discharge instructions .  Status post cardioversion on 06/08/2019.   -Cardiology consult with input on fluid management/diuretic rx and rate control appreciated -Recent echocardiogram demonstrating preserved ejection fraction, diastolic dysfunction and signs of pulmonary hypertension.  2-acute on chronic respiratory failure with hypoxia: In the setting of COPD -Appears to be secondary to problem #1 -Pulmonary hypertension and underlying COPD contributing -Continue bronchodilators and nebulizer management -Improved hypoxemia, continue home O2 as outlined in discharge instructions  3-A. fib with RVR: Chronic/longstanding paroxysmal atrial fibrillation. -Status post cardioversion, -Heart rate/rhythm remained difficult to control and on 06/08/2019 ended requiring cardioversion. -Currently sinus and with stable rate.  -Continue Eliquis for  secondary prevention -Discharge on p.o. amiodarone and metoprolol as outlined in discharge instructions  4-acute on chronic stage IIIb renal failure  -Renal function improved after backing off IV diuretics. -Creatinine improved to 1.1;  5-anxiety -Overall stable mood -Continue as needed lorazepam.  6-hypothyroidism -TSH within normal limits -Continue Synthroid  7-chronic pain syndrome -Overall stable -Continue Tylenol and as needed tramadol.  8-constipation -Overall much improved -continue using miralax and Colace.  -Tolerating diet without problems, no nausea, no vomiting.  DVT prophylaxis: Chronically on Eliquis. Code Status: DNR Family Communication: No family at bedside.  Disposition Plan: Discharge home with home oxygen - -Patient lives at home at home when she is legally blind  Consultants:   Cardiology service   Discharge Condition: Stable  Follow UP  Follow-up Information    Ellsworth Lennox, PA-C Follow up on 06/30/2019.   Specialties: Physician Assistant, Cardiology Why: Cardiology Follow-up on 06/30/2019 at 3:30 PM. Will be at the [facility].  Contact information: [address] [place] [address] [phone]            Consults obtained - cardiology  Diet and Activity recommendation:  As advised  Discharge Instructions    Discharge Instructions    Call MD for:  difficulty breathing, headache or visual disturbances   Complete by: As directed    Call MD for:  persistant dizziness or light-headedness   Complete by: As directed    Call MD for:  persistant nausea and vomiting   Complete by: As directed    Call MD for:  temperature >100.4   Complete by: As directed    Diet - low sodium heart healthy   Complete by: As directed    Discharge instructions   Complete by: As directed    1) you are taking Eliquis/apixaban for blood thinner so Avoid ibuprofen/Advil/Aleve/Motrin/Goody Powders/Naproxen/BC  powders/Meloxicam/Diclofenac/Indomethacin and other Nonsteroidal anti-inflammatory medications as these will make you more likely to bleed and can cause stomach ulcers, can also cause Kidney problems.   2)Take amiodarone 200 mg tablet twice a day until 07/08/2019, and then on 07/09/2019 start 200 mg once daily  3) take metoprolol XL/Toprol-XL  50mg  twice daily  4) stop furosemide/Lasix and start Torsemide 40mg  daily  5) repeat CBC and BMP blood test within a week with primary care physician advised  6)Very low-salt diet advised 7)Weigh yourself daily, call if you gain more than 3 pounds in 1 day or more than 5 pounds in 1 week as your diuretic medications may need to  be adjusted 8)Limit your Fluid  intake to no more than 60 ounces (1.8 Liters) per day   Increase activity slowly   Complete by: As directed         Discharge Medications     Allergies as of 06/11/2019      Reactions   Statins Other (See Comments)   Caused muscle aches and joint pain.   Aleve [naproxen Sodium] Other (See Comments)   Makes me fell "drunk-like"   Bee Venom Swelling   Codeine Hives   Ibuprofen Other (See Comments)   "drunk-like" feeling   Morphine And Related Other (See Comments)   Patient states she "felt weird".   Norvasc [amlodipine Besylate] Swelling   Prednisone Swelling   Sulfa Antibiotics    whelps   Tylenol [acetaminophen] Other (See Comments)   "drunk-like feeling"   Valium [diazepam]    Zetia [ezetimibe]    Penicillins Hives, Rash   Has patient had a PCN reaction causing immediate rash, facial/tongue/throat swelling, SOB or lightheadedness with hypotension: yes Has patient had a PCN reaction causing severe rash involving mucus membranes or skin necrosis: Unknown Has patient had a PCN reaction that required hospitalization: No Has patient had a PCN reaction occurring within the last 10 years: No If all of the above answers are "NO", then may proceed with Cephalosporin use.      Medication  List    STOP taking these medications   famotidine 20 MG tablet Commonly known as: PEPCID   furosemide 40 MG tablet Commonly known as: LASIX     TAKE these medications   alendronate 70 MG tablet Commonly known as: FOSAMAX Take 70 mg by mouth once a week. Take with a full glass of water on an empty stomach. Take on Sunday's.   amiodarone 200 MG tablet Commonly known as: PACERONE Take 1 tablet (200 mg total) by mouth See admin instructions. Take 200 mg twice a day until 07/08/2019, and then on 07/09/2019 start 200 mg daily What changed:   when to take this  additional instructions   benzonatate 200 MG capsule Commonly known as: TESSALON Take 200 mg by mouth 3 (three) times daily as needed for cough.   budesonide 0.5 MG/2ML nebulizer solution Commonly known as: PULMICORT Take 2 mLs (0.5 mg total) by nebulization 2 (two) times daily.   Eliquis 5 MG Tabs tablet Generic drug: apixaban Take 5 mg by mouth 2 (two) times daily.   ICAPS AREDS FORMULA PO Take 1 capsule by mouth 2 (two) times daily.   Combivent Respimat 20-100 MCG/ACT Aers respimat Generic drug: Ipratropium-Albuterol Inhale 1 puff into the lungs every 4 (four) hours as needed for wheezing or shortness of breath.   ipratropium-albuterol 0.5-2.5 (3) MG/3ML Soln Commonly known as: DUONEB Take 3 mLs by nebulization every 6 (six) hours as needed (shortness of breath).   isosorbide mononitrate 60 MG 24 hr tablet Commonly known as: IMDUR Take 1 tablet (60 mg total) by mouth daily.   levothyroxine 75 MCG tablet Commonly known as: SYNTHROID Take 75 mcg by mouth See admin instructions. Take 75 mcg every Sun, Tues, Wed, Fri, and Sat.  Take 112.5 mcg every Mon and Thurs.   lisinopril 20 MG tablet Commonly known as: ZESTRIL Take 20 mg by mouth daily.   LORazepam 0.5 MG tablet Commonly known as: ATIVAN Take 0.5 mg by mouth 2 (two) times daily as needed for anxiety or sleep.   metoprolol succinate 50 MG 24 hr  tablet Commonly known as: TOPROL-XL Take  1 tablet (50 mg total) by mouth 2 (two) times daily. Take with or immediately following a meal. What changed:   medication strength  when to take this  additional instructions   nitroGLYCERIN 0.4 MG SL tablet Commonly known as: NITROSTAT PLACE 1 TABLET UNDER THE TONGUE AT ONSET OF CHEST PAIN EVERY 5 MINTUES UP TO 3 TIMES AS NEEDED What changed: See the new instructions.   nystatin-triamcinolone ointment Commonly known as: MYCOLOG Apply 1 application topically 2 (two) times daily as needed (rash).   ondansetron 4 MG disintegrating tablet Commonly known as: ZOFRAN-ODT Take 4 mg by mouth every 8 (eight) hours as needed for nausea or vomiting.   pantoprazole 40 MG tablet Commonly known as: PROTONIX Take 1 tablet (40 mg total) by mouth daily.   polyethylene glycol 17 g packet Commonly known as: MIRALAX / GLYCOLAX Take 17 g by mouth daily.   Stiolto Respimat 2.5-2.5 MCG/ACT Aers Generic drug: Tiotropium Bromide-Olodaterol Inhale 2 puffs into the lungs daily.   torsemide 20 MG tablet Commonly known as: DEMADEX Take 2 tablets (40 mg total) by mouth daily. Start taking on: June 12, 2019   traMADol 50 MG tablet Commonly known as: ULTRAM Take 50 mg by mouth every 6 (six) hours as needed for moderate pain.   Vitamin D3 125 MCG (5000 UT) Caps Take 1 capsule by mouth daily.            Durable Medical Equipment  (From admission, onward)         Start     Ordered   06/09/19 1330  For home use only DME oxygen  Once    Comments: Please provide POC  Question Answer Comment  Length of Need Lifetime   Mode or (Route) Nasal cannula   Liters per Minute 4   Frequency Continuous (stationary and portable oxygen unit needed)   Oxygen conserving device Yes   Oxygen delivery system Gas      06/09/19 1330   06/09/19 1157  For home use only DME oxygen  Once    Question Answer Comment  Length of Need Lifetime   Mode or (Route) Nasal  cannula   Liters per Minute 4   Frequency Continuous (stationary and portable oxygen unit needed)   Oxygen conserving device Yes   Oxygen delivery system Gas      06/09/19 1157   06/09/19 1146  For home use only DME oxygen  Once    Question Answer Comment  Length of Need Lifetime   Mode or (Route) Nasal cannula   Liters per Minute 4   Frequency Continuous (stationary and portable oxygen unit needed)   Oxygen conserving device Yes   Oxygen delivery system Gas      06/09/19 1145          Major procedures and Radiology Reports - PLEASE review detailed and final reports for all details, in brief -    DG Chest Port 1 View  Result Date: 06/10/2019 CLINICAL DATA:  Shortness of breath and difficulty swallowing. EXAM: PORTABLE CHEST 1 VIEW COMPARISON:  06/03/2019.  05/18/2017. FINDINGS: Previous median sternotomy and CABG. Chronic cardiomegaly. Chronic pleural scarring. Cannot rule out the presence of some pleural fluid. Venous hypertension but without frank edema. IMPRESSION: Previous CABG. Chronic lung disease. Chronic pleural blunting consistent with pleural scarring seen on multiple previous studies. Cannot rule out the presence of some pleural fluid. Pulmonary venous hypertension but without frank edema. Electronically Signed   By: Paulina Fusi M.D.   On:  06/10/2019 13:20   DG Chest Port 1 View  Result Date: 06/03/2019 CLINICAL DATA:  Shortness of breath EXAM: PORTABLE CHEST 1 VIEW COMPARISON:  06/01/2019 FINDINGS: Median sternotomy and aortic valve replacement. Stable cardiomegaly. Atherosclerotic calcification of the aortic knob. Pulmonary vascular congestion. Small right pleural effusion with persistent bibasilar opacities. No pneumothorax. Degenerative changes of the shoulders. IMPRESSION: 1. Stable cardiomegaly with pulmonary vascular congestion and small right pleural effusion. 2. Right greater than left bibasilar opacities may reflect a combination of atelectasis and/or pneumonia.  Electronically Signed   By: Duanne Guess D.O.   On: 06/03/2019 11:48    Micro Results   Recent Results (from the past 240 hour(s))  SARS CORONAVIRUS 2 (TAT 6-24 HRS) Nasopharyngeal Nasopharyngeal Swab     Status: None   Collection Time: 06/03/19 12:58 PM   Specimen: Nasopharyngeal Swab  Result Value Ref Range Status   SARS Coronavirus 2 NEGATIVE NEGATIVE Final    Comment: (NOTE) SARS-CoV-2 target nucleic acids are NOT DETECTED. The SARS-CoV-2 RNA is generally detectable in upper and lower respiratory specimens during the acute phase of infection. Negative results do not preclude SARS-CoV-2 infection, do not rule out co-infections with other pathogens, and should not be used as the sole basis for treatment or other patient management decisions. Negative results must be combined with clinical observations, patient history, and epidemiological information. The expected result is Negative. Fact Sheet for Patients: HairSlick.no Fact Sheet for Healthcare Providers: quierodirigir.com This test is not yet approved or cleared by the [place] FDA and  has been authorized for detection and/or diagnosis of SARS-CoV-2 by FDA under an Emergency Use Authorization (EUA). This EUA will remain  in effect (meaning this test can be used) for the duration of the COVID-19 declaration under Section 56 4(b)(1) of the Act, 21 U.S.C. section 360bbb-3(b)(1), unless the authorization is terminated or revoked sooner. Performed at [facility], [address], [place], [place] [address]   Respiratory Panel by RT PCR (Flu A&B, Covid) - Nasopharyngeal Swab     Status: None   Collection Time: 06/03/19  2:59 PM   Specimen: Nasopharyngeal Swab  Result Value Ref Range Status   SARS Coronavirus 2 by RT PCR NEGATIVE NEGATIVE Final    Comment: (NOTE) SARS-CoV-2 target nucleic acids are NOT DETECTED. The SARS-CoV-2 RNA is generally detectable in  upper respiratoy specimens during the acute phase of infection. The lowest concentration of SARS-CoV-2 viral copies this assay can detect is 131 copies/mL. A negative result does not preclude SARS-Cov-2 infection and should not be used as the sole basis for treatment or other patient management decisions. A negative result may occur with  improper specimen collection/handling, submission of specimen other than nasopharyngeal swab, presence of viral mutation(s) within the areas targeted by this assay, and inadequate number of viral copies (<131 copies/mL). A negative result must be combined with clinical observations, patient history, and epidemiological information. The expected result is Negative. Fact Sheet for Patients:  https://www.moore.com/ Fact Sheet for Healthcare Providers:  https://www.young.biz/ This test is not yet ap proved or cleared by the [place] FDA and  has been authorized for detection and/or diagnosis of SARS-CoV-2 by FDA under an Emergency Use Authorization (EUA). This EUA will remain  in effect (meaning this test can be used) for the duration of the COVID-19 declaration under Section 564(b)(1) of the Act, 21 U.S.C. section 360bbb-3(b)(1), unless the authorization is terminated or revoked sooner.    Influenza A by PCR NEGATIVE NEGATIVE Final  Influenza B by PCR NEGATIVE NEGATIVE Final    Comment: (NOTE) The Xpert Xpress SARS-CoV-2/FLU/RSV assay is intended as an aid in  the diagnosis of influenza from Nasopharyngeal swab specimens and  should not be used as a sole basis for treatment. Nasal washings and  aspirates are unacceptable for Xpert Xpress SARS-CoV-2/FLU/RSV  testing. Fact Sheet for Patients: https://www.moore.com/https://www.fda.gov/media/142436/download Fact Sheet for Healthcare Providers: https://www.young.biz/https://www.fda.gov/media/142435/download This test is not yet approved or cleared by the [place] FDA and  has been authorized for  detection and/or diagnosis of SARS-CoV-2 by  FDA under an Emergency Use Authorization (EUA). This EUA will remain  in effect (meaning this test can be used) for the duration of the  Covid-19 declaration under Section 564(b)(1) of the Act, 21  U.S.C. section 360bbb-3(b)(1), unless the authorization is  terminated or revoked. Performed at [facility], [address] [place], [place] [address]   MRSA PCR Screening     Status: None   Collection Time: 06/03/19  5:03 PM   Specimen: Nasal Mucosa; Nasopharyngeal  Result Value Ref Range Status   MRSA by PCR NEGATIVE NEGATIVE Final    Comment:        The GeneXpert MRSA Assay (FDA approved for NASAL specimens only), is one component of a comprehensive MRSA colonization surveillance program. It is not intended to diagnose MRSA infection nor to guide or monitor treatment for MRSA infections. Performed at [facility], [address] [place], [place] [address]        Today   Subjective    Julie NickMary Burns today has no new complaints -Patient desaturated with ambulation requires O2 -No chest pains no palpitations no dizziness          Patient has been seen and examined prior to discharge   Objective   Blood pressure (!) 120/59, pulse (!) 55, temperature (!) 97.5 F (36.4 C), temperature source Oral, resp. rate 18, height 5\' 6"  (1.676 m), weight 90.8 kg, SpO2 100 %.   Intake/Output Summary (Last 24 hours) at 06/11/2019 1212 Last data filed at 06/11/2019 0900 Gross per 24 hour  Intake 240 ml  Output 900 ml  Net -660 ml    Exam General exam: Alert, awake, oriented x 3;   Eyes-patient is legally blind Nose- [place] 2L/min  Respiratory system:  Improved air movement, no wheezing s  Cardiovascular system: Rate controlled, sinus rhythm,  Gastrointestinal system: Abdomen is nondistended, soft and nontender. . Normal bowel sounds heard. Central nervous system: Alert and oriented. No focal neurological deficits.  Patient with generalized  weakness Extremities: No cyanosis or clubbing. Skin: No rashes, no petechiae. Psychiatry: Judgement and insight appear normal. Mood & affect appropriate.    Data Review   CBC w Diff:  Lab Results  Component Value Date   WBC 8.8 06/10/2019   HGB 12.0 06/10/2019   HCT 39.2 06/10/2019   PLT 178 06/10/2019   LYMPHOPCT 15 05/15/2017   MONOPCT 11 05/15/2017   EOSPCT 1 05/15/2017   BASOPCT 0 05/15/2017    CMP:  Lab Results  Component Value Date   NA 138 06/11/2019   K 3.9 06/11/2019   CL 95 (L) 06/11/2019   CO2 33 (H) 06/11/2019   BUN 28 (H) 06/11/2019   CREATININE 0.95 06/11/2019   PROT 6.4 (L) 05/17/2017   ALBUMIN 3.2 (L) 05/17/2017   BILITOT 1.1 05/17/2017   ALKPHOS 80 05/17/2017   AST 12 (L) 05/17/2017   ALT 9 (L) 05/17/2017  .   Total Discharge time is  about 33 minutes  Shon Hale M.D on 06/11/2019 at 12:12 PM  Go to [email or web address] -  for contact info  [facility] - Office  [phone]

## 2019-06-11 NOTE — Telephone Encounter (Signed)
I called Julie Burns but he was not available to speak to me. I left a voicemail to call us back. Will await return phone call

## 2019-06-12 ENCOUNTER — Telehealth: Payer: Self-pay | Admitting: Critical Care Medicine

## 2019-06-12 DIAGNOSIS — J449 Chronic obstructive pulmonary disease, unspecified: Secondary | ICD-10-CM | POA: Diagnosis not present

## 2019-06-12 DIAGNOSIS — J438 Other emphysema: Secondary | ICD-10-CM | POA: Diagnosis not present

## 2019-06-12 DIAGNOSIS — J9611 Chronic respiratory failure with hypoxia: Secondary | ICD-10-CM | POA: Diagnosis not present

## 2019-06-12 NOTE — Telephone Encounter (Signed)
Attempted to call pt's caretaker Alona Bene to see if O2 was delivered to pt's home yesterday 4/8 but unable to reach her. Left message for Alona Bene to return call.

## 2019-06-12 NOTE — Telephone Encounter (Signed)
Pt caregiver returning call b/c pt has O2 and nebulizer. Error was made on their end. Closing message.

## 2019-06-12 NOTE — Telephone Encounter (Signed)
Pt caretaker returning missed calls. States that pt was in the Hospital the pass few days with aphib which caused low O2. Caretaker states she's doing fine now. Care takers contact, Alona Bene: [phone] and can be reached before 4:30pm

## 2019-06-12 NOTE — Telephone Encounter (Signed)
Made an error. No longer needed. TM

## 2019-06-13 DIAGNOSIS — J9621 Acute and chronic respiratory failure with hypoxia: Secondary | ICD-10-CM | POA: Diagnosis not present

## 2019-06-13 DIAGNOSIS — I2510 Atherosclerotic heart disease of native coronary artery without angina pectoris: Secondary | ICD-10-CM | POA: Diagnosis not present

## 2019-06-13 DIAGNOSIS — I4891 Unspecified atrial fibrillation: Secondary | ICD-10-CM | POA: Diagnosis not present

## 2019-06-13 DIAGNOSIS — I130 Hypertensive heart and chronic kidney disease with heart failure and stage 1 through stage 4 chronic kidney disease, or unspecified chronic kidney disease: Secondary | ICD-10-CM | POA: Diagnosis not present

## 2019-06-13 DIAGNOSIS — I6521 Occlusion and stenosis of right carotid artery: Secondary | ICD-10-CM | POA: Diagnosis not present

## 2019-06-13 DIAGNOSIS — K589 Irritable bowel syndrome without diarrhea: Secondary | ICD-10-CM | POA: Diagnosis not present

## 2019-06-13 DIAGNOSIS — E1122 Type 2 diabetes mellitus with diabetic chronic kidney disease: Secondary | ICD-10-CM | POA: Diagnosis not present

## 2019-06-13 DIAGNOSIS — I5033 Acute on chronic diastolic (congestive) heart failure: Secondary | ICD-10-CM | POA: Diagnosis not present

## 2019-06-13 DIAGNOSIS — N1832 Chronic kidney disease, stage 3b: Secondary | ICD-10-CM | POA: Diagnosis not present

## 2019-06-13 DIAGNOSIS — R262 Difficulty in walking, not elsewhere classified: Secondary | ICD-10-CM | POA: Diagnosis not present

## 2019-06-13 DIAGNOSIS — H442B9 Degenerative myopia with macular hole, unspecified eye: Secondary | ICD-10-CM | POA: Diagnosis not present

## 2019-06-13 DIAGNOSIS — J449 Chronic obstructive pulmonary disease, unspecified: Secondary | ICD-10-CM | POA: Diagnosis not present

## 2019-06-13 DIAGNOSIS — I252 Old myocardial infarction: Secondary | ICD-10-CM | POA: Diagnosis not present

## 2019-06-13 NOTE — Addendum Note (Signed)
Addendum  created 06/13/19 0937 by Franco Nones, CRNA   Charge Capture section accepted

## 2019-06-15 NOTE — Telephone Encounter (Signed)
Called and talked to caregiver as patient is hard of hearing and couldn't hear me over the phone Patient had her Oxygen delivered  Patient does not need a nebulizer machine as she already has one.  Patient is seeing cardiology in 2 weeks for her afib  Will route to [facility] as FYI

## 2019-06-16 DIAGNOSIS — I5033 Acute on chronic diastolic (congestive) heart failure: Secondary | ICD-10-CM | POA: Diagnosis not present

## 2019-06-16 DIAGNOSIS — E1122 Type 2 diabetes mellitus with diabetic chronic kidney disease: Secondary | ICD-10-CM | POA: Diagnosis not present

## 2019-06-16 DIAGNOSIS — I4891 Unspecified atrial fibrillation: Secondary | ICD-10-CM | POA: Diagnosis not present

## 2019-06-16 DIAGNOSIS — I130 Hypertensive heart and chronic kidney disease with heart failure and stage 1 through stage 4 chronic kidney disease, or unspecified chronic kidney disease: Secondary | ICD-10-CM | POA: Diagnosis not present

## 2019-06-16 DIAGNOSIS — J449 Chronic obstructive pulmonary disease, unspecified: Secondary | ICD-10-CM | POA: Diagnosis not present

## 2019-06-16 DIAGNOSIS — I252 Old myocardial infarction: Secondary | ICD-10-CM | POA: Diagnosis not present

## 2019-06-16 DIAGNOSIS — I6521 Occlusion and stenosis of right carotid artery: Secondary | ICD-10-CM | POA: Diagnosis not present

## 2019-06-16 DIAGNOSIS — N1832 Chronic kidney disease, stage 3b: Secondary | ICD-10-CM | POA: Diagnosis not present

## 2019-06-16 DIAGNOSIS — K589 Irritable bowel syndrome without diarrhea: Secondary | ICD-10-CM | POA: Diagnosis not present

## 2019-06-16 DIAGNOSIS — H442B9 Degenerative myopia with macular hole, unspecified eye: Secondary | ICD-10-CM | POA: Diagnosis not present

## 2019-06-16 DIAGNOSIS — R262 Difficulty in walking, not elsewhere classified: Secondary | ICD-10-CM | POA: Diagnosis not present

## 2019-06-16 DIAGNOSIS — I2510 Atherosclerotic heart disease of native coronary artery without angina pectoris: Secondary | ICD-10-CM | POA: Diagnosis not present

## 2019-06-16 DIAGNOSIS — J9621 Acute and chronic respiratory failure with hypoxia: Secondary | ICD-10-CM | POA: Diagnosis not present

## 2019-06-16 NOTE — Telephone Encounter (Signed)
I'm sorry she was in the hospital. [facility] she is doing well now. Thanks for letting me know!  LPC

## 2019-06-17 ENCOUNTER — Telehealth: Payer: Self-pay | Admitting: Student

## 2019-06-17 NOTE — Telephone Encounter (Signed)
Left message to cal me back-cc

## 2019-06-17 NOTE — Telephone Encounter (Signed)
Pt weight yesterday 202 lbs, weight today 207 lbs home health aide Alona Bene.She states pt has swollen feet and legs.   Per B.Strader, PA-C, pt should take an extra 20 mg Torsemide tomorrow 4/15 and Friday 4/16. I spoke with pharmacist,Janet, at [facility], and they will deliver medication to patient's home tomorrow.

## 2019-06-17 NOTE — Telephone Encounter (Signed)
Pt's aid called stating that she noticed the pt has had increased swelling since she was d/c from the hospital, they changed her fluid medication and she's got a lot of swelling this morning  Please give pt a call @ [phone]

## 2019-06-18 DIAGNOSIS — I2510 Atherosclerotic heart disease of native coronary artery without angina pectoris: Secondary | ICD-10-CM | POA: Diagnosis not present

## 2019-06-18 DIAGNOSIS — I5033 Acute on chronic diastolic (congestive) heart failure: Secondary | ICD-10-CM | POA: Diagnosis not present

## 2019-06-18 DIAGNOSIS — R262 Difficulty in walking, not elsewhere classified: Secondary | ICD-10-CM | POA: Diagnosis not present

## 2019-06-18 DIAGNOSIS — I4891 Unspecified atrial fibrillation: Secondary | ICD-10-CM | POA: Diagnosis not present

## 2019-06-18 DIAGNOSIS — K589 Irritable bowel syndrome without diarrhea: Secondary | ICD-10-CM | POA: Diagnosis not present

## 2019-06-18 DIAGNOSIS — I252 Old myocardial infarction: Secondary | ICD-10-CM | POA: Diagnosis not present

## 2019-06-18 DIAGNOSIS — E1122 Type 2 diabetes mellitus with diabetic chronic kidney disease: Secondary | ICD-10-CM | POA: Diagnosis not present

## 2019-06-18 DIAGNOSIS — N1832 Chronic kidney disease, stage 3b: Secondary | ICD-10-CM | POA: Diagnosis not present

## 2019-06-18 DIAGNOSIS — J9621 Acute and chronic respiratory failure with hypoxia: Secondary | ICD-10-CM | POA: Diagnosis not present

## 2019-06-18 DIAGNOSIS — J449 Chronic obstructive pulmonary disease, unspecified: Secondary | ICD-10-CM | POA: Diagnosis not present

## 2019-06-18 DIAGNOSIS — I130 Hypertensive heart and chronic kidney disease with heart failure and stage 1 through stage 4 chronic kidney disease, or unspecified chronic kidney disease: Secondary | ICD-10-CM | POA: Diagnosis not present

## 2019-06-18 DIAGNOSIS — I6521 Occlusion and stenosis of right carotid artery: Secondary | ICD-10-CM | POA: Diagnosis not present

## 2019-06-18 DIAGNOSIS — H442B9 Degenerative myopia with macular hole, unspecified eye: Secondary | ICD-10-CM | POA: Diagnosis not present

## 2019-06-19 DIAGNOSIS — I5032 Chronic diastolic (congestive) heart failure: Secondary | ICD-10-CM | POA: Diagnosis not present

## 2019-06-19 DIAGNOSIS — I700 Atherosclerosis of aorta: Secondary | ICD-10-CM | POA: Diagnosis not present

## 2019-06-19 DIAGNOSIS — I10 Essential (primary) hypertension: Secondary | ICD-10-CM | POA: Diagnosis not present

## 2019-06-19 DIAGNOSIS — I480 Paroxysmal atrial fibrillation: Secondary | ICD-10-CM | POA: Diagnosis not present

## 2019-06-23 DIAGNOSIS — J9621 Acute and chronic respiratory failure with hypoxia: Secondary | ICD-10-CM | POA: Diagnosis not present

## 2019-06-23 DIAGNOSIS — I2510 Atherosclerotic heart disease of native coronary artery without angina pectoris: Secondary | ICD-10-CM | POA: Diagnosis not present

## 2019-06-23 DIAGNOSIS — N1832 Chronic kidney disease, stage 3b: Secondary | ICD-10-CM | POA: Diagnosis not present

## 2019-06-23 DIAGNOSIS — I5033 Acute on chronic diastolic (congestive) heart failure: Secondary | ICD-10-CM | POA: Diagnosis not present

## 2019-06-23 DIAGNOSIS — J449 Chronic obstructive pulmonary disease, unspecified: Secondary | ICD-10-CM | POA: Diagnosis not present

## 2019-06-23 DIAGNOSIS — E1122 Type 2 diabetes mellitus with diabetic chronic kidney disease: Secondary | ICD-10-CM | POA: Diagnosis not present

## 2019-06-23 DIAGNOSIS — R262 Difficulty in walking, not elsewhere classified: Secondary | ICD-10-CM | POA: Diagnosis not present

## 2019-06-23 DIAGNOSIS — I4891 Unspecified atrial fibrillation: Secondary | ICD-10-CM | POA: Diagnosis not present

## 2019-06-23 DIAGNOSIS — I6521 Occlusion and stenosis of right carotid artery: Secondary | ICD-10-CM | POA: Diagnosis not present

## 2019-06-23 DIAGNOSIS — I130 Hypertensive heart and chronic kidney disease with heart failure and stage 1 through stage 4 chronic kidney disease, or unspecified chronic kidney disease: Secondary | ICD-10-CM | POA: Diagnosis not present

## 2019-06-23 DIAGNOSIS — I252 Old myocardial infarction: Secondary | ICD-10-CM | POA: Diagnosis not present

## 2019-06-23 DIAGNOSIS — K589 Irritable bowel syndrome without diarrhea: Secondary | ICD-10-CM | POA: Diagnosis not present

## 2019-06-23 DIAGNOSIS — H442B9 Degenerative myopia with macular hole, unspecified eye: Secondary | ICD-10-CM | POA: Diagnosis not present

## 2019-06-24 ENCOUNTER — Telehealth: Payer: Self-pay | Admitting: Cardiology

## 2019-06-24 DIAGNOSIS — I5032 Chronic diastolic (congestive) heart failure: Secondary | ICD-10-CM | POA: Diagnosis not present

## 2019-06-24 NOTE — Telephone Encounter (Signed)
Patient was in the hospital for Afib and heart had to be shocked back into rhythm. Just wanted to let you know.

## 2019-06-26 DIAGNOSIS — I2510 Atherosclerotic heart disease of native coronary artery without angina pectoris: Secondary | ICD-10-CM | POA: Diagnosis not present

## 2019-06-26 DIAGNOSIS — H442B9 Degenerative myopia with macular hole, unspecified eye: Secondary | ICD-10-CM | POA: Diagnosis not present

## 2019-06-26 DIAGNOSIS — J449 Chronic obstructive pulmonary disease, unspecified: Secondary | ICD-10-CM | POA: Diagnosis not present

## 2019-06-26 DIAGNOSIS — N1832 Chronic kidney disease, stage 3b: Secondary | ICD-10-CM | POA: Diagnosis not present

## 2019-06-26 DIAGNOSIS — J9621 Acute and chronic respiratory failure with hypoxia: Secondary | ICD-10-CM | POA: Diagnosis not present

## 2019-06-26 DIAGNOSIS — R262 Difficulty in walking, not elsewhere classified: Secondary | ICD-10-CM | POA: Diagnosis not present

## 2019-06-26 DIAGNOSIS — I5033 Acute on chronic diastolic (congestive) heart failure: Secondary | ICD-10-CM | POA: Diagnosis not present

## 2019-06-26 DIAGNOSIS — K589 Irritable bowel syndrome without diarrhea: Secondary | ICD-10-CM | POA: Diagnosis not present

## 2019-06-26 DIAGNOSIS — E1122 Type 2 diabetes mellitus with diabetic chronic kidney disease: Secondary | ICD-10-CM | POA: Diagnosis not present

## 2019-06-26 DIAGNOSIS — I4891 Unspecified atrial fibrillation: Secondary | ICD-10-CM | POA: Diagnosis not present

## 2019-06-26 DIAGNOSIS — I6521 Occlusion and stenosis of right carotid artery: Secondary | ICD-10-CM | POA: Diagnosis not present

## 2019-06-26 DIAGNOSIS — I252 Old myocardial infarction: Secondary | ICD-10-CM | POA: Diagnosis not present

## 2019-06-26 DIAGNOSIS — I130 Hypertensive heart and chronic kidney disease with heart failure and stage 1 through stage 4 chronic kidney disease, or unspecified chronic kidney disease: Secondary | ICD-10-CM | POA: Diagnosis not present

## 2019-06-30 ENCOUNTER — Ambulatory Visit: Payer: Medicare Other | Admitting: Student

## 2019-06-30 DIAGNOSIS — I252 Old myocardial infarction: Secondary | ICD-10-CM | POA: Diagnosis not present

## 2019-06-30 DIAGNOSIS — R262 Difficulty in walking, not elsewhere classified: Secondary | ICD-10-CM | POA: Diagnosis not present

## 2019-06-30 DIAGNOSIS — I130 Hypertensive heart and chronic kidney disease with heart failure and stage 1 through stage 4 chronic kidney disease, or unspecified chronic kidney disease: Secondary | ICD-10-CM | POA: Diagnosis not present

## 2019-06-30 DIAGNOSIS — I5033 Acute on chronic diastolic (congestive) heart failure: Secondary | ICD-10-CM | POA: Diagnosis not present

## 2019-06-30 DIAGNOSIS — E1122 Type 2 diabetes mellitus with diabetic chronic kidney disease: Secondary | ICD-10-CM | POA: Diagnosis not present

## 2019-06-30 DIAGNOSIS — J449 Chronic obstructive pulmonary disease, unspecified: Secondary | ICD-10-CM | POA: Diagnosis not present

## 2019-06-30 DIAGNOSIS — H442B9 Degenerative myopia with macular hole, unspecified eye: Secondary | ICD-10-CM | POA: Diagnosis not present

## 2019-06-30 DIAGNOSIS — I6521 Occlusion and stenosis of right carotid artery: Secondary | ICD-10-CM | POA: Diagnosis not present

## 2019-06-30 DIAGNOSIS — I2510 Atherosclerotic heart disease of native coronary artery without angina pectoris: Secondary | ICD-10-CM | POA: Diagnosis not present

## 2019-06-30 DIAGNOSIS — I4891 Unspecified atrial fibrillation: Secondary | ICD-10-CM | POA: Diagnosis not present

## 2019-06-30 DIAGNOSIS — K589 Irritable bowel syndrome without diarrhea: Secondary | ICD-10-CM | POA: Diagnosis not present

## 2019-06-30 DIAGNOSIS — J9621 Acute and chronic respiratory failure with hypoxia: Secondary | ICD-10-CM | POA: Diagnosis not present

## 2019-06-30 DIAGNOSIS — N1832 Chronic kidney disease, stage 3b: Secondary | ICD-10-CM | POA: Diagnosis not present

## 2019-07-02 DIAGNOSIS — E1122 Type 2 diabetes mellitus with diabetic chronic kidney disease: Secondary | ICD-10-CM | POA: Diagnosis not present

## 2019-07-02 DIAGNOSIS — N1832 Chronic kidney disease, stage 3b: Secondary | ICD-10-CM | POA: Diagnosis not present

## 2019-07-02 DIAGNOSIS — J9621 Acute and chronic respiratory failure with hypoxia: Secondary | ICD-10-CM | POA: Diagnosis not present

## 2019-07-02 DIAGNOSIS — L84 Corns and callosities: Secondary | ICD-10-CM | POA: Diagnosis not present

## 2019-07-02 DIAGNOSIS — M79676 Pain in unspecified toe(s): Secondary | ICD-10-CM | POA: Diagnosis not present

## 2019-07-02 DIAGNOSIS — I252 Old myocardial infarction: Secondary | ICD-10-CM | POA: Diagnosis not present

## 2019-07-02 DIAGNOSIS — K589 Irritable bowel syndrome without diarrhea: Secondary | ICD-10-CM | POA: Diagnosis not present

## 2019-07-02 DIAGNOSIS — I2510 Atherosclerotic heart disease of native coronary artery without angina pectoris: Secondary | ICD-10-CM | POA: Diagnosis not present

## 2019-07-02 DIAGNOSIS — I130 Hypertensive heart and chronic kidney disease with heart failure and stage 1 through stage 4 chronic kidney disease, or unspecified chronic kidney disease: Secondary | ICD-10-CM | POA: Diagnosis not present

## 2019-07-02 DIAGNOSIS — J449 Chronic obstructive pulmonary disease, unspecified: Secondary | ICD-10-CM | POA: Diagnosis not present

## 2019-07-02 DIAGNOSIS — I4891 Unspecified atrial fibrillation: Secondary | ICD-10-CM | POA: Diagnosis not present

## 2019-07-02 DIAGNOSIS — I6521 Occlusion and stenosis of right carotid artery: Secondary | ICD-10-CM | POA: Diagnosis not present

## 2019-07-02 DIAGNOSIS — I5033 Acute on chronic diastolic (congestive) heart failure: Secondary | ICD-10-CM | POA: Diagnosis not present

## 2019-07-02 DIAGNOSIS — I70203 Unspecified atherosclerosis of native arteries of extremities, bilateral legs: Secondary | ICD-10-CM | POA: Diagnosis not present

## 2019-07-02 DIAGNOSIS — B351 Tinea unguium: Secondary | ICD-10-CM | POA: Diagnosis not present

## 2019-07-02 DIAGNOSIS — H442B9 Degenerative myopia with macular hole, unspecified eye: Secondary | ICD-10-CM | POA: Diagnosis not present

## 2019-07-02 DIAGNOSIS — R262 Difficulty in walking, not elsewhere classified: Secondary | ICD-10-CM | POA: Diagnosis not present

## 2019-07-08 ENCOUNTER — Ambulatory Visit (INDEPENDENT_AMBULATORY_CARE_PROVIDER_SITE_OTHER): Payer: Medicare Other | Admitting: Student

## 2019-07-08 ENCOUNTER — Other Ambulatory Visit: Payer: Self-pay

## 2019-07-08 ENCOUNTER — Encounter: Payer: Self-pay | Admitting: Student

## 2019-07-08 VITALS — BP 140/76 | HR 56 | Ht 66.5 in | Wt 193.0 lb

## 2019-07-08 DIAGNOSIS — Z952 Presence of prosthetic heart valve: Secondary | ICD-10-CM

## 2019-07-08 DIAGNOSIS — I6523 Occlusion and stenosis of bilateral carotid arteries: Secondary | ICD-10-CM

## 2019-07-08 DIAGNOSIS — I2510 Atherosclerotic heart disease of native coronary artery without angina pectoris: Secondary | ICD-10-CM | POA: Diagnosis not present

## 2019-07-08 DIAGNOSIS — I480 Paroxysmal atrial fibrillation: Secondary | ICD-10-CM

## 2019-07-08 DIAGNOSIS — I5032 Chronic diastolic (congestive) heart failure: Secondary | ICD-10-CM | POA: Diagnosis not present

## 2019-07-08 DIAGNOSIS — I10 Essential (primary) hypertension: Secondary | ICD-10-CM

## 2019-07-08 DIAGNOSIS — J9611 Chronic respiratory failure with hypoxia: Secondary | ICD-10-CM

## 2019-07-08 MED ORDER — AMIODARONE HCL 200 MG PO TABS: 200.0000 mg | ORAL_TABLET | Freq: Every day | ORAL | 3 refills | Status: DC

## 2019-07-08 NOTE — Patient Instructions (Addendum)
Medication Instructions:  Your physician recommends that you continue on your current medications as directed. Please refer to the Current Medication list given to you today.  Decrease Amiodarone to 200 mg Daily   *If you need a refill on your cardiac medications before your next appointment, please call your pharmacy*   Lab Work: NONE   If you have labs (blood work) drawn today and your tests are completely normal, you will receive your results only by: [name] MyChart Message (if you have MyChart) OR . A paper copy in the mail If you have any lab test that is abnormal or we need to change your treatment, we will call you to review the results.   Testing/Procedures: NONE   Follow-Up: At [facility], you and your health needs are our priority.  As part of our continuing mission to provide you with exceptional heart care, we have created designated Provider Care Teams.  These Care Teams include your primary Cardiologist (physician) and Advanced Practice Providers (APPs -  Physician Assistants and Nurse Practitioners) who all work together to provide you with the care you need, when you need it.  We recommend signing up for the patient portal called "MyChart".  Sign up information is provided on this After Visit Summary.  MyChart is used to connect with patients for Virtual Visits (Telemedicine).  Patients are able to view lab/test results, encounter notes, upcoming appointments, etc.  Non-urgent messages can be sent to your provider as well.   To learn more about what you can do with MyChart, go to [email or web address].    Your next appointment:   2-3 month(s)  The format for your next appointment:   In Person  Provider:   Rollene Rotunda, MD   Other Instructions Thank you for choosing [facility]!    Limit daily fluid intake to less than 2 Liters per day. Please limit salt intake.  Please weight yourself every morning. Take an extra Torsemide tablet if weight  increases by 3 pounds overnight or 5 pounds in a single week.

## 2019-07-08 NOTE — Progress Notes (Signed)
Cardiology Office Note    Date:  07/09/2019   ID:  Julie Burns, DOB March 11, 1930, MRN [id number]  PCP:  Kirstie PeriShah, Ashish, MD  Cardiologist: Rollene RotundaJames Hochrein, MD    Chief Complaint  Patient presents with  . Hospitalization Follow-up    History of Present Illness:    Julie Burns is a 86 y.o. female with past medical history of chronic diastolic CHF, s/p aortic valve replacement in 2010 and 2014, CAD (s/p DES to RCA and staged PCI of LAD and LCx in 2015), paroxysmal atrial fibrillation (s/p DCCV in 2017), carotid artery stenosis (s/p L CEA), HTN, HLD, COPD and severe pulmonary HTN who presents to the office today for hospital follow-up.   She was most recently admitted to [facility] last month for evaluation of dyspnea and nausea, found to have an acute CHF exacerbation (BNP 1086) and to be in atrial fibrillation with RVR. She required the use of IV Amiodarone due to hypotension and underwent DCCV on 06/08/2019 by Dr. Wyline MoodBranch with conversion back to NSR with a 200J shock. Was recommended to continue on Amiodarone 200mg  BID for 4 weeks then 200mg  daily with Toprol-XL being titrated to 50mg  BID. Diuretic therapy was also transitioned to Torsemide 40 mg daily with her last recorded weight at 200 lbs and creatinine at 0.95.  In talking with the patient and her caregiver today, she reports overall doing well since hospital discharge. She uses supplemental oxygen at baseline but respiratory status has remained stable. No recent orthopnea or PND. She denies any chest pain or palpitations. She had been experiencing some edema so her PCP titrated her Torsemide to 60mg  daily for one week and she was instructed to resume 40mg  daily this week. Had repeat labs last week.   She reports good compliance with anticoagulation and denies any melena, hematochezia or hematuria.   Past Medical History:  Diagnosis Date  . Age-related osteoporosis without current pathological fracture   . Anxiety disorder   . Atrial  fibrillation (HCC)   . CAD (coronary artery disease)    12/2013-DES to RCA due to NSTEMI- staged PCI to LAD and CX (not ideal for CABG; Echocardiogram 01/16/10 EF 55%, severe LVH; Moderate AS; mild ot moderate TR  . Chronic kidney disease   . Chronic respiratory failure with hypoxia (HCC)   . COPD (chronic obstructive pulmonary disease) (HCC)   . Degenerative myopia with macular hole   . Diabetes (HCC)    TYPE 2  . Hyperlipidemia   . Hypothyroidism   . Irritable bowel syndrome without diarrhea   . NSTEMI (non-ST elevated myocardial infarction) (HCC)    NSTEMI 3/05; s/p PTCA with stenting of left circumflex coronary artery 3/05; totally occluded small vessel OM  . Occlusion and stenosis of right carotid artery    Carotid Duplex 7/13 moderate disease (<70%) of the right internal carotid arteries, this is a high bifurcation, s/p left CEA without significant stenosis.  . Polyp of colon   . Presence of prosthetic heart valve    S/P aortic valve replacement 04/29/2008   . PVD (peripheral vascular disease) (HCC)   . Sicca syndrome (HCC)   . Stroke (cerebrum) (HCC)   . Unspecified abdominal hernia without obstruction or gangrene   . Vitamin D deficiency     Past Surgical History:  Procedure Laterality Date  . ABDOMINAL HERNIA REPAIR     per patient x 3  . AORTIC VALVE REPLACEMENT     S/P aortic valve replacement 04/29/2008   .  CARDIAC CATHETERIZATION     by Flonnie Hailstone at [facility]; mLAD 30%, patent LCx stent, mRCA 50%, LVEDP 10, severe AS with AV mean gradient 46 mmHg, Right heart catheterization   . CARDIOVERSION N/A 06/08/2019   Procedure: CARDIOVERSION;  Surgeon: Antoine Poche, MD;  Location: [facility];  Service: Endoscopy;  Laterality: N/A;  . CAROTID ENDARTERECTOMY     Carotid Duplex 7/13 moderate disease (<70%) of the right internal carotid arteries, this is a high bifurcation, s/p left CEA without significant stenosis.  . COLONOSCOPY     per patient- several in the past in [facility], couple of polyps, last TCS between 5-10 years ago.   [name] MASTECTOMY Left 2013   breast cancer    Current Medications: Outpatient Medications Prior to Visit  Medication Sig Dispense Refill  . alendronate (FOSAMAX) 70 MG tablet Take 70 mg by mouth once a week. Take with a full glass of water on an empty stomach. Take on Sunday's.    [name] apixaban (ELIQUIS) 5 MG TABS tablet Take 5 mg by mouth 2 (two) times daily.    . benzonatate (TESSALON) 200 MG capsule Take 200 mg by mouth 3 (three) times daily as needed for cough.    . budesonide (PULMICORT) 0.5 MG/2ML nebulizer solution Take 2 mLs (0.5 mg total) by nebulization 2 (two) times daily. 120 mL 5  . Cholecalciferol (VITAMIN D3) 5000 units CAPS Take 1 capsule by mouth daily.    . Ipratropium-Albuterol (COMBIVENT RESPIMAT) 20-100 MCG/ACT AERS respimat Inhale 1 puff into the lungs every 4 (four) hours as needed for wheezing or shortness of breath.     [name] ipratropium-albuterol (DUONEB) 0.5-2.5 (3) MG/3ML SOLN Take 3 mLs by nebulization every 6 (six) hours as needed (shortness of breath).     . isosorbide mononitrate (IMDUR) 60 MG 24 hr tablet Take 1 tablet (60 mg total) by mouth daily. 90 tablet 3  . levothyroxine (SYNTHROID, LEVOTHROID) 75 MCG tablet Take 75 mcg by mouth See admin instructions. Take 75 mcg every Sun, Tues, Wed, Fri, and Sat.  Take 112.5 mcg every Mon and Thurs.    [name] lisinopril (PRINIVIL,ZESTRIL) 20 MG tablet Take 20 mg by mouth daily.    [name] LORazepam (ATIVAN) 0.5 MG tablet Take 0.5 mg by mouth 2 (two) times daily as needed for anxiety or sleep.     . metoprolol succinate (TOPROL-XL) 50 MG 24 hr tablet Take 1 tablet (50 mg total) by mouth 2 (two) times daily. Take with or immediately following a meal. 60 tablet 5  . Multiple Vitamins-Minerals (ICAPS AREDS FORMULA PO) Take 1 capsule by mouth 2 (two) times daily.     . nitroGLYCERIN (NITROSTAT) 0.4 MG SL tablet PLACE 1 TABLET UNDER THE TONGUE AT ONSET OF CHEST PAIN EVERY 5 MINTUES UP TO 3  TIMES AS NEEDED (Patient taking differently: Place 0.4 mg under the tongue every 5 (five) minutes as needed. ) 25 tablet 3  . nystatin-triamcinolone ointment (MYCOLOG) Apply 1 application topically 2 (two) times daily as needed (rash).     . ondansetron (ZOFRAN-ODT) 4 MG disintegrating tablet Take 4 mg by mouth every 8 (eight) hours as needed for nausea or vomiting.     . pantoprazole (PROTONIX) 40 MG tablet Take 1 tablet (40 mg total) by mouth daily. 30 tablet 2  . polyethylene glycol (MIRALAX / GLYCOLAX) packet Take 17 g by mouth daily.    . Tiotropium Bromide-Olodaterol (STIOLTO RESPIMAT) 2.5-2.5 MCG/ACT AERS Inhale 2 puffs into the lungs daily. 4  g 11  . torsemide (DEMADEX) 20 MG tablet Take 2 tablets (40 mg total) by mouth daily. 30 tablet 3  . traMADol (ULTRAM) 50 MG tablet Take 50 mg by mouth every 6 (six) hours as needed for moderate pain.     [name] amiodarone (PACERONE) 200 MG tablet Take 1 tablet (200 mg total) by mouth See admin instructions. Take 200 mg twice a day until 07/08/2019, and then on 07/09/2019 start 200 mg daily 90 tablet 3   No facility-administered medications prior to visit.     Allergies:   Statins, Aleve [naproxen sodium], Bee venom, Codeine, Ibuprofen, Morphine and related, Norvasc [amlodipine besylate], Prednisone, Sulfa antibiotics, Tylenol [acetaminophen], Valium [diazepam], Zetia [ezetimibe], and Penicillins   Social History   Socioeconomic History  . Marital status: Divorced    Spouse name: Not on file  . Number of children: 6  . Years of education: Not on file  . Highest education level: GED or equivalent  Occupational History  . Occupation: retired  Tobacco Use  . Smoking status: Former Smoker    Packs/day: 3.00    Types: Cigarettes    Quit date: 10/23/1976    Years since quitting: 42.7  . Smokeless tobacco: Never Used  Substance and Sexual Activity  . Alcohol use: No  . Drug use: No  . Sexual activity: Not Currently  Other Topics Concern  . Not on  file  Social History Narrative   Pt lives at home alone but has a caregiver come in 7 days a week and talks with her daughter daily.   Social Determinants of Health   Financial Resource Strain: Low Risk   . Difficulty of Paying Living Expenses: Not hard at all  Food Insecurity: No Food Insecurity  . Worried About Programme researcher, broadcasting/film/video in the Last Year: Never true  . Ran Out of Food in the Last Year: Never true  Transportation Needs: No Transportation Needs  . Lack of Transportation (Medical): No  . Lack of Transportation (Non-Medical): No  Physical Activity: Inactive  . Days of Exercise per Week: 0 days  . Minutes of Exercise per Session: 0 min  Stress: Stress Concern Present  . Feeling of Stress : To some extent  Social Connections: Moderately Isolated  . Frequency of Communication with Friends and Family: More than three times a week  . Frequency of Social Gatherings with Friends and Family: Three times a week  . Attends Religious Services: Never  . Active Member of Clubs or Organizations: No  . Attends Banker Meetings: Never  . Marital Status: Divorced     Family History:  The patient's family history includes CAD in her brother, father, mother, and sister; COPD in her son; Diabetes in her brother and brother; Hypertension in her brother, brother, father, mother, and sister.   Review of Systems:   Please see the history of present illness.     General:  No chills, fever, night sweats or weight changes.  Cardiovascular:  No chest pain, edema, orthopnea, palpitations, paroxysmal nocturnal dyspnea. Positive for dyspnea on exertion.  Dermatological: No rash, lesions/masses Respiratory: No cough, dyspnea Urologic: No hematuria, dysuria Abdominal:   No nausea, vomiting, diarrhea, bright red blood per rectum, melena, or hematemesis Neurologic:  No visual changes, wkns, changes in mental status. All other systems reviewed and are otherwise negative except as noted  above.   Physical Exam:    VS:  BP 140/76   Pulse (!) 56   Ht 5' 6.5" (1.689  m)   Wt 193 lb (87.5 kg)   SpO2 98%   BMI 30.68 kg/m    General: Well developed, elderly female appearing in no acute distress. Head: Normocephalic, atraumatic, sclera non-icteric.  Neck: No carotid bruits. JVD not elevated.  Lungs: Respirations regular and unlabored, without wheezes or rales. On supplemental oxygen.  Heart: Regular rate and rhythm. No S3 or S4.  No rubs or gallops appreciated. 2/6 SEM along RUSB.  Abdomen: Soft, non-tender, non-distended. No obvious abdominal masses. Msk:  Strength and tone appear normal for age. No obvious joint deformities or effusions. Extremities: No clubbing or cyanosis. Trace ankle edema bilaterally.  Distal pedal pulses are 2+ bilaterally. Neuro: Alert and oriented X 3. Moves all extremities spontaneously. No focal deficits noted. Psych:  Responds to questions appropriately with a normal affect. Skin: No rashes or lesions noted  Wt Readings from Last 3 Encounters:  07/08/19 193 lb (87.5 kg)  06/10/19 200 lb 2.8 oz (90.8 kg)  05/20/19 196 lb 12.8 oz (89.3 kg)     Studies/Labs Reviewed:   EKG:  EKG is not ordered today.   Recent Labs: 06/03/2019: TSH 1.948 06/09/2019: Magnesium 2.0 06/10/2019: B Natriuretic Peptide 643.0; Hemoglobin 12.0; Platelets 178 06/11/2019: BUN 28; Creatinine, Ser 0.95; Potassium 3.9; Sodium 138   Lipid Panel No results found for: CHOL, TRIG, HDL, CHOLHDL, VLDL, LDLCALC, LDLDIRECT  Additional studies/ records that were reviewed today include:   Echocardiogram: 04/2019   1. Left ventricular ejection fraction, by visual estimation, is 60 to  65%. The left ventricle has normal function. There is moderately increased  left ventricular hypertrophy.  2. The left ventricle has no regional wall motion abnormalities.  3. Moderate mitral annular calcification. Probably mild mitral stenosis  (mean gradient 4 mmHg).  4. The tricuspid valve  is grossly normal.  5. The tricuspid valve is grossly normal. Tricuspid valve regurgitation  is moderate.  6. The aortic valve (porcine bioprosthesis) was not well visualized.  Aortic valve regurgitation is not visualized. No evidence of aortic valve  sclerosis or stenosis.  7. Aortic dilatation noted.  8. There is mild dilatation of the aortic root.  9. Severely elevated pulmonary artery systolic pressure.  10. The pulmonic valve was grossly normal. Pulmonic valve regurgitation is  mild.  11. Pulmonic regurgitation is mild.  12. Global right ventricle has normal systolic function.The right  ventricular size is normal. Mildly increased right ventricular wall  thickness.  13. Right atrial size was moderately dilated.  14. The mitral valve is degenerative. Mild mitral valve regurgitation.  15. Left atrial size was severely dilated.  16. Left ventricular diastolic parameters are consistent with Grade II  diastolic dysfunction (pseudonormalization).  17. Elevated left ventricular end-diastolic pressure.  18. Pulmonary hypertension is severe.  19. The inferior vena cava is dilated in size with >50% respiratory  variability, suggesting right atrial pressure of 8 mmHg.   Assessment:    1. Chronic diastolic (congestive) heart failure (HCC)   2. Paroxysmal atrial fibrillation (HCC)   3. S/P AVR (aortic valve replacement)   4. Coronary artery disease involving native coronary artery of native heart without angina pectoris   5. Bilateral carotid artery stenosis   6. Essential hypertension   7. Chronic respiratory failure with hypoxia (HCC)      Plan:   In order of problems listed above:  1. Chronic Diastolic CHF - She was recently admitted for an acute CHF exacerbation in the setting of atrial fibrillation with RVR.  Weight was 200  lbs upon the time of discharge and has declined to 193 lbs. Her PCP had temporarily titrated her Torsemide to 60mg  daily with instructions to resume  40mg  daily starting today. Will request labs from PCP as she states these were obtained last week. We reviewed she could take an extra Torsemide tablet as needed for weight gain or edema.   2. Paroxymal Atrial Fibrillation - She underwent prior DCCV in 2017 and was recently admitted for recurrent atrial fibrillation with RVR with repeat DCCV being performed on 06/08/2019. Maintaining NSR by examination and HR has been well-controlled when checked at home. Will reduce Amiodarone to 200mg  daily as outlined during her recent admission. Continue Toprol-XL 50mg  BID for rate-control and Eliquis 5mg  BID for anticoagulation (only indication for reduced dosing at this time is age).   3. Aortic Valve Replacement - s/p AVR in 2010 and 2014. Recent echocardiogram in 04/2019 showed no significant stenosis or regurgitation.   4. CAD - s/p DES to RCA and staged PCI of LAD and LCx in 2015. No recent chest pain and breathing has been at baseline. Continue Imdur and BB therapy. Not on ASA due to the need for anticoagulation and intolerant to multiple statins in the past.   5. Carotid Artery Stenosis - s/p L CEA and recent carotid dopplers in 02/2019 showed 40-59% RICA and 1-39% LICA stenosis. Anticipate repeat dopplers in 1 year. Intolerant to statins and she is no longer on ASA given the need for anticoagulation.   6. HTN - BP is slightly elevated at 40/76 during today's visit but has been well-controlled at home. Continue Imdur 60mg  daily, Lisinopril 20mg  daily and Toprol-XL 50mg  BID.   7. COPD/Pulmonary HTN - Pulmonary hypertension was severe by most recent echocardiogram as outlined above and thought to be secondary to her oxygen dependent COPD. Breathing has been at baseline since hospital discharge.    Medication Adjustments/Labs and Tests Ordered: Current medicines are reviewed at length with the patient today.  Concerns regarding medicines are outlined above.  Medication changes, Labs and Tests ordered  today are listed in the Patient Instructions below. Patient Instructions  Medication Instructions:  Your physician recommends that you continue on your current medications as directed. Please refer to the Current Medication list given to you today.  Decrease Amiodarone to 200 mg Daily   *If you need a refill on your cardiac medications before your next appointment, please call your pharmacy*   Lab Work: NONE   If you have labs (blood work) drawn today and your tests are completely normal, you will receive your results only by: 2011 MyChart Message (if you have MyChart) OR . A paper copy in the mail If you have any lab test that is abnormal or we need to change your treatment, we will call you to review the results.   Testing/Procedures: NONE   Follow-Up: At [facility], you and your health needs are our priority.  As part of our continuing mission to provide you with exceptional heart care, we have created designated Provider Care Teams.  These Care Teams include your primary Cardiologist (physician) and Advanced Practice Providers (APPs -  Physician Assistants and Nurse Practitioners) who all work together to provide you with the care you need, when you need it.  We recommend signing up for the patient portal called "MyChart".  Sign up information is provided on this After Visit Summary.  MyChart is used to connect with patients for Virtual Visits (Telemedicine).  Patients are able to view lab/test  results, encounter notes, upcoming appointments, etc.  Non-urgent messages can be sent to your provider as well.   To learn more about what you can do with MyChart, go to [email or web address].    Your next appointment:   2-3 month(s)  The format for your next appointment:   In Person  Provider:   Minus Breeding, MD   Other Instructions Thank you for choosing [facility]!    Limit daily fluid intake to less than 2 Liters per day. Please limit salt intake.  Please  weight yourself every morning. Take an extra Torsemide tablet if weight increases by 3 pounds overnight or 5 pounds in a single week.     Signed, Erma Heritage, PA-C  07/09/2019 7:37 AM    [facility] [address] [place], [place] [address] Phone: [phone] Fax: [phone]

## 2019-07-09 ENCOUNTER — Encounter: Payer: Self-pay | Admitting: Student

## 2019-07-12 DIAGNOSIS — J449 Chronic obstructive pulmonary disease, unspecified: Secondary | ICD-10-CM | POA: Diagnosis not present

## 2019-07-12 DIAGNOSIS — J438 Other emphysema: Secondary | ICD-10-CM | POA: Diagnosis not present

## 2019-07-12 DIAGNOSIS — J9611 Chronic respiratory failure with hypoxia: Secondary | ICD-10-CM | POA: Diagnosis not present

## 2019-07-29 DIAGNOSIS — I480 Paroxysmal atrial fibrillation: Secondary | ICD-10-CM | POA: Diagnosis not present

## 2019-07-29 DIAGNOSIS — J449 Chronic obstructive pulmonary disease, unspecified: Secondary | ICD-10-CM | POA: Diagnosis not present

## 2019-07-29 DIAGNOSIS — Z299 Encounter for prophylactic measures, unspecified: Secondary | ICD-10-CM | POA: Diagnosis not present

## 2019-07-29 DIAGNOSIS — I5032 Chronic diastolic (congestive) heart failure: Secondary | ICD-10-CM | POA: Diagnosis not present

## 2019-07-29 DIAGNOSIS — I10 Essential (primary) hypertension: Secondary | ICD-10-CM | POA: Diagnosis not present

## 2019-07-29 DIAGNOSIS — I700 Atherosclerosis of aorta: Secondary | ICD-10-CM | POA: Diagnosis not present

## 2019-07-30 ENCOUNTER — Telehealth: Payer: Self-pay

## 2019-07-30 NOTE — Telephone Encounter (Signed)
    She does have a history of known paroxysmal atrial fibrillation and underwent cardioversion during admission in 06/2019. At that time, her resting heart rate while in atrial fibrillation was in the 100's to 110's consistently. It is possible that she might have had a brief episode yesterday but if heart rate is back in the 70's today, perhaps she went back into normal sinus rhythm. I would recommend they continue to monitor her heart rate and let us know if this remains continuously elevated as she would need to come in for an EKG. Continue current medication regimen for now with Amiodarone, Toprol-XL and Eliquis.  Signed, Ellsworth Lennox, PA-C 07/30/2019, 4:08 PM Pager: [phone]

## 2019-07-30 NOTE — Telephone Encounter (Signed)
Pt c/o resting HR above 100 that started on yesterday. Pt denies SOB and CP at this time. Current BP is 133/82 HR 79. Pt has history of Afib. Please advise.

## 2019-07-30 NOTE — Telephone Encounter (Signed)
Pt states her heart rate is up. Resting HR above 100.   Please call [phone]  Thanks renee

## 2019-07-31 ENCOUNTER — Telehealth: Payer: Self-pay

## 2019-07-31 ENCOUNTER — Ambulatory Visit (INDEPENDENT_AMBULATORY_CARE_PROVIDER_SITE_OTHER): Payer: Medicare Other | Admitting: *Deleted

## 2019-07-31 ENCOUNTER — Other Ambulatory Visit: Payer: Self-pay

## 2019-07-31 DIAGNOSIS — I4891 Unspecified atrial fibrillation: Secondary | ICD-10-CM | POA: Diagnosis not present

## 2019-07-31 MED ORDER — METOPROLOL SUCCINATE ER 50 MG PO TB24: 75.0000 mg | ORAL_TABLET | Freq: Every day | ORAL | 6 refills | Status: DC

## 2019-07-31 MED ORDER — METOPROLOL SUCCINATE ER 50 MG PO TB24: 75.0000 mg | ORAL_TABLET | Freq: Two times a day (BID) | ORAL | 6 refills | Status: DC

## 2019-07-31 NOTE — Progress Notes (Signed)
Pt in office to have EKG done d/t having increased HR at home. Pt denies chest pain and SOB at this time. She states that she just feels bad.

## 2019-07-31 NOTE — Patient Instructions (Signed)
Medication Instructions:  Stop Taking Amiodarone  Increase Toprol XL to 75 mg Two Times Daily   Labwork: NONE   Testing/Procedures: NONE   Follow-Up: Your physician recommends that you schedule a follow-up appointment in: 1 Week     Any Other Special Instructions Will Be Listed Below (If Applicable).     If you need a refill on your cardiac medications before your next appointment, please call your pharmacy.

## 2019-07-31 NOTE — Telephone Encounter (Signed)
Rec'd call from Pt's Aide. Pulse rate 122 to 127 at times.  Please call [phone]   Thanks renee

## 2019-07-31 NOTE — Progress Notes (Signed)
    I went in to see the patient at the time of her nurse visit as EKG showed atrial fibrillation with RVR. She denied any palpitations reported her breathing had overall been at baseline but she was feeling weak over the past few days.  I reviewed with [name] (DOD) and will plan to stop Amiodarone given she had been on this for several years prior to admission and failed reloading as well. Will pursue a rate-control strategy. Will titrate Toprol-XL from 50 mg twice daily to 75 mg twice daily. BP was at 144/78 during today's visit. Will arrange for follow-up next week and she will need a repeat EKG at that time. If rates remain elevated, would further titrate Toprol-XL or add Cardizem. Lisinopril can be reduced or discontinued if she develops soft BP.  Signed, Ellsworth Lennox, PA-C 07/31/2019, 5:15 PM Pager: [phone]

## 2019-07-31 NOTE — Telephone Encounter (Signed)
Spoke with pt who states that her heart rate has been elevated in the 120's today. Pt denies chest pain and SOB, but states that she does not feel well. Pt advised to come in office for EKG on today.

## 2019-08-03 DIAGNOSIS — J449 Chronic obstructive pulmonary disease, unspecified: Secondary | ICD-10-CM | POA: Diagnosis not present

## 2019-08-03 DIAGNOSIS — I10 Essential (primary) hypertension: Secondary | ICD-10-CM | POA: Diagnosis not present

## 2019-08-04 NOTE — Progress Notes (Signed)
Cardiology Office Note  Date: 08/05/2019   ID: Julie, Burns 08-28-1930, MRN [id number]  PCP:  Julie Peri, MD  Cardiologist:  Julie Rotunda, MD Electrophysiologist:  Julie Burns   Chief Complaint: Follow-up chronic diastolic heart failure  History of Present Illness: Julie Burns is a 86 y.o. female with a history of chronic diastolic heart failure, aortic valve replacement 2010 and 2014, CAD (status post DES to RCA and staged PCI of LAD and LCx in 2015), paroxysmall atrial fibrillation (status post DCCV in 2017), carotid artery stenosis (status post left CEA), hypertension, hyperlipidemia, COPD and severe pulmonary hypertension.  Last saw Julie Burns, Julie Burns 07/08/2019 for follow-up chronic diastolic heart failure.  She had been recently admitted to [facility] the previous month for evaluation of dyspnea, nausea, and found to have acute CHF exacerbation with BNP of 1086 and atrial fibrillation RVR.  IV amiodarone was started due to hypotension.  Patient underwent a DCCV on 06/08/2019 by Dr. Wyline Burns with conversion back to normal sinus rhythm.  She continued amiodarone outpatient 200 mg twice daily x4 weeks then decreased to 200 mg daily with Toprol being titrated to 50 mg p.o. twice daily.  She was transitioned to torsemide 40 mg daily.  During visit with Ms. Julie Burns she was using supplemental oxygen at home with respiratory symptoms remaining stable.  She denied any orthopnea or PND, chest pain, palpitations.  She was experiencing some edema.  Her PCP titrated her torsemide to 60 mg daily for 1 week and instructed to resume 40 mg daily the following week.  She was compliant with her anticoagulation and reported no bleeding issues.  Received a phone call from patient on 07/31/2019 stating her heart rate had been elevated in 120s.  Patient came in for Burns EKG on 07/31/2019 which showed atrial fibrillation with RVR.  Results were reviewed with Dr. Purvis Burns and plan was to stop amiodarone.  Plans were  to pursue a rate control strategy.  Toprol was titrated up to 75 mg p.o. twice daily.  Plans for follow-up in 1 week and repeat EKG.  If rates remained elevated would titrate Toprol-XL or add Cardizem.  Lisinopril could be reduced or discontinued if she developed soft BP.  Today she presents for follow-up with EKG showing atrial fibrillation with rapid ventricular response rate of 114, left axis deviation, marked ST abnormality, possible inferior subendocardial injury.  She states she is having a little more shortness of breath.  She is on 2 L nasal continuous O2.  He states her ankles are still a little swollen.  Otherwise she denies any chest pain, pressure, tightness, neck, arm, back or jaw pain.  Denies any orthostatic symptoms.  Her blood pressure is 102 systolic today.  Denies any PND or orthopnea.  Past Medical History:  Diagnosis Date  . Age-related osteoporosis without current pathological fracture   . Anxiety disorder   . Atrial fibrillation (HCC)   . CAD (coronary artery disease)    12/2013-DES to RCA due to NSTEMI- staged PCI to LAD and CX (not ideal for CABG; Echocardiogram 01/16/10 EF 55%, severe LVH; Moderate AS; mild ot moderate TR  . Chronic kidney disease   . Chronic respiratory failure with hypoxia (HCC)   . COPD (chronic obstructive pulmonary disease) (HCC)   . Degenerative myopia with macular hole   . Diabetes (HCC)    TYPE 2  . Hyperlipidemia   . Hypothyroidism   . Irritable bowel syndrome without diarrhea   . NSTEMI (non-ST elevated  myocardial infarction) (HCC)    NSTEMI 3/05; s/p PTCA with stenting of left circumflex coronary artery 3/05; totally occluded small vessel OM  . Occlusion and stenosis of right carotid artery    Carotid Duplex 7/13 moderate disease (<70%) of the right internal carotid arteries, this is a high bifurcation, s/p left CEA without significant stenosis.  . Polyp of colon   . Presence of prosthetic heart valve    S/P aortic valve replacement  04/29/2008   . PVD (peripheral vascular disease) (HCC)   . Sicca syndrome (HCC)   . Stroke (cerebrum) (HCC)   . Unspecified abdominal hernia without obstruction or gangrene   . Vitamin D deficiency     Past Surgical History:  Procedure Laterality Date  . ABDOMINAL HERNIA REPAIR     per patient x 3  . AORTIC VALVE REPLACEMENT     S/P aortic valve replacement 04/29/2008   . CARDIAC CATHETERIZATION     by Flonnie Hailstone at [facility]; mLAD 30%, patent LCx stent, mRCA 50%, LVEDP 10, severe AS with AV mean gradient 46 mmHg, Right heart catheterization   . CARDIOVERSION N/A 06/08/2019   Procedure: CARDIOVERSION;  Surgeon: Antoine Poche, MD;  Location: [facility];  Service: Endoscopy;  Laterality: N/A;  . CAROTID ENDARTERECTOMY     Carotid Duplex 7/13 moderate disease (<70%) of the right internal carotid arteries, this is a high bifurcation, s/p left CEA without significant stenosis.  . COLONOSCOPY     per patient- several in the past in [facility], couple of polyps, last TCS between 5-10 years ago.   Julie Burns MASTECTOMY Left 2013   breast cancer    Current Outpatient Medications  Medication Sig Dispense Refill  . alendronate (FOSAMAX) 70 MG tablet Take 70 mg by mouth once a week. Take with a full glass of water on Burns empty stomach. Take on Sunday's.    Julie Burns apixaban (ELIQUIS) 5 MG TABS tablet Take 5 mg by mouth 2 (two) times daily.    . benzonatate (TESSALON) 200 MG capsule Take 200 mg by mouth 3 (three) times daily as needed for cough.    . budesonide (PULMICORT) 0.5 MG/2ML nebulizer solution Take 2 mLs (0.5 mg total) by nebulization 2 (two) times daily. 120 mL 5  . Cholecalciferol (VITAMIN D3) 5000 units CAPS Take 1 capsule by mouth daily.    . Ipratropium-Albuterol (COMBIVENT RESPIMAT) 20-100 MCG/ACT AERS respimat Inhale 1 puff into the lungs every 4 (four) hours as needed for wheezing or shortness of breath.     Julie Burns ipratropium-albuterol (DUONEB) 0.5-2.5 (3) MG/3ML SOLN Take 3 mLs by nebulization every 6  (six) hours as needed (shortness of breath).     . isosorbide mononitrate (IMDUR) 60 MG 24 hr tablet Take 1 tablet (60 mg total) by mouth daily. 90 tablet 3  . levothyroxine (SYNTHROID, LEVOTHROID) 75 MCG tablet Take 75 mcg by mouth See admin instructions. Take 75 mcg every Sun, Tues, Wed, Fri, and Sat.  Take 112.5 mcg every Mon and Thurs.    Julie Burns lisinopril (PRINIVIL,ZESTRIL) 20 MG tablet Take 20 mg by mouth daily.    Julie Burns LORazepam (ATIVAN) 0.5 MG tablet Take 0.5 mg by mouth 2 (two) times daily as needed for anxiety or sleep.     . metoprolol succinate (TOPROL-XL) 50 MG 24 hr tablet Take 1.5 tablets (75 mg total) by mouth 2 (two) times daily. Take with or immediately following a meal. 90 tablet 6  . Multiple Vitamins-Minerals (ICAPS AREDS FORMULA PO) Take 1 capsule by  mouth 2 (two) times daily.     . nitroGLYCERIN (NITROSTAT) 0.4 MG SL tablet PLACE 1 TABLET UNDER THE TONGUE AT ONSET OF CHEST PAIN EVERY 5 MINTUES UP TO 3 TIMES AS NEEDED (Patient taking differently: Place 0.4 mg under the tongue every 5 (five) minutes as needed. ) 25 tablet 3  . nystatin-triamcinolone ointment (MYCOLOG) Apply 1 application topically 2 (two) times daily as needed (rash).     . ondansetron (ZOFRAN-ODT) 4 MG disintegrating tablet Take 4 mg by mouth every 8 (eight) hours as needed for nausea or vomiting.     . pantoprazole (PROTONIX) 40 MG tablet Take 1 tablet (40 mg total) by mouth daily. 30 tablet 2  . polyethylene glycol (MIRALAX / GLYCOLAX) packet Take 17 g by mouth daily.    . Tiotropium Bromide-Olodaterol (STIOLTO RESPIMAT) 2.5-2.5 MCG/ACT AERS Inhale 2 puffs into the lungs daily. 4 g 11  . torsemide (DEMADEX) 20 MG tablet Take 2 tablets (40 mg total) by mouth daily. 30 tablet 3  . traMADol (ULTRAM) 50 MG tablet Take 50 mg by mouth every 6 (six) hours as needed for moderate pain.     Julie Burns diltiazem (CARDIZEM CD) 120 MG 24 hr capsule Take 1 capsule (120 mg total) by mouth daily. 90 capsule 3   No current  facility-administered medications for this visit.   Allergies:  Statins, Aleve [naproxen sodium], Bee venom, Codeine, Ibuprofen, Morphine and related, Norvasc [amlodipine besylate], Prednisone, Sulfa antibiotics, Tylenol [acetaminophen], Valium [diazepam], Zetia [ezetimibe], and Penicillins   Social History: The patient  reports that she quit smoking about 42 years ago. Her smoking use included cigarettes. She smoked 3.00 packs per day. She has never used smokeless tobacco. She reports that she does not drink alcohol or use drugs.   Family History: The patient's family history includes CAD in her brother, father, mother, and sister; COPD in her son; Diabetes in her brother and brother; Hypertension in her brother, brother, father, mother, and sister.   ROS:  Please see the history of present illness. Otherwise, complete review of systems is positive for Julie Burns.  All other systems are reviewed and negative.   Physical Exam: VS:  BP 102/66   Pulse (!) 114   Ht 5' 5.6" (1.666 m)   Wt 200 lb (90.7 kg)   SpO2 96%   BMI 32.68 kg/m , BMI Body mass index is 32.68 kg/m.  Wt Readings from Last 3 Encounters:  08/05/19 200 lb (90.7 kg)  07/08/19 193 lb (87.5 kg)  06/10/19 200 lb 2.8 oz (90.8 kg)    General: Patient appears comfortable at rest. Neck: Supple, no elevated JVP or carotid bruits, no thyromegaly. Lungs: Clear to auscultation, nonlabored breathing at rest. Cardiac: Irregularly irregular tachycardic rate and rhythm, no S3 or significant systolic murmur, no pericardial rub. Extremities: Mild bilateral non-pitting edema, distal pulses 2+. Skin: Warm and dry. Musculoskeletal: No kyphosis. Neuropsychiatric: Alert and oriented x3, affect grossly appropriate.  ECG:  Burns ECG dated 08/05/2019 was personally reviewed today and demonstrated:  Atrial fibrillation with rapid ventricular response, left axis deviation, rate of 114.  Marked ST abnormality, possible inferior subendocardial injury  Recent  Labwork: 06/03/2019: TSH 1.948 06/09/2019: Magnesium 2.0 06/10/2019: B Natriuretic Peptide 643.0; Hemoglobin 12.0; Platelets 178 06/11/2019: BUN 28; Creatinine, Ser 0.95; Potassium 3.9; Sodium 138  No results found for: CHOL, TRIG, HDL, CHOLHDL, VLDL, LDLCALC, LDLDIRECT  Other Studies Reviewed Today:  Echocardiogram: 04/2019 1. Left ventricular ejection fraction, by visual estimation, is 60 to  65%. The  left ventricle has normal function. There is moderately increased  left ventricular hypertrophy.  2. The left ventricle has no regional wall motion abnormalities.  3. Moderate mitral annular calcification. Probably mild mitral stenosis  (mean gradient 4 mmHg).  4. The tricuspid valve is grossly normal.  5. The tricuspid valve is grossly normal. Tricuspid valve regurgitation  is moderate.  6. The aortic valve (porcine bioprosthesis) was not well visualized.  Aortic valve regurgitation is not visualized. No evidence of aortic valve  sclerosis or stenosis.  7. Aortic dilatation noted.  8. There is mild dilatation of the aortic root.  9. Severely elevated pulmonary artery systolic pressure.  10. The pulmonic valve was grossly normal. Pulmonic valve regurgitation is  mild.  11. Pulmonic regurgitation is mild.  12. Global right ventricle has normal systolic function.The right  ventricular size is normal. Mildly increased right ventricular wall  thickness.  13. Right atrial size was moderately dilated.  14. The mitral valve is degenerative. Mild mitral valve regurgitation.  15. Left atrial size was severely dilated.  16. Left ventricular diastolic parameters are consistent with Grade II  diastolic dysfunction (pseudonormalization).  17. Elevated left ventricular end-diastolic pressure.  18. Pulmonary hypertension is severe.  19. The inferior vena cava is dilated in size with >50% respiratory  variability, suggesting right atrial pressure of 8 mmHg.   Assessment and Plan:  1.  Chronic diastolic (congestive) heart failure (HCC)   2. Paroxysmal atrial fibrillation (HCC)   3. S/P AVR (aortic valve replacement)   4. CAD in native artery   5. Bilateral carotid artery stenosis   6. Essential hypertension   7. Severe pulmonary arterial systolic hypertension (HCC)    1. Chronic diastolic (congestive) heart failure (HCC) Patient weight is 200 pounds today.  Daughter who is with her states weight was 195 at home.  2. Paroxysmal atrial fibrillation (HCC) EKG today shows atrial fibrillation with RVR rate of 114.  Start diltiazem CD 120 mg daily.  Continue Toprol 75 mg p.o. twice daily.  Come back in 1 week for nursing visit for EKG.  Continue Eliquis 5 mg p.o. twice daily.  3. S/P AVR (aortic valve replacement) Denies any issue with aortic valve.(porcine bioprosthesis) No dyspnea, syncope, or angina.  4. CAD in native artery Denies any anginal symptoms.  Continue nitroglycerin sublingual as needed, Imdur 60 mg daily.  Toprol XL 75 mg p.o. twice daily   6. Essential hypertension Blood pressure 102/68 today, DC lisinopril 20 mg due to soft blood pressures  7. Severe pulmonary arterial systolic hypertension (HCC) Severe on last echo 04/07/2019.   Medication Adjustments/Labs and Tests Ordered: Current medicines are reviewed at length with the patient today.  Concerns regarding medicines are outlined above.   Disposition: Follow-up with Dr. Antoine Poche or APP 1 month  Signed, Rennis Harding, NP 08/05/2019 10:51 AM    [facility] at [facility] [address] [place], [place], [place] [address] Phone: [phone]; Fax: [phone]

## 2019-08-05 ENCOUNTER — Ambulatory Visit (INDEPENDENT_AMBULATORY_CARE_PROVIDER_SITE_OTHER): Payer: Medicare Other | Admitting: Family Medicine

## 2019-08-05 ENCOUNTER — Other Ambulatory Visit: Payer: Self-pay

## 2019-08-05 ENCOUNTER — Encounter: Payer: Self-pay | Admitting: Family Medicine

## 2019-08-05 VITALS — BP 102/66 | HR 114 | Ht 65.6 in | Wt 200.0 lb

## 2019-08-05 DIAGNOSIS — Z952 Presence of prosthetic heart valve: Secondary | ICD-10-CM | POA: Diagnosis not present

## 2019-08-05 DIAGNOSIS — I10 Essential (primary) hypertension: Secondary | ICD-10-CM

## 2019-08-05 DIAGNOSIS — I5032 Chronic diastolic (congestive) heart failure: Secondary | ICD-10-CM | POA: Diagnosis not present

## 2019-08-05 DIAGNOSIS — I6523 Occlusion and stenosis of bilateral carotid arteries: Secondary | ICD-10-CM | POA: Diagnosis not present

## 2019-08-05 DIAGNOSIS — I2510 Atherosclerotic heart disease of native coronary artery without angina pectoris: Secondary | ICD-10-CM

## 2019-08-05 DIAGNOSIS — I480 Paroxysmal atrial fibrillation: Secondary | ICD-10-CM | POA: Diagnosis not present

## 2019-08-05 DIAGNOSIS — I2721 Secondary pulmonary arterial hypertension: Secondary | ICD-10-CM

## 2019-08-05 MED ORDER — DILTIAZEM HCL ER COATED BEADS 120 MG PO CP24
120.0000 mg | ORAL_CAPSULE | Freq: Every day | ORAL | 3 refills | Status: DC
Start: 2019-08-05 — End: 2019-08-17

## 2019-08-05 NOTE — Patient Instructions (Signed)
Medication Instructions:  Your physician has recommended you make the following change in your medication:  Stop Taking Lisinopril  Start Diltiazem CD 120 mg Daily    *If you need a refill on your cardiac medications before your next appointment, please call your pharmacy*   Lab Work: NONE   If you have labs (blood work) drawn today and your tests are completely normal, you will receive your results only by: [name] MyChart Message (if you have MyChart) OR . A paper copy in the mail If you have any lab test that is abnormal or we need to change your treatment, we will call you to review the results.   Testing/Procedures: NONE   Follow-Up: At [facility], you and your health needs are our priority.  As part of our continuing mission to provide you with exceptional heart care, we have created designated Provider Care Teams.  These Care Teams include your primary Cardiologist (physician) and Advanced Practice Providers (APPs -  Physician Assistants and Nurse Practitioners) who all work together to provide you with the care you need, when you need it.  We recommend signing up for the patient portal called "MyChart".  Sign up information is provided on this After Visit Summary.  MyChart is used to connect with patients for Virtual Visits (Telemedicine).  Patients are able to view lab/test results, encounter notes, upcoming appointments, etc.  Non-urgent messages can be sent to your provider as well.   To learn more about what you can do with MyChart, go to [email or web address].    Your next appointment:   1 month(s)  The format for your next appointment:   In Person  Provider:   Rollene Rotunda, MD   Other Instructions Your physician recommends that you schedule a follow-up appointment in: 2 weeks for nursing visit for EKG.    Thank you for choosing [facility]!

## 2019-08-08 NOTE — Progress Notes (Signed)
Referring Provider: Kirstie Peri, MD Primary Care Physician:  Kirstie Peri, MD Primary GI Physician: Dr. Jena Gauss  Chief Complaint  Patient presents with  . Nausea    2-3 times per day or can go 1 week without any  . Constipation    BM's daily    HPI:   Julie Burns is a 86 y.o. female with several chronic medical conditions including diastolic dysfunction with preserved ejection fraction, atrial fibrillation, CAD s/p DES in 2015, aortic valve replacement, PVD, pulmonary hypertension, COPD on 2.5-3 L supplemental O2, hypothyroidism, diabetes, CKD, HTN, HLD, stroke, anxiety, and sicca syndrome.  She presents today for follow-up of nausea and constipation.  Last seen in our office on 04/08/2019 at the time of initial consult for nausea.  She reported occasionally waking in the morning with nausea.  Also occasionally with nausea during the day.  Had been intermittent for the last 5 years without worsening.  Can have symptoms 3-4 times a week for nothing for 1 month.  Using Zofran as needed.  History of reflux but this was well controlled on Pepcid.  Admitted to eating within 3 hours of going to bed, occasionally eating during the middle of the night, also fried foods at least 2-3 times a week.  Also noted loving spicy foods and drinking tomato juice and coffee.  Constipation was well controlled with 2 stool softeners and MiraLAX daily.  She is advised to continue her current medications for constipation, continue Zofran as needed, stop Pepcid and start Protonix 40 mg daily to see if this would help with nausea.  Also counseled on GERD diet/lifestyle.  Telephone call 05/06/2019 with patient reporting having more trouble with GERD/burning sensation since stopping Pepcid and starting Protonix 40 mg daily.  Discussed trying a different PPI versus resuming Pepcid as she felt her GERD symptoms were well controlled on Pepcid.  Patient opted to resume Pepcid.  Also again reinforced the importance of a GERD  diet and lifestyle.  Today: Not sure if she is taking Pepcid or Protonix. Taking one of the medications once a day. No heartburn or esophageal burning. Intermittent nausea.  Can be a couple times a day or no symptoms for 1 or more weeks.  Sometimes in the middle of the night or early morning. Can come on during the day. No vomiting. Still eating within the middle of the night at times. Some snacking before bed. Had nausea this morning. Had chicken salad with tomatoes for lunch and dinner yesterday. Gram crackers in the middle of the night. No abdominal pain. Still eating some spicy foods. No dysphagia.   According to her scales at home, her weight is stable.  She is now having to weigh daily due to recent 8-day hospitalization in March 2021 for atrial fibrillation.  She takes 2 torsemide daily but if her weight increases more than 3 pounds in 1 day, she takes 3 torsemide.  States her weight fluctuates within 3 to 5 pounds.  States she was up 10 or more pounds prior to hospitalization and her goal weight is around 190 pounds.   BMs every couple of days. Stools are soft.  Sometimes hard.  Last BM was 2 days ago. Taking MiraLAX some days. Stool softener BID. No blood in the stool or black stool.   Past Medical History:  Diagnosis Date  . Age-related osteoporosis without current pathological fracture   . Anxiety disorder   . Atrial fibrillation (HCC)   . CAD (coronary artery disease)  12/2013-DES to RCA due to NSTEMI- staged PCI to LAD and CX (not ideal for CABG; Echocardiogram 01/16/10 EF 55%, severe LVH; Moderate AS; mild ot moderate TR  . Chronic kidney disease   . Chronic respiratory failure with hypoxia (HCC)   . COPD (chronic obstructive pulmonary disease) (HCC)   . Degenerative myopia with macular hole   . Diabetes (HCC)    TYPE 2  . Hyperlipidemia   . Hypothyroidism   . Irritable bowel syndrome without diarrhea   . NSTEMI (non-ST elevated myocardial infarction) (HCC)    NSTEMI 3/05;  s/p PTCA with stenting of left circumflex coronary artery 3/05; totally occluded small vessel OM  . Occlusion and stenosis of right carotid artery    Carotid Duplex 7/13 moderate disease (<70%) of the right internal carotid arteries, this is a high bifurcation, s/p left CEA without significant stenosis.  . Polyp of colon   . Presence of prosthetic heart valve    S/P aortic valve replacement 04/29/2008   . PVD (peripheral vascular disease) (HCC)   . Sicca syndrome (HCC)   . Stroke (cerebrum) (HCC)   . Unspecified abdominal hernia without obstruction or gangrene   . Vitamin D deficiency     Past Surgical History:  Procedure Laterality Date  . ABDOMINAL HERNIA REPAIR     per patient x 3  . AORTIC VALVE REPLACEMENT     S/P aortic valve replacement 04/29/2008   . CARDIAC CATHETERIZATION     by Flonnie Hailstone at [facility]; mLAD 30%, patent LCx stent, mRCA 50%, LVEDP 10, severe AS with AV mean gradient 46 mmHg, Right heart catheterization   . CARDIOVERSION N/A 06/08/2019   Procedure: CARDIOVERSION;  Surgeon: Antoine Poche, MD;  Location: [facility];  Service: Endoscopy;  Laterality: N/A;  . CAROTID ENDARTERECTOMY     Carotid Duplex 7/13 moderate disease (<70%) of the right internal carotid arteries, this is a high bifurcation, s/p left CEA without significant stenosis.  . COLONOSCOPY     per patient- several in the past in [facility], couple of polyps, last TCS between 5-10 years ago.   [name] MASTECTOMY Left 2013   breast cancer    Current Outpatient Medications  Medication Sig Dispense Refill  . alendronate (FOSAMAX) 70 MG tablet Take 70 mg by mouth once a week. Take with a full glass of water on an empty stomach. Take on Sunday's.    [name] apixaban (ELIQUIS) 5 MG TABS tablet Take 5 mg by mouth 2 (two) times daily.    . benzonatate (TESSALON) 200 MG capsule Take 200 mg by mouth 3 (three) times daily as needed for cough.    . budesonide (PULMICORT) 0.5 MG/2ML nebulizer solution Take 2 mLs (0.5 mg total)  by nebulization 2 (two) times daily. 120 mL 5  . Cholecalciferol (VITAMIN D3) 5000 units CAPS Take 1 capsule by mouth daily.    [name] diltiazem (CARDIZEM CD) 120 MG 24 hr capsule Take 1 capsule (120 mg total) by mouth daily. 90 capsule 3  . Ipratropium-Albuterol (COMBIVENT RESPIMAT) 20-100 MCG/ACT AERS respimat Inhale 1 puff into the lungs every 4 (four) hours as needed for wheezing or shortness of breath.     [name] ipratropium-albuterol (DUONEB) 0.5-2.5 (3) MG/3ML SOLN Take 3 mLs by nebulization every 6 (six) hours as needed (shortness of breath).     . isosorbide mononitrate (IMDUR) 60 MG 24 hr tablet Take 1 tablet (60 mg total) by mouth daily. 90 tablet 3  . levothyroxine (SYNTHROID, LEVOTHROID) 75 MCG tablet  Take 75 mcg by mouth See admin instructions. Take 75 mcg every Sun, Tues, Wed, Fri, and Sat.  Take 112.5 mcg every Mon and Thurs.    [name] LORazepam (ATIVAN) 0.5 MG tablet Take 0.5 mg by mouth 2 (two) times daily as needed for anxiety or sleep.     . metoprolol succinate (TOPROL-XL) 50 MG 24 hr tablet Take 1.5 tablets (75 mg total) by mouth 2 (two) times daily. Take with or immediately following a meal. 90 tablet 6  . Multiple Vitamins-Minerals (ICAPS AREDS FORMULA PO) Take 1 capsule by mouth 2 (two) times daily.     . nitroGLYCERIN (NITROSTAT) 0.4 MG SL tablet PLACE 1 TABLET UNDER THE TONGUE AT ONSET OF CHEST PAIN EVERY 5 MINTUES UP TO 3 TIMES AS NEEDED (Patient taking differently: Place 0.4 mg under the tongue every 5 (five) minutes as needed. ) 25 tablet 3  . nystatin-triamcinolone ointment (MYCOLOG) Apply 1 application topically 2 (two) times daily as needed (rash).     . ondansetron (ZOFRAN-ODT) 4 MG disintegrating tablet Take 4 mg by mouth every 8 (eight) hours as needed for nausea or vomiting.     . pantoprazole (PROTONIX) 40 MG tablet Take 1 tablet (40 mg total) by mouth daily. 30 tablet 2  . polyethylene glycol (MIRALAX / GLYCOLAX) packet Take 17 g by mouth daily.    . Tiotropium  Bromide-Olodaterol (STIOLTO RESPIMAT) 2.5-2.5 MCG/ACT AERS Inhale 2 puffs into the lungs daily. 4 g 11  . torsemide (DEMADEX) 20 MG tablet Take 2 tablets (40 mg total) by mouth daily. 30 tablet 3  . traMADol (ULTRAM) 50 MG tablet Take 50 mg by mouth every 6 (six) hours as needed for moderate pain.      No current facility-administered medications for this visit.    Allergies as of 08/10/2019 - Review Complete 08/10/2019  Allergen Reaction Noted  . Statins Other (See Comments) 06/23/2014  . Aleve [naproxen sodium] Other (See Comments) 12/16/2018  . Bee venom Swelling 05/27/2012  . Codeine Hives 05/27/2012  . Ibuprofen Other (See Comments) 12/16/2018  . Morphine and related Other (See Comments) 05/27/2012  . Norvasc [amlodipine besylate] Swelling   . Prednisone Swelling 06/04/2019  . Sulfa antibiotics  09/16/2010  . Tylenol [acetaminophen] Other (See Comments) 12/16/2018  . Valium [diazepam]  05/27/2012  . Zetia [ezetimibe]    . Penicillins Hives and Rash 05/27/2012    Family History  Problem Relation Age of Onset  . Hypertension Mother   . CAD Mother   . Hypertension Father   . CAD Father   . Hypertension Sister   . CAD Sister   . Hypertension Brother   . CAD Brother   . Diabetes Brother   . Hypertension Brother   . Diabetes Brother   . COPD Son     Social History   Socioeconomic History  . Marital status: Divorced    Spouse name: Not on file  . Number of children: 6  . Years of education: Not on file  . Highest education level: GED or equivalent  Occupational History  . Occupation: retired  Tobacco Use  . Smoking status: Former Smoker    Packs/day: 3.00    Types: Cigarettes    Quit date: 10/23/1976    Years since quitting: 42.8  . Smokeless tobacco: Never Used  Substance and Sexual Activity  . Alcohol use: No  . Drug use: No  . Sexual activity: Not Currently  Other Topics Concern  . Not on file  Social History  Narrative   Pt lives at home alone but has a  caregiver come in 7 days a week and talks with her daughter daily.   Social Determinants of Health   Financial Resource Strain: Low Risk   . Difficulty of Paying Living Expenses: Not hard at all  Food Insecurity: No Food Insecurity  . Worried About Charity fundraiser in the Last Year: Never true  . Ran Out of Food in the Last Year: Never true  Transportation Needs: No Transportation Needs  . Lack of Transportation (Medical): No  . Lack of Transportation (Non-Medical): No  Physical Activity: Inactive  . Days of Exercise per Week: 0 days  . Minutes of Exercise per Session: 0 min  Stress: Stress Concern Present  . Feeling of Stress : To some extent  Social Connections: Moderately Isolated  . Frequency of Communication with Friends and Family: More than three times a week  . Frequency of Social Gatherings with Friends and Family: Three times a week  . Attends Religious Services: Never  . Active Member of Clubs or Organizations: No  . Attends Archivist Meetings: Never  . Marital Status: Divorced    Review of Systems: Gen: Denies fever, chills, cold or flulike symptoms, presyncope, or syncope. CV: Denies chest pain or palpitations.  Cannot tell when she is in A. fib. Resp: Chronically short of breath on supplemental oxygen. Following with pulmonology.  GI: See HPI Heme: See HPI  Physical Exam: BP 110/80   Pulse (!) 122   Temp (!) 97 F (36.1 C) (Oral)   Ht 5\' 7"  (1.702 m)   Wt 197 lb 9.6 oz (89.6 kg)   BMI 30.95 kg/m  General:   Alert and oriented. No distress noted. Pleasant and cooperative.  Head:  Normocephalic and atraumatic. Eyes:  Conjuctiva clear without scleral icterus. Heart:  S1, S2 present without murmurs appreciated.  Rate and rhythm are irregularly irregular. Lungs: Nasal cannula in place.  Rales noted in the lung bases bilaterally. No wheezes or rhonchi. No distress.  Abdomen:  +BS, soft, non-tender and non-distended. No rebound or guarding. No HSM.   Ventral hernia present.  No tenderness palpation.  Soft.  Did not reduce completely although patient was examined sitting in chair.  Patient reports that resolves completely when laying down in bed.  Msk:  Symmetrical without gross deformities. Normal posture. Extremities: Mild lower extremity edema. Neurologic:  Alert and  oriented x4 Psych:  Normal mood and affect.

## 2019-08-10 ENCOUNTER — Other Ambulatory Visit: Payer: Self-pay

## 2019-08-10 ENCOUNTER — Telehealth: Payer: Self-pay | Admitting: Internal Medicine

## 2019-08-10 ENCOUNTER — Ambulatory Visit (INDEPENDENT_AMBULATORY_CARE_PROVIDER_SITE_OTHER): Payer: Medicare Other | Admitting: Gastroenterology

## 2019-08-10 ENCOUNTER — Encounter: Payer: Self-pay | Admitting: Gastroenterology

## 2019-08-10 VITALS — BP 110/80 | HR 122 | Temp 97.0°F | Ht 67.0 in | Wt 197.6 lb

## 2019-08-10 DIAGNOSIS — R110 Nausea: Secondary | ICD-10-CM

## 2019-08-10 DIAGNOSIS — K5900 Constipation, unspecified: Secondary | ICD-10-CM

## 2019-08-10 NOTE — Telephone Encounter (Signed)
Spoke with pt. Pt is taking Colace 100mg  and wanted to make sure that was ok. Ok per [facility] ov note to take stool softeners. Pt is aware that this is ok to take and will call our office back if needed.

## 2019-08-10 NOTE — Assessment & Plan Note (Signed)
Chronic.  Not adequately controlled with 2 stool softeners daily and MiraLAX some days.  No alarm symptoms.  Suspect constipation may be contributing to mild intermittent nausea without vomiting as discussed above.  Advise Julie Burns continue stool softeners and ensure Julie Burns is taking MiraLAX 1 capful (17 g) daily in 8 ounces of water. Requested progress report in 2 weeks.  This does not work well, could consider trying a prescriptive agent such as Linzess to help with constipation. Follow-up in 4 months.

## 2019-08-10 NOTE — Telephone Encounter (Signed)
PATIENT IS TAKING PROTONIX 40 MG 1 TIME A DAY(WAS SUPPOSED TO LET KRISTEN KNOW)  AND WANTS TO KNOW IF SHE CAN ADD COLACE AS A LAXATIVE  [phone]

## 2019-08-10 NOTE — Assessment & Plan Note (Signed)
Chronic intermittent nausea without vomiting for 5+ years.  Symptoms may occur a couple times in 1 day and may go 1 or more weeks without symptoms.  Zofran works well when needed. GERD well controlled.  Patient is not sure she is taking Protonix or Pepcid although last telephone note in March, patient opted to resume Pepcid rather than continue Protonix as she felt worsening esophageal burning on Protonix. No abdominal pain. Weight is fairly stable. Query whether nausea is related to dietary habits as she reports liking spicy foods, snack before bed, and eats in the middle of the night at times. May also be influenced by underlying constipation as she notes skipping a few days at a time between bowel movements currently taking stool softeners twice daily and MiraLAX some days.  As symptoms are chronic and she is without any alarm symptoms including significant weight loss, abdominal pain, BRBPR, or melena, I do not feel any additional work-up is needed at this time.  Notably, she has several chronic medical conditions as per HPI and was recently hospitalized in March 2021 for atrial fibrillation and acute on chronic CHF. I would not want to undergo any significant workup of chronic mild nausea that responds well to Zofran unless absolutely necessary.   Requested patient call when she gets home to let me know if she is taking Pepcid or Protonix.  If she is taking Pepcid, would consider adding in additional dose of Pepcid before bedtime to see if this helps prevent morning nausea. Counseled on a strict GERD diet.  Reinforced importance of avoiding a lot of spicy foods, not eating within 3 hours of laying down, not eating within the middle of the night, and propping head of bed up. Continue Zofran as needed. Continue stool softeners twice daily. Add MiraLAX 1 capful (17 g) daily in 8 ounces of water. Requested progress report in 2 weeks regarding constipation. Follow-up in 4 months.

## 2019-08-10 NOTE — Patient Instructions (Signed)
Please call me to let me know if you are taking Pepcid or Protonix.   Very important that you follow a GERD diet: Avoid fried, fatty, greasy, spicy, citrus foods. Avoid caffeine and carbonated beverages. Avoid chocolate. Try eating 4-6 small meals a day rather than 3 large meals. Do not eat within 3 hours of laying down. Do not eat within the middle of the night.  Prop head of bed up on wood or bricks to create a 6 inch incline.  Continue taking stool softeners twice daily.   Please start taking MiraLAX 1 capful (17g) every day.   Call with a progress report in 2 weeks with an update on how your bowels are moving. If you are not having a BM daily, we can try something else.   We will see you back in 4 months.   Ermalinda Memos, PA-C [facility]

## 2019-08-12 DIAGNOSIS — J449 Chronic obstructive pulmonary disease, unspecified: Secondary | ICD-10-CM | POA: Diagnosis not present

## 2019-08-12 DIAGNOSIS — J438 Other emphysema: Secondary | ICD-10-CM | POA: Diagnosis not present

## 2019-08-12 DIAGNOSIS — J9611 Chronic respiratory failure with hypoxia: Secondary | ICD-10-CM | POA: Diagnosis not present

## 2019-08-14 DIAGNOSIS — J449 Chronic obstructive pulmonary disease, unspecified: Secondary | ICD-10-CM | POA: Diagnosis not present

## 2019-08-15 DIAGNOSIS — W19XXXA Unspecified fall, initial encounter: Secondary | ICD-10-CM | POA: Diagnosis not present

## 2019-08-15 DIAGNOSIS — R5381 Other malaise: Secondary | ICD-10-CM | POA: Diagnosis not present

## 2019-08-15 DIAGNOSIS — T1490XA Injury, unspecified, initial encounter: Secondary | ICD-10-CM | POA: Diagnosis not present

## 2019-08-17 ENCOUNTER — Other Ambulatory Visit: Payer: Self-pay

## 2019-08-17 ENCOUNTER — Ambulatory Visit (INDEPENDENT_AMBULATORY_CARE_PROVIDER_SITE_OTHER): Payer: Medicare Other | Admitting: *Deleted

## 2019-08-17 DIAGNOSIS — I4891 Unspecified atrial fibrillation: Secondary | ICD-10-CM | POA: Diagnosis not present

## 2019-08-17 MED ORDER — METOPROLOL SUCCINATE ER 100 MG PO TB24: 100.0000 mg | ORAL_TABLET | Freq: Two times a day (BID) | ORAL | 6 refills | Status: AC

## 2019-08-17 MED ORDER — DILTIAZEM HCL ER COATED BEADS 240 MG PO CP24
240.0000 mg | ORAL_CAPSULE | Freq: Every day | ORAL | 11 refills | Status: AC
Start: 2019-08-17 — End: 2019-11-15

## 2019-08-17 MED ORDER — METOPROLOL SUCCINATE ER 100 MG PO TB24
100.0000 mg | ORAL_TABLET | Freq: Two times a day (BID) | ORAL | 6 refills | Status: DC
Start: 2019-08-17 — End: 2019-08-17

## 2019-08-17 NOTE — Patient Instructions (Signed)
Medication Instructions:  Take Cardizem CD 240 Daily  Take Toprol XL 100 Two Times Daily   Labwork: NONE   Testing/Procedures: NONE   Follow-Up: Your physician recommends that you schedule a follow-up appointment in: 1 Week for an EKG    Any Other Special Instructions Will Be Listed Below (If Applicable).     If you need a refill on your cardiac medications before your next appointment, please call your pharmacy.

## 2019-08-17 NOTE — Progress Notes (Signed)
Pt c/o weight gain of 7 lbs since Friday ( 08/14/19) Pt states. Pt states that SOB is worse after stopping amiodarone.

## 2019-08-19 ENCOUNTER — Telehealth: Payer: Self-pay | Admitting: Internal Medicine

## 2019-08-19 ENCOUNTER — Other Ambulatory Visit: Payer: Self-pay

## 2019-08-19 ENCOUNTER — Encounter: Payer: Self-pay | Admitting: Critical Care Medicine

## 2019-08-19 ENCOUNTER — Ambulatory Visit (INDEPENDENT_AMBULATORY_CARE_PROVIDER_SITE_OTHER): Payer: Medicare Other | Admitting: Critical Care Medicine

## 2019-08-19 VITALS — BP 122/70 | HR 62 | Temp 98.7°F | Ht 66.5 in | Wt 200.6 lb

## 2019-08-19 DIAGNOSIS — J9611 Chronic respiratory failure with hypoxia: Secondary | ICD-10-CM | POA: Diagnosis not present

## 2019-08-19 DIAGNOSIS — Z7689 Persons encountering health services in other specified circumstances: Secondary | ICD-10-CM | POA: Diagnosis not present

## 2019-08-19 DIAGNOSIS — J438 Other emphysema: Secondary | ICD-10-CM | POA: Diagnosis not present

## 2019-08-19 MED ORDER — ONDANSETRON 4 MG PO TBDP: 4.0000 mg | ORAL_TABLET | Freq: Three times a day (TID) | ORAL | 1 refills | Status: AC | PRN

## 2019-08-19 NOTE — Telephone Encounter (Signed)
When I saw patient in the office, her GERD symptoms are well controlled on Protonix 40 mg daily.  Her only complaint was mild intermittent nausea without vomiting.  I suspect that this may be influenced by her dietary habits and mild constipation.  This could also be influencing breakthrough reflux symptoms.  Has for constipation?  Is she taking stool softener and MiraLAX every day?  Is she having a bowel movement every day?  Please verify she is following a GERD diet:  Avoiding fried, fatty, greasy, spicy, citrus foods. (patient reported loving spicy foods) Avoiding caffeine and carbonated beverages. Avoiding chocolate. Eating 4-6 small meals a day rather than 3 large meals. Not eating within 3 hours of laying down. (she was snacking before bed and eating in the middle of the night) Have the head of her bed propped up to create a 6 inch incline.  If she is having frequent reflux symptoms on Protonix, I doubt Pepcid will provide much benefit.  However, if patient desires to resume Pepcid, we can try this.  If reflux symptoms continue and she is following a strict GERD diet, we may need to try different PPI.  Please let me know what she prefers.

## 2019-08-19 NOTE — Telephone Encounter (Signed)
Pt's caregiver, Alona Bene, called to say that protonix was not helping. Please advise. She will be there until 430 today. [phone]

## 2019-08-19 NOTE — Patient Instructions (Addendum)
Thank you for visiting Dr. Chestine Spore at [facility]. We recommend the following: Orders Placed This Encounter  Procedures  . Pulmonary function test   Orders Placed This Encounter  Procedures  . Pulmonary function test    Standing Status:   Future    Standing Expiration Date:   08/18/2020    Order Specific Question:   Where should this test be performed?    Answer:   [facility]    Order Specific Question:   Full PFT: includes the following: basic spirometry, spirometry pre & post bronchodilator, diffusion capacity (DLCO), lung volumes    Answer:   Full PFT   Continue Stiolto once daily. Keep using albuterol and nebulizers as needed.     Return in about 4 months (around 12/19/2019).    Please do your part to reduce the spread of COVID-19.

## 2019-08-19 NOTE — Telephone Encounter (Signed)
Spoke with pt and her caregiver. Pt is taking Pantoprazole 40 mg daily and feels it's not working well. Pt has had Gerd symptoms since she left our office. C/o burning sensations and feels that she is chocking. Pt was previously taking Pepcid and says didn't have gerd symptoms with Pepcid. Please advise.

## 2019-08-19 NOTE — Progress Notes (Signed)
Synopsis: Referred in March 2021 for chronic hypoxia, COPD by Kirstie Peri, MD  Subjective:   PATIENT ID: Julie Burns GENDER: female DOB: 11-Mar-1930, MRN: [id number]  Chief Complaint  Patient presents with  . Follow-up    pt has more difficulty breathing. Have problems with a fib. [place] for the last week.     Ms. [name] is an 86 y/o woman with a history of chronic respiratory failure, chronic heart failure, and likely COPD who presents for follow up.  She is accompanied today by her caregiver.  At her last visit she was started on Stiolto with improvement in her symptoms. She was infrequently requiring her rescue inhaler and was only using her Pulmicort nebs a few times per day.  She was hospitalized in late March for A. fib with RVR and decompensated heart failure.  She was started on Eliquis and amiodarone after cardioversion.  She was recently switched from amiodarone to metoprolol and Cardizem for rate control after failing rhythm control.  Since this time she has had worse abdominal distention, early satiety, less energy, and worse edema.  She has gained 7 pounds in the past week.  She has a follow-up appointment with her PCP in 2 days and has another cardiology appointment next week.  Today her legs have started weeping again.  She has not been eating or drinking much and has been watching her sodium intake.  Due to weakness they attribute to her heart failure, she had a fall 2 days ago and has been using a wheelchair more than walking with her walker.  Her breathing has been worse and she has required more nebulizers.  No wheezing, cough, sputum production.  She continues to use 3 L supplemental oxygen.    OV 05/20/19: [name] is an 86 y/o woman presenting for evaluation of COPD and chronic hypoxic respiratory failure. She presents with her caregiver Alona Bene. She is a former patient of Dr. Juanetta Gosling and is transferring her care.  She was diagnosed with COPD and started on supplemental  oxygen greater than 20 years ago.  Her current COPD regimen is Pulmicort nebs twice daily and duo nebs 4-5 times daily.  She is unsure why she is not on long-acting inhalers.  She does not think she has ever been on them in the past.  Her most significant symptom is shortness of breath, which has been worsening over the past few months.  She has occasional wheezing and infrequent coughing with light-colored sputum, which is her baseline.  She has not been sleeping well recently.  She sleeps in a recliner and has for several years.  She had Covid in November, but had relatively mild symptoms.  Over the past month her symptoms have been significantly worse, associated with worse leg edema, abdominal distention, and decreased saturations.  She presented to her PCPs office saturating 72% on 3 L pulsed despite sitting in a wheelchair.  Over the past few weeks she has had increased doses of diuretics and has lost at least 10 pounds.  Today she got out of the wheelchair long of to be weighed, was 83% liters pulsed oxygen riding in the wheelchair when she got to the room.  To get her saturations above 90% she required 3 L continuous oxygen.   With her worsening shortness of breath she has been using her duo nebs 4-5 times per day and requiring Combivent 4-5 times per day.  It improves her symptoms when she uses her neb and inhaler.  Her  activity has been much more restricted due to her shortness of breath.  She requires assistance and is very symptomatic with dressing.  She has a history of chronic HFpEF, bioprosthetic aortic valve replacement in 2010, A. fib on amiodarone and Eliquis, coronary artery disease status post stents to the RCA, LAD, circumflex in 2015.  She saw Dr. Antoine Poche on 03/18/2019 and has 1 year plan to follow-up.  Her PCP has been managing her acute heart failure exacerbation recently.  She quit smoking in 1978 after 40 years x 1 pack/day.     Past Medical History:  Diagnosis Date  . Age-related  osteoporosis without current pathological fracture   . Anxiety disorder   . Atrial fibrillation (HCC)   . CAD (coronary artery disease)    12/2013-DES to RCA due to NSTEMI- staged PCI to LAD and CX (not ideal for CABG; Echocardiogram 01/16/10 EF 55%, severe LVH; Moderate AS; mild ot moderate TR  . Chronic kidney disease   . Chronic respiratory failure with hypoxia (HCC)   . COPD (chronic obstructive pulmonary disease) (HCC)   . Degenerative myopia with macular hole   . Diabetes (HCC)    TYPE 2  . Hyperlipidemia   . Hypothyroidism   . Irritable bowel syndrome without diarrhea   . NSTEMI (non-ST elevated myocardial infarction) (HCC)    NSTEMI 3/05; s/p PTCA with stenting of left circumflex coronary artery 3/05; totally occluded small vessel OM  . Occlusion and stenosis of right carotid artery    Carotid Duplex 7/13 moderate disease (<70%) of the right internal carotid arteries, this is a high bifurcation, s/p left CEA without significant stenosis.  . Polyp of colon   . Presence of prosthetic heart valve    S/P aortic valve replacement 04/29/2008   . PVD (peripheral vascular disease) (HCC)   . Sicca syndrome (HCC)   . Stroke (cerebrum) (HCC)   . Unspecified abdominal hernia without obstruction or gangrene   . Vitamin D deficiency      Family History  Problem Relation Age of Onset  . Hypertension Mother   . CAD Mother   . Hypertension Father   . CAD Father   . Hypertension Sister   . CAD Sister   . Hypertension Brother   . CAD Brother   . Diabetes Brother   . Hypertension Brother   . Diabetes Brother   . COPD Son      Past Surgical History:  Procedure Laterality Date  . ABDOMINAL HERNIA REPAIR     per patient x 3  . AORTIC VALVE REPLACEMENT     S/P aortic valve replacement 04/29/2008   . CARDIAC CATHETERIZATION     by Flonnie Hailstone at [facility]; mLAD 30%, patent LCx stent, mRCA 50%, LVEDP 10, severe AS with AV mean gradient 46 mmHg, Right heart catheterization   .  CARDIOVERSION N/A 06/08/2019   Procedure: CARDIOVERSION;  Surgeon: Antoine Poche, MD;  Location: [facility];  Service: Endoscopy;  Laterality: N/A;  . CAROTID ENDARTERECTOMY     Carotid Duplex 7/13 moderate disease (<70%) of the right internal carotid arteries, this is a high bifurcation, s/p left CEA without significant stenosis.  . COLONOSCOPY     per patient- several in the past in [facility], couple of polyps, last TCS between 5-10 years ago.   [name] MASTECTOMY Left 2013   breast cancer    Social History   Socioeconomic History  . Marital status: Divorced    Spouse name: Not on file  .  Number of children: 6  . Years of education: Not on file  . Highest education level: GED or equivalent  Occupational History  . Occupation: retired  Tobacco Use  . Smoking status: Former Smoker    Packs/day: 3.00    Types: Cigarettes    Quit date: 10/23/1976    Years since quitting: 42.8  . Smokeless tobacco: Never Used  Vaping Use  . Vaping Use: Never used  Substance and Sexual Activity  . Alcohol use: No  . Drug use: No  . Sexual activity: Not Currently  Other Topics Concern  . Not on file  Social History Narrative   Pt lives at home alone but has a caregiver come in 7 days a week and talks with her daughter daily.   Social Determinants of Health   Financial Resource Strain: Low Risk   . Difficulty of Paying Living Expenses: Not hard at all  Food Insecurity: No Food Insecurity  . Worried About Programme researcher, broadcasting/film/video in the Last Year: Never true  . Ran Out of Food in the Last Year: Never true  Transportation Needs: No Transportation Needs  . Lack of Transportation (Medical): No  . Lack of Transportation (Non-Medical): No  Physical Activity: Inactive  . Days of Exercise per Week: 0 days  . Minutes of Exercise per Session: 0 min  Stress: Stress Concern Present  . Feeling of Stress : To some extent  Social Connections: Socially Isolated  . Frequency of Communication with Friends and  Family: More than three times a week  . Frequency of Social Gatherings with Friends and Family: Three times a week  . Attends Religious Services: Never  . Active Member of Clubs or Organizations: No  . Attends Banker Meetings: Never  . Marital Status: Divorced  Catering manager Violence: Not At Risk  . Fear of Current or Ex-Partner: No  . Emotionally Abused: No  . Physically Abused: No  . Sexually Abused: No     Allergies  Allergen Reactions  . Statins Other (See Comments)    Caused muscle aches and joint pain.  Armond Hang [Naproxen Sodium] Other (See Comments)    Makes me fell "drunk-like"  . Bee Venom Swelling  . Codeine Hives  . Ibuprofen Other (See Comments)    "drunk-like" feeling  . Morphine And Related Other (See Comments)    Patient states she "felt weird".  . Norvasc [Amlodipine Besylate] Swelling  . Prednisone Swelling  . Sulfa Antibiotics     whelps  . Tylenol [Acetaminophen] Other (See Comments)    "drunk-like feeling"  . Valium [Diazepam]   . Zetia [Ezetimibe]   . Penicillins Hives and Rash    Has patient had a PCN reaction causing immediate rash, facial/tongue/throat swelling, SOB or lightheadedness with hypotension: yes Has patient had a PCN reaction causing severe rash involving mucus membranes or skin necrosis: Unknown Has patient had a PCN reaction that required hospitalization: No Has patient had a PCN reaction occurring within the last 10 years: No If all of the above answers are "NO", then may proceed with Cephalosporin use.      Immunization History  Administered Date(s) Administered  . Fluad Quad(high Dose 65+) 11/18/2018  . Influenza Inj Mdck Quad Pf 11/18/2018  . Influenza Split 12/01/2009, 11/18/2011  . Influenza, High Dose Seasonal PF 02/12/2017, 12/12/2017  . Influenza, Seasonal, Injecte, Preservative Fre 10/30/2012, 12/01/2013  . Influenza,inj,Quad PF,6+ Mos 10/30/2012  . Influenza,inj,quad, With Preservative 01/14/2015,  12/30/2015  . Influenza-Unspecified 02/09/2011, 01/14/2015,  12/30/2015, 12/12/2017  . Pneumococcal Conjugate-13 01/14/2015  . Pneumococcal-Unspecified 12/04/1999, 02/19/2006  . Zoster 12/05/2011    Outpatient Medications Prior to Visit  Medication Sig Dispense Refill  . alendronate (FOSAMAX) 70 MG tablet Take 70 mg by mouth once a week. Take with a full glass of water on an empty stomach. Take on Sunday's.    [name] apixaban (ELIQUIS) 5 MG TABS tablet Take 5 mg by mouth 2 (two) times daily.    . benzonatate (TESSALON) 200 MG capsule Take 200 mg by mouth 3 (three) times daily as needed for cough.    . budesonide (PULMICORT) 0.5 MG/2ML nebulizer solution Take 2 mLs (0.5 mg total) by nebulization 2 (two) times daily. 120 mL 5  . Cholecalciferol (VITAMIN D3) 5000 units CAPS Take 1 capsule by mouth daily.    [name] diltiazem (CARDIZEM CD) 240 MG 24 hr capsule Take 1 capsule (240 mg total) by mouth daily. 30 capsule 11  . Ipratropium-Albuterol (COMBIVENT RESPIMAT) 20-100 MCG/ACT AERS respimat Inhale 1 puff into the lungs every 4 (four) hours as needed for wheezing or shortness of breath.     [name] ipratropium-albuterol (DUONEB) 0.5-2.5 (3) MG/3ML SOLN Take 3 mLs by nebulization every 6 (six) hours as needed (shortness of breath).     . isosorbide mononitrate (IMDUR) 60 MG 24 hr tablet Take 1 tablet (60 mg total) by mouth daily. 90 tablet 3  . levothyroxine (SYNTHROID, LEVOTHROID) 75 MCG tablet Take 75 mcg by mouth See admin instructions. Take 75 mcg every Sun, Tues, Wed, Fri, and Sat.  Take 112.5 mcg every Mon and Thurs.    [name] LORazepam (ATIVAN) 0.5 MG tablet Take 0.5 mg by mouth 2 (two) times daily as needed for anxiety or sleep.     . metoprolol succinate (TOPROL-XL) 100 MG 24 hr tablet Take 1 tablet (100 mg total) by mouth in the morning and at bedtime. Take with or immediately following a meal. 60 tablet 6  . Multiple Vitamins-Minerals (ICAPS AREDS FORMULA PO) Take 1 capsule by mouth 2 (two) times daily.     .  nitroGLYCERIN (NITROSTAT) 0.4 MG SL tablet PLACE 1 TABLET UNDER THE TONGUE AT ONSET OF CHEST PAIN EVERY 5 MINTUES UP TO 3 TIMES AS NEEDED (Patient taking differently: Place 0.4 mg under the tongue every 5 (five) minutes as needed. ) 25 tablet 3  . nystatin-triamcinolone ointment (MYCOLOG) Apply 1 application topically 2 (two) times daily as needed (rash).     . ondansetron (ZOFRAN-ODT) 4 MG disintegrating tablet Take 4 mg by mouth every 8 (eight) hours as needed for nausea or vomiting.     . pantoprazole (PROTONIX) 40 MG tablet Take 1 tablet (40 mg total) by mouth daily. 30 tablet 2  . polyethylene glycol (MIRALAX / GLYCOLAX) packet Take 17 g by mouth daily.    . Tiotropium Bromide-Olodaterol (STIOLTO RESPIMAT) 2.5-2.5 MCG/ACT AERS Inhale 2 puffs into the lungs daily. 4 g 11  . torsemide (DEMADEX) 20 MG tablet Take 2 tablets (40 mg total) by mouth daily. 30 tablet 3  . traMADol (ULTRAM) 50 MG tablet Take 50 mg by mouth every 6 (six) hours as needed for moderate pain.      No facility-administered medications prior to visit.    Review of Systems  Constitutional: Negative.   HENT: Negative for nosebleeds.   Eyes:       Chronic vision loss  Respiratory: Positive for cough, sputum production, shortness of breath and wheezing.   Cardiovascular: Positive for leg swelling.  Abdominal distention  Neurological: Positive for weakness.  Endo/Heme/Allergies: Negative for environmental allergies.     Objective:   Vitals:   08/19/19 1059  BP: 122/70  Pulse: 62  Temp: 98.7 F (37.1 C)  TempSrc: Oral  SpO2: 93%  Weight: 200 lb 9.6 oz (91 kg)  Height: 5' 6.5" (1.689 m)   93% on 3 LPM continuous BMI Readings from Last 3 Encounters:  08/19/19 31.89 kg/m  08/10/19 30.95 kg/m  08/05/19 32.68 kg/m   Wt Readings from Last 3 Encounters:  08/19/19 200 lb 9.6 oz (91 kg)  08/10/19 197 lb 9.6 oz (89.6 kg)  08/05/19 200 lb (90.7 kg)    Physical Exam Vitals reviewed.  HENT:     Head:  Normocephalic and atraumatic.  Eyes:     General: No scleral icterus. Cardiovascular:     Rate and Rhythm: Tachycardia present. Rhythm irregular.  Pulmonary:     Comments: Mild tachypnea, breathing comfortably on 3 L pulsed oxygen.  Bibasilar rales, no wheezing or rhonchi. Abdominal:     General: There is no distension.     Palpations: Abdomen is soft.     Tenderness: There is no abdominal tenderness.  Musculoskeletal:     Cervical back: Neck supple.     Comments: BLE edema, bruising and warmth around R knee  Lymphadenopathy:     Cervical: No cervical adenopathy.  Skin:    General: Skin is warm and dry.     Findings: No rash.  Neurological:     General: No focal deficit present.     Mental Status: She is alert.     Coordination: Coordination normal.  Psychiatric:        Mood and Affect: Mood normal.        Behavior: Behavior normal.      CBC    Component Value Date/Time   WBC 8.8 06/10/2019 0520   RBC 4.22 06/10/2019 0520   HGB 12.0 06/10/2019 0520   HCT 39.2 06/10/2019 0520   PLT 178 06/10/2019 0520   MCV 92.9 06/10/2019 0520   MCH 28.4 06/10/2019 0520   MCHC 30.6 06/10/2019 0520   RDW 14.6 06/10/2019 0520   LYMPHSABS 0.9 05/15/2017 1556   MONOABS 0.7 05/15/2017 1556   EOSABS 0.1 05/15/2017 1556   BASOSABS 0.0 05/15/2017 1556    CHEMISTRY No results for input(s): NA, K, CL, CO2, GLUCOSE, BUN, CREATININE, CALCIUM, MG, PHOS in the last 168 hours. CrCl cannot be calculated (Patient's most recent lab result is older than the maximum 21 days allowed.).   Chest Imaging- films reviewed: CXR, 2 view 05/15/2017-slightly rotated, R>L dependent pleural effusions with increased interstitial markings diffusely bilaterally.  CXR, 1 view 06/10/2019- bilateral dependent pleural effusions, improved on the right since 06/03/2019, but new on the left.  Cephalization.  Pulmonary Functions Testing Results: No flowsheet data found.   Echocardiogram 04/10/2019: LVEF 60 to 65%,  moderate LVH.  Normal wall motion.  Grade 2 diastolic dysfunction and elevated LVEDP.  Severely dilated LA, normal RV size and function with mildly increased wall thickness.  Moderately dilated RA.  Severe pulmonary hypertension.  Moderate mitral annular calcification with mild MS, moderate TR.  No evidence of aortic regurgitation or stenosis.  Aortic root dilation.     Assessment & Plan:     ICD-10-CM   1. Chronic respiratory failure with hypoxia (HCC)  J96.11 Pulmonary function test  2. Other emphysema (HCC)  J43.8 Pulmonary function test    Acute on chronic hypoxic respiratory failure likely  due to chronic heart failure, less likely due to COPD. -Con't supplemental O2 as prescribed -Continue diuretics, fluid restriction, sodium restriction -Continue follow-up with cardiology and PCP as planned. -Continue Stiolto once daily. -Continue Pulmicort nebs twice daily PRN. I am not sure that these are providing a significant benefit.  -Continue Combivent rescue inhaler as needed -PFTs recommended; at her current functional status I do not think this would be possible.  Acute on chronic HFpEF with acute pulmonary edema -Continue follow up as prescribed with cardiology and PCP. May require amiodarone if rate control is not achieved with BB and CCB.  RTC in 4 months.      Current Outpatient Medications:  .  alendronate (FOSAMAX) 70 MG tablet, Take 70 mg by mouth once a week. Take with a full glass of water on an empty stomach. Take on Sunday's., Disp: , Rfl:  .  apixaban (ELIQUIS) 5 MG TABS tablet, Take 5 mg by mouth 2 (two) times daily., Disp: , Rfl:  .  benzonatate (TESSALON) 200 MG capsule, Take 200 mg by mouth 3 (three) times daily as needed for cough., Disp: , Rfl:  .  budesonide (PULMICORT) 0.5 MG/2ML nebulizer solution, Take 2 mLs (0.5 mg total) by nebulization 2 (two) times daily., Disp: 120 mL, Rfl: 5 .  Cholecalciferol (VITAMIN D3) 5000 units CAPS, Take 1 capsule by mouth daily.,  Disp: , Rfl:  .  diltiazem (CARDIZEM CD) 240 MG 24 hr capsule, Take 1 capsule (240 mg total) by mouth daily., Disp: 30 capsule, Rfl: 11 .  Ipratropium-Albuterol (COMBIVENT RESPIMAT) 20-100 MCG/ACT AERS respimat, Inhale 1 puff into the lungs every 4 (four) hours as needed for wheezing or shortness of breath. , Disp: , Rfl:  .  ipratropium-albuterol (DUONEB) 0.5-2.5 (3) MG/3ML SOLN, Take 3 mLs by nebulization every 6 (six) hours as needed (shortness of breath). , Disp: , Rfl:  .  isosorbide mononitrate (IMDUR) 60 MG 24 hr tablet, Take 1 tablet (60 mg total) by mouth daily., Disp: 90 tablet, Rfl: 3 .  levothyroxine (SYNTHROID, LEVOTHROID) 75 MCG tablet, Take 75 mcg by mouth See admin instructions. Take 75 mcg every Sun, Tues, Wed, Fri, and Sat.  Take 112.5 mcg every Mon and Thurs., Disp: , Rfl:  .  LORazepam (ATIVAN) 0.5 MG tablet, Take 0.5 mg by mouth 2 (two) times daily as needed for anxiety or sleep. , Disp: , Rfl:  .  metoprolol succinate (TOPROL-XL) 100 MG 24 hr tablet, Take 1 tablet (100 mg total) by mouth in the morning and at bedtime. Take with or immediately following a meal., Disp: 60 tablet, Rfl: 6 .  Multiple Vitamins-Minerals (ICAPS AREDS FORMULA PO), Take 1 capsule by mouth 2 (two) times daily. , Disp: , Rfl:  .  nitroGLYCERIN (NITROSTAT) 0.4 MG SL tablet, PLACE 1 TABLET UNDER THE TONGUE AT ONSET OF CHEST PAIN EVERY 5 MINTUES UP TO 3 TIMES AS NEEDED (Patient taking differently: Place 0.4 mg under the tongue every 5 (five) minutes as needed. ), Disp: 25 tablet, Rfl: 3 .  nystatin-triamcinolone ointment (MYCOLOG), Apply 1 application topically 2 (two) times daily as needed (rash). , Disp: , Rfl:  .  ondansetron (ZOFRAN-ODT) 4 MG disintegrating tablet, Take 4 mg by mouth every 8 (eight) hours as needed for nausea or vomiting. , Disp: , Rfl:  .  pantoprazole (PROTONIX) 40 MG tablet, Take 1 tablet (40 mg total) by mouth daily., Disp: 30 tablet, Rfl: 2 .  polyethylene glycol (MIRALAX / GLYCOLAX)  packet, Take 17 g  by mouth daily., Disp: , Rfl:  .  Tiotropium Bromide-Olodaterol (STIOLTO RESPIMAT) 2.5-2.5 MCG/ACT AERS, Inhale 2 puffs into the lungs daily., Disp: 4 g, Rfl: 11 .  torsemide (DEMADEX) 20 MG tablet, Take 2 tablets (40 mg total) by mouth daily., Disp: 30 tablet, Rfl: 3 .  traMADol (ULTRAM) 50 MG tablet, Take 50 mg by mouth every 6 (six) hours as needed for moderate pain. , Disp: , Rfl:      Steffanie Dunn, DO [facility] 08/19/2019 11:25 AM

## 2019-08-20 NOTE — Telephone Encounter (Signed)
Noted  

## 2019-08-20 NOTE — Telephone Encounter (Signed)
Spoke with pt. Pt's caregiver that called with pt yesterday wasn't there today. Pt states she isn't constipated. She is taking the stool softener daily and the Miralax qod. Pts has a BM qod. Pt is trying to avoid all food that can trigger GERD. She is eating 2 meals a day and the meals aren't large. Pt says her appetite isn't as big as it use to be. Pt trys to avoid laying  before 3 hours to avoid Gerd Symptoms. Pt says the Genella Rife was bad for 1 week and was ok today. She feels that the Pepcid worked well and can't reminder why she stopped it. She is going to speak with her caregiver and have her call back if needed. For right now pt is still taking Pantoprazole.

## 2019-08-21 ENCOUNTER — Telehealth: Payer: Self-pay | Admitting: Internal Medicine

## 2019-08-21 DIAGNOSIS — J9611 Chronic respiratory failure with hypoxia: Secondary | ICD-10-CM | POA: Diagnosis not present

## 2019-08-21 DIAGNOSIS — Z8673 Personal history of transient ischemic attack (TIA), and cerebral infarction without residual deficits: Secondary | ICD-10-CM | POA: Diagnosis not present

## 2019-08-21 DIAGNOSIS — I6529 Occlusion and stenosis of unspecified carotid artery: Secondary | ICD-10-CM | POA: Diagnosis not present

## 2019-08-21 DIAGNOSIS — Z20822 Contact with and (suspected) exposure to covid-19: Secondary | ICD-10-CM | POA: Diagnosis not present

## 2019-08-21 DIAGNOSIS — Z882 Allergy status to sulfonamides status: Secondary | ICD-10-CM | POA: Diagnosis not present

## 2019-08-21 DIAGNOSIS — I5033 Acute on chronic diastolic (congestive) heart failure: Secondary | ICD-10-CM | POA: Diagnosis not present

## 2019-08-21 DIAGNOSIS — J439 Emphysema, unspecified: Secondary | ICD-10-CM | POA: Diagnosis not present

## 2019-08-21 DIAGNOSIS — E1122 Type 2 diabetes mellitus with diabetic chronic kidney disease: Secondary | ICD-10-CM | POA: Diagnosis not present

## 2019-08-21 DIAGNOSIS — Z951 Presence of aortocoronary bypass graft: Secondary | ICD-10-CM | POA: Diagnosis not present

## 2019-08-21 DIAGNOSIS — M810 Age-related osteoporosis without current pathological fracture: Secondary | ICD-10-CM | POA: Diagnosis not present

## 2019-08-21 DIAGNOSIS — I4819 Other persistent atrial fibrillation: Secondary | ICD-10-CM | POA: Diagnosis not present

## 2019-08-21 DIAGNOSIS — E1151 Type 2 diabetes mellitus with diabetic peripheral angiopathy without gangrene: Secondary | ICD-10-CM | POA: Diagnosis not present

## 2019-08-21 DIAGNOSIS — R9431 Abnormal electrocardiogram [ECG] [EKG]: Secondary | ICD-10-CM | POA: Diagnosis not present

## 2019-08-21 DIAGNOSIS — L03115 Cellulitis of right lower limb: Secondary | ICD-10-CM | POA: Diagnosis not present

## 2019-08-21 DIAGNOSIS — I252 Old myocardial infarction: Secondary | ICD-10-CM | POA: Diagnosis not present

## 2019-08-21 DIAGNOSIS — Z880 Allergy status to penicillin: Secondary | ICD-10-CM | POA: Diagnosis not present

## 2019-08-21 DIAGNOSIS — J90 Pleural effusion, not elsewhere classified: Secondary | ICD-10-CM | POA: Diagnosis not present

## 2019-08-21 DIAGNOSIS — Z7901 Long term (current) use of anticoagulants: Secondary | ICD-10-CM | POA: Diagnosis not present

## 2019-08-21 DIAGNOSIS — Z9861 Coronary angioplasty status: Secondary | ICD-10-CM | POA: Diagnosis not present

## 2019-08-21 DIAGNOSIS — I130 Hypertensive heart and chronic kidney disease with heart failure and stage 1 through stage 4 chronic kidney disease, or unspecified chronic kidney disease: Secondary | ICD-10-CM | POA: Diagnosis not present

## 2019-08-21 DIAGNOSIS — Z853 Personal history of malignant neoplasm of breast: Secondary | ICD-10-CM | POA: Diagnosis not present

## 2019-08-21 DIAGNOSIS — R0602 Shortness of breath: Secondary | ICD-10-CM | POA: Diagnosis not present

## 2019-08-21 DIAGNOSIS — I110 Hypertensive heart disease with heart failure: Secondary | ICD-10-CM | POA: Diagnosis not present

## 2019-08-21 DIAGNOSIS — I2510 Atherosclerotic heart disease of native coronary artery without angina pectoris: Secondary | ICD-10-CM | POA: Diagnosis not present

## 2019-08-21 DIAGNOSIS — Z66 Do not resuscitate: Secondary | ICD-10-CM | POA: Diagnosis not present

## 2019-08-21 DIAGNOSIS — Z86010 Personal history of colonic polyps: Secondary | ICD-10-CM | POA: Diagnosis not present

## 2019-08-21 DIAGNOSIS — Z955 Presence of coronary angioplasty implant and graft: Secondary | ICD-10-CM | POA: Diagnosis not present

## 2019-08-21 DIAGNOSIS — E039 Hypothyroidism, unspecified: Secondary | ICD-10-CM | POA: Diagnosis not present

## 2019-08-21 DIAGNOSIS — Z87891 Personal history of nicotine dependence: Secondary | ICD-10-CM | POA: Diagnosis not present

## 2019-08-21 NOTE — Telephone Encounter (Signed)
Patient's aide called to speak with AM. I told her AM was off today and I could take a message. She has questions about the patient's medication because the patient was unclear on how to take them. She is going out for another doctor appt at 7 and said to [facility]. [phone]

## 2019-08-24 ENCOUNTER — Ambulatory Visit: Payer: Medicare Other

## 2019-08-24 NOTE — Telephone Encounter (Signed)
Lmom, waiting on a return call.  

## 2019-08-29 DIAGNOSIS — R69 Illness, unspecified: Secondary | ICD-10-CM | POA: Diagnosis not present

## 2019-08-30 DIAGNOSIS — N182 Chronic kidney disease, stage 2 (mild): Secondary | ICD-10-CM | POA: Diagnosis not present

## 2019-08-30 DIAGNOSIS — E1122 Type 2 diabetes mellitus with diabetic chronic kidney disease: Secondary | ICD-10-CM | POA: Diagnosis not present

## 2019-08-30 DIAGNOSIS — I2510 Atherosclerotic heart disease of native coronary artery without angina pectoris: Secondary | ICD-10-CM | POA: Diagnosis not present

## 2019-08-30 DIAGNOSIS — I4819 Other persistent atrial fibrillation: Secondary | ICD-10-CM | POA: Diagnosis not present

## 2019-08-30 DIAGNOSIS — J9611 Chronic respiratory failure with hypoxia: Secondary | ICD-10-CM | POA: Diagnosis not present

## 2019-08-30 DIAGNOSIS — I252 Old myocardial infarction: Secondary | ICD-10-CM | POA: Diagnosis not present

## 2019-08-30 DIAGNOSIS — I5033 Acute on chronic diastolic (congestive) heart failure: Secondary | ICD-10-CM | POA: Diagnosis not present

## 2019-08-30 DIAGNOSIS — I130 Hypertensive heart and chronic kidney disease with heart failure and stage 1 through stage 4 chronic kidney disease, or unspecified chronic kidney disease: Secondary | ICD-10-CM | POA: Diagnosis not present

## 2019-08-30 DIAGNOSIS — I6521 Occlusion and stenosis of right carotid artery: Secondary | ICD-10-CM | POA: Diagnosis not present

## 2019-08-30 DIAGNOSIS — I272 Pulmonary hypertension, unspecified: Secondary | ICD-10-CM | POA: Diagnosis not present

## 2019-08-30 DIAGNOSIS — R238 Other skin changes: Secondary | ICD-10-CM | POA: Diagnosis not present

## 2019-08-30 DIAGNOSIS — I50810 Right heart failure, unspecified: Secondary | ICD-10-CM | POA: Diagnosis not present

## 2019-08-31 DIAGNOSIS — I5032 Chronic diastolic (congestive) heart failure: Secondary | ICD-10-CM | POA: Diagnosis not present

## 2019-08-31 DIAGNOSIS — Z299 Encounter for prophylactic measures, unspecified: Secondary | ICD-10-CM | POA: Diagnosis not present

## 2019-08-31 DIAGNOSIS — R296 Repeated falls: Secondary | ICD-10-CM | POA: Diagnosis not present

## 2019-08-31 DIAGNOSIS — R69 Illness, unspecified: Secondary | ICD-10-CM | POA: Diagnosis not present

## 2019-08-31 DIAGNOSIS — I10 Essential (primary) hypertension: Secondary | ICD-10-CM | POA: Diagnosis not present

## 2019-08-31 DIAGNOSIS — L0390 Cellulitis, unspecified: Secondary | ICD-10-CM | POA: Diagnosis not present

## 2019-08-31 NOTE — Telephone Encounter (Signed)
Spoke with pts caregiver, pt needs a refill on Pepcid. It was ok per [facility] for pt to go back on Pepcid. See other phone note. [facility] pharmacy, pt has refills on Pepcid. [facility] is going to get pts Pepcid filled and place Pantoprazole on hold for now. Pt's caregiver is aware and will pick Pepcid up today.

## 2019-09-01 DIAGNOSIS — I6521 Occlusion and stenosis of right carotid artery: Secondary | ICD-10-CM | POA: Diagnosis not present

## 2019-09-01 DIAGNOSIS — R339 Retention of urine, unspecified: Secondary | ICD-10-CM | POA: Diagnosis not present

## 2019-09-01 DIAGNOSIS — M6281 Muscle weakness (generalized): Secondary | ICD-10-CM | POA: Diagnosis not present

## 2019-09-01 DIAGNOSIS — E1122 Type 2 diabetes mellitus with diabetic chronic kidney disease: Secondary | ICD-10-CM | POA: Diagnosis not present

## 2019-09-01 DIAGNOSIS — J9611 Chronic respiratory failure with hypoxia: Secondary | ICD-10-CM | POA: Diagnosis not present

## 2019-09-01 DIAGNOSIS — I2510 Atherosclerotic heart disease of native coronary artery without angina pectoris: Secondary | ICD-10-CM | POA: Diagnosis not present

## 2019-09-01 DIAGNOSIS — R238 Other skin changes: Secondary | ICD-10-CM | POA: Diagnosis not present

## 2019-09-01 DIAGNOSIS — I272 Pulmonary hypertension, unspecified: Secondary | ICD-10-CM | POA: Diagnosis not present

## 2019-09-01 DIAGNOSIS — I130 Hypertensive heart and chronic kidney disease with heart failure and stage 1 through stage 4 chronic kidney disease, or unspecified chronic kidney disease: Secondary | ICD-10-CM | POA: Diagnosis not present

## 2019-09-01 DIAGNOSIS — N182 Chronic kidney disease, stage 2 (mild): Secondary | ICD-10-CM | POA: Diagnosis not present

## 2019-09-01 DIAGNOSIS — I4819 Other persistent atrial fibrillation: Secondary | ICD-10-CM | POA: Diagnosis not present

## 2019-09-01 DIAGNOSIS — I252 Old myocardial infarction: Secondary | ICD-10-CM | POA: Diagnosis not present

## 2019-09-01 DIAGNOSIS — I50810 Right heart failure, unspecified: Secondary | ICD-10-CM | POA: Diagnosis not present

## 2019-09-01 DIAGNOSIS — R2689 Other abnormalities of gait and mobility: Secondary | ICD-10-CM | POA: Diagnosis not present

## 2019-09-01 DIAGNOSIS — I5033 Acute on chronic diastolic (congestive) heart failure: Secondary | ICD-10-CM | POA: Diagnosis not present

## 2019-09-02 DIAGNOSIS — M6281 Muscle weakness (generalized): Secondary | ICD-10-CM | POA: Diagnosis not present

## 2019-09-02 DIAGNOSIS — R339 Retention of urine, unspecified: Secondary | ICD-10-CM | POA: Diagnosis not present

## 2019-09-02 DIAGNOSIS — R2689 Other abnormalities of gait and mobility: Secondary | ICD-10-CM | POA: Diagnosis not present

## 2019-09-03 DIAGNOSIS — I5033 Acute on chronic diastolic (congestive) heart failure: Secondary | ICD-10-CM | POA: Diagnosis not present

## 2019-09-03 DIAGNOSIS — J9611 Chronic respiratory failure with hypoxia: Secondary | ICD-10-CM | POA: Diagnosis not present

## 2019-09-03 DIAGNOSIS — I252 Old myocardial infarction: Secondary | ICD-10-CM | POA: Diagnosis not present

## 2019-09-03 DIAGNOSIS — I272 Pulmonary hypertension, unspecified: Secondary | ICD-10-CM | POA: Diagnosis not present

## 2019-09-03 DIAGNOSIS — N182 Chronic kidney disease, stage 2 (mild): Secondary | ICD-10-CM | POA: Diagnosis not present

## 2019-09-03 DIAGNOSIS — I2510 Atherosclerotic heart disease of native coronary artery without angina pectoris: Secondary | ICD-10-CM | POA: Diagnosis not present

## 2019-09-03 DIAGNOSIS — R238 Other skin changes: Secondary | ICD-10-CM | POA: Diagnosis not present

## 2019-09-03 DIAGNOSIS — E1122 Type 2 diabetes mellitus with diabetic chronic kidney disease: Secondary | ICD-10-CM | POA: Diagnosis not present

## 2019-09-03 DIAGNOSIS — I130 Hypertensive heart and chronic kidney disease with heart failure and stage 1 through stage 4 chronic kidney disease, or unspecified chronic kidney disease: Secondary | ICD-10-CM | POA: Diagnosis not present

## 2019-09-03 DIAGNOSIS — I6521 Occlusion and stenosis of right carotid artery: Secondary | ICD-10-CM | POA: Diagnosis not present

## 2019-09-03 DIAGNOSIS — I4819 Other persistent atrial fibrillation: Secondary | ICD-10-CM | POA: Diagnosis not present

## 2019-09-03 DIAGNOSIS — I50810 Right heart failure, unspecified: Secondary | ICD-10-CM | POA: Diagnosis not present

## 2019-09-08 DIAGNOSIS — I6521 Occlusion and stenosis of right carotid artery: Secondary | ICD-10-CM | POA: Diagnosis not present

## 2019-09-08 DIAGNOSIS — I252 Old myocardial infarction: Secondary | ICD-10-CM | POA: Diagnosis not present

## 2019-09-08 DIAGNOSIS — I130 Hypertensive heart and chronic kidney disease with heart failure and stage 1 through stage 4 chronic kidney disease, or unspecified chronic kidney disease: Secondary | ICD-10-CM | POA: Diagnosis not present

## 2019-09-08 DIAGNOSIS — J9611 Chronic respiratory failure with hypoxia: Secondary | ICD-10-CM | POA: Diagnosis not present

## 2019-09-08 DIAGNOSIS — I4819 Other persistent atrial fibrillation: Secondary | ICD-10-CM | POA: Diagnosis not present

## 2019-09-08 DIAGNOSIS — I272 Pulmonary hypertension, unspecified: Secondary | ICD-10-CM | POA: Diagnosis not present

## 2019-09-08 DIAGNOSIS — I2510 Atherosclerotic heart disease of native coronary artery without angina pectoris: Secondary | ICD-10-CM | POA: Diagnosis not present

## 2019-09-08 DIAGNOSIS — I5033 Acute on chronic diastolic (congestive) heart failure: Secondary | ICD-10-CM | POA: Diagnosis not present

## 2019-09-08 DIAGNOSIS — R238 Other skin changes: Secondary | ICD-10-CM | POA: Diagnosis not present

## 2019-09-08 DIAGNOSIS — E1122 Type 2 diabetes mellitus with diabetic chronic kidney disease: Secondary | ICD-10-CM | POA: Diagnosis not present

## 2019-09-08 DIAGNOSIS — I50810 Right heart failure, unspecified: Secondary | ICD-10-CM | POA: Diagnosis not present

## 2019-09-08 DIAGNOSIS — N182 Chronic kidney disease, stage 2 (mild): Secondary | ICD-10-CM | POA: Diagnosis not present

## 2019-09-09 ENCOUNTER — Ambulatory Visit: Payer: Medicare Other | Admitting: Cardiology

## 2019-09-09 DIAGNOSIS — R69 Illness, unspecified: Secondary | ICD-10-CM | POA: Diagnosis not present

## 2019-09-10 DIAGNOSIS — I272 Pulmonary hypertension, unspecified: Secondary | ICD-10-CM | POA: Diagnosis not present

## 2019-09-10 DIAGNOSIS — I4819 Other persistent atrial fibrillation: Secondary | ICD-10-CM | POA: Diagnosis not present

## 2019-09-10 DIAGNOSIS — J9611 Chronic respiratory failure with hypoxia: Secondary | ICD-10-CM | POA: Diagnosis not present

## 2019-09-10 DIAGNOSIS — I252 Old myocardial infarction: Secondary | ICD-10-CM | POA: Diagnosis not present

## 2019-09-10 DIAGNOSIS — I50810 Right heart failure, unspecified: Secondary | ICD-10-CM | POA: Diagnosis not present

## 2019-09-10 DIAGNOSIS — I2510 Atherosclerotic heart disease of native coronary artery without angina pectoris: Secondary | ICD-10-CM | POA: Diagnosis not present

## 2019-09-10 DIAGNOSIS — R238 Other skin changes: Secondary | ICD-10-CM | POA: Diagnosis not present

## 2019-09-10 DIAGNOSIS — I6521 Occlusion and stenosis of right carotid artery: Secondary | ICD-10-CM | POA: Diagnosis not present

## 2019-09-10 DIAGNOSIS — N182 Chronic kidney disease, stage 2 (mild): Secondary | ICD-10-CM | POA: Diagnosis not present

## 2019-09-10 DIAGNOSIS — I5033 Acute on chronic diastolic (congestive) heart failure: Secondary | ICD-10-CM | POA: Diagnosis not present

## 2019-09-10 DIAGNOSIS — E1122 Type 2 diabetes mellitus with diabetic chronic kidney disease: Secondary | ICD-10-CM | POA: Diagnosis not present

## 2019-09-10 DIAGNOSIS — I130 Hypertensive heart and chronic kidney disease with heart failure and stage 1 through stage 4 chronic kidney disease, or unspecified chronic kidney disease: Secondary | ICD-10-CM | POA: Diagnosis not present

## 2019-09-11 DIAGNOSIS — J9611 Chronic respiratory failure with hypoxia: Secondary | ICD-10-CM | POA: Diagnosis not present

## 2019-09-11 DIAGNOSIS — J438 Other emphysema: Secondary | ICD-10-CM | POA: Diagnosis not present

## 2019-09-11 DIAGNOSIS — J449 Chronic obstructive pulmonary disease, unspecified: Secondary | ICD-10-CM | POA: Diagnosis not present

## 2019-09-14 DIAGNOSIS — Z7951 Long term (current) use of inhaled steroids: Secondary | ICD-10-CM | POA: Diagnosis not present

## 2019-09-14 DIAGNOSIS — Z880 Allergy status to penicillin: Secondary | ICD-10-CM | POA: Diagnosis not present

## 2019-09-14 DIAGNOSIS — R5381 Other malaise: Secondary | ICD-10-CM | POA: Diagnosis not present

## 2019-09-14 DIAGNOSIS — I110 Hypertensive heart disease with heart failure: Secondary | ICD-10-CM | POA: Diagnosis not present

## 2019-09-14 DIAGNOSIS — I4891 Unspecified atrial fibrillation: Secondary | ICD-10-CM | POA: Diagnosis not present

## 2019-09-14 DIAGNOSIS — I10 Essential (primary) hypertension: Secondary | ICD-10-CM | POA: Diagnosis not present

## 2019-09-14 DIAGNOSIS — Z7901 Long term (current) use of anticoagulants: Secondary | ICD-10-CM | POA: Diagnosis not present

## 2019-09-14 DIAGNOSIS — J9611 Chronic respiratory failure with hypoxia: Secondary | ICD-10-CM | POA: Diagnosis not present

## 2019-09-14 DIAGNOSIS — E1122 Type 2 diabetes mellitus with diabetic chronic kidney disease: Secondary | ICD-10-CM | POA: Diagnosis not present

## 2019-09-14 DIAGNOSIS — I6521 Occlusion and stenosis of right carotid artery: Secondary | ICD-10-CM | POA: Diagnosis not present

## 2019-09-14 DIAGNOSIS — N182 Chronic kidney disease, stage 2 (mild): Secondary | ICD-10-CM | POA: Diagnosis not present

## 2019-09-14 DIAGNOSIS — Z9111 Patient's noncompliance with dietary regimen: Secondary | ICD-10-CM | POA: Diagnosis not present

## 2019-09-14 DIAGNOSIS — R609 Edema, unspecified: Secondary | ICD-10-CM | POA: Diagnosis not present

## 2019-09-14 DIAGNOSIS — Z743 Need for continuous supervision: Secondary | ICD-10-CM | POA: Diagnosis not present

## 2019-09-14 DIAGNOSIS — I130 Hypertensive heart and chronic kidney disease with heart failure and stage 1 through stage 4 chronic kidney disease, or unspecified chronic kidney disease: Secondary | ICD-10-CM | POA: Diagnosis not present

## 2019-09-14 DIAGNOSIS — M6281 Muscle weakness (generalized): Secondary | ICD-10-CM | POA: Diagnosis not present

## 2019-09-14 DIAGNOSIS — I252 Old myocardial infarction: Secondary | ICD-10-CM | POA: Diagnosis not present

## 2019-09-14 DIAGNOSIS — I5032 Chronic diastolic (congestive) heart failure: Secondary | ICD-10-CM | POA: Diagnosis not present

## 2019-09-14 DIAGNOSIS — Z853 Personal history of malignant neoplasm of breast: Secondary | ICD-10-CM | POA: Diagnosis not present

## 2019-09-14 DIAGNOSIS — I2510 Atherosclerotic heart disease of native coronary artery without angina pectoris: Secondary | ICD-10-CM | POA: Diagnosis not present

## 2019-09-14 DIAGNOSIS — I6529 Occlusion and stenosis of unspecified carotid artery: Secondary | ICD-10-CM | POA: Diagnosis not present

## 2019-09-14 DIAGNOSIS — Z953 Presence of xenogenic heart valve: Secondary | ICD-10-CM | POA: Diagnosis not present

## 2019-09-14 DIAGNOSIS — R238 Other skin changes: Secondary | ICD-10-CM | POA: Diagnosis not present

## 2019-09-14 DIAGNOSIS — R2689 Other abnormalities of gait and mobility: Secondary | ICD-10-CM | POA: Diagnosis not present

## 2019-09-14 DIAGNOSIS — I4819 Other persistent atrial fibrillation: Secondary | ICD-10-CM | POA: Diagnosis not present

## 2019-09-14 DIAGNOSIS — J449 Chronic obstructive pulmonary disease, unspecified: Secondary | ICD-10-CM | POA: Diagnosis not present

## 2019-09-14 DIAGNOSIS — Z66 Do not resuscitate: Secondary | ICD-10-CM | POA: Diagnosis not present

## 2019-09-14 DIAGNOSIS — Z7983 Long term (current) use of bisphosphonates: Secondary | ICD-10-CM | POA: Diagnosis not present

## 2019-09-14 DIAGNOSIS — L03116 Cellulitis of left lower limb: Secondary | ICD-10-CM | POA: Diagnosis not present

## 2019-09-14 DIAGNOSIS — I5033 Acute on chronic diastolic (congestive) heart failure: Secondary | ICD-10-CM | POA: Diagnosis not present

## 2019-09-14 DIAGNOSIS — Z87891 Personal history of nicotine dependence: Secondary | ICD-10-CM | POA: Diagnosis not present

## 2019-09-14 DIAGNOSIS — Z20822 Contact with and (suspected) exposure to covid-19: Secondary | ICD-10-CM | POA: Diagnosis not present

## 2019-09-14 DIAGNOSIS — I509 Heart failure, unspecified: Secondary | ICD-10-CM | POA: Diagnosis not present

## 2019-09-14 DIAGNOSIS — Z7401 Bed confinement status: Secondary | ICD-10-CM | POA: Diagnosis not present

## 2019-09-14 DIAGNOSIS — I50810 Right heart failure, unspecified: Secondary | ICD-10-CM | POA: Diagnosis not present

## 2019-09-14 DIAGNOSIS — J439 Emphysema, unspecified: Secondary | ICD-10-CM | POA: Diagnosis not present

## 2019-09-14 DIAGNOSIS — Z951 Presence of aortocoronary bypass graft: Secondary | ICD-10-CM | POA: Diagnosis not present

## 2019-09-14 DIAGNOSIS — I4821 Permanent atrial fibrillation: Secondary | ICD-10-CM | POA: Diagnosis not present

## 2019-09-14 DIAGNOSIS — I272 Pulmonary hypertension, unspecified: Secondary | ICD-10-CM | POA: Diagnosis not present

## 2019-09-14 DIAGNOSIS — I739 Peripheral vascular disease, unspecified: Secondary | ICD-10-CM | POA: Diagnosis not present

## 2019-09-14 DIAGNOSIS — I119 Hypertensive heart disease without heart failure: Secondary | ICD-10-CM | POA: Diagnosis not present

## 2019-09-14 DIAGNOSIS — Z882 Allergy status to sulfonamides status: Secondary | ICD-10-CM | POA: Diagnosis not present

## 2019-09-14 DIAGNOSIS — J90 Pleural effusion, not elsewhere classified: Secondary | ICD-10-CM | POA: Diagnosis not present

## 2019-09-14 DIAGNOSIS — R69 Illness, unspecified: Secondary | ICD-10-CM | POA: Diagnosis not present

## 2019-09-14 DIAGNOSIS — H3530 Unspecified macular degeneration: Secondary | ICD-10-CM | POA: Diagnosis not present

## 2019-09-21 DIAGNOSIS — I4821 Permanent atrial fibrillation: Secondary | ICD-10-CM | POA: Diagnosis not present

## 2019-09-21 DIAGNOSIS — M549 Dorsalgia, unspecified: Secondary | ICD-10-CM | POA: Diagnosis not present

## 2019-09-21 DIAGNOSIS — I110 Hypertensive heart disease with heart failure: Secondary | ICD-10-CM | POA: Diagnosis not present

## 2019-09-21 DIAGNOSIS — H353290 Exudative age-related macular degeneration, unspecified eye, stage unspecified: Secondary | ICD-10-CM | POA: Diagnosis not present

## 2019-09-21 DIAGNOSIS — I119 Hypertensive heart disease without heart failure: Secondary | ICD-10-CM | POA: Diagnosis not present

## 2019-09-21 DIAGNOSIS — I5032 Chronic diastolic (congestive) heart failure: Secondary | ICD-10-CM | POA: Diagnosis not present

## 2019-09-21 DIAGNOSIS — N390 Urinary tract infection, site not specified: Secondary | ICD-10-CM | POA: Diagnosis not present

## 2019-09-21 DIAGNOSIS — I6529 Occlusion and stenosis of unspecified carotid artery: Secondary | ICD-10-CM | POA: Diagnosis not present

## 2019-09-21 DIAGNOSIS — L03116 Cellulitis of left lower limb: Secondary | ICD-10-CM | POA: Diagnosis not present

## 2019-09-21 DIAGNOSIS — I509 Heart failure, unspecified: Secondary | ICD-10-CM | POA: Diagnosis not present

## 2019-09-21 DIAGNOSIS — J9611 Chronic respiratory failure with hypoxia: Secondary | ICD-10-CM | POA: Diagnosis not present

## 2019-09-21 DIAGNOSIS — M6281 Muscle weakness (generalized): Secondary | ICD-10-CM | POA: Diagnosis not present

## 2019-09-21 DIAGNOSIS — Z7401 Bed confinement status: Secondary | ICD-10-CM | POA: Diagnosis not present

## 2019-09-21 DIAGNOSIS — I700 Atherosclerosis of aorta: Secondary | ICD-10-CM | POA: Diagnosis not present

## 2019-09-21 DIAGNOSIS — I2510 Atherosclerotic heart disease of native coronary artery without angina pectoris: Secondary | ICD-10-CM | POA: Diagnosis not present

## 2019-09-21 DIAGNOSIS — N183 Chronic kidney disease, stage 3 unspecified: Secondary | ICD-10-CM | POA: Diagnosis not present

## 2019-09-21 DIAGNOSIS — I4891 Unspecified atrial fibrillation: Secondary | ICD-10-CM | POA: Diagnosis not present

## 2019-09-21 DIAGNOSIS — I739 Peripheral vascular disease, unspecified: Secondary | ICD-10-CM | POA: Diagnosis not present

## 2019-09-21 DIAGNOSIS — Z299 Encounter for prophylactic measures, unspecified: Secondary | ICD-10-CM | POA: Diagnosis not present

## 2019-09-21 DIAGNOSIS — R2689 Other abnormalities of gait and mobility: Secondary | ICD-10-CM | POA: Diagnosis not present

## 2019-09-21 DIAGNOSIS — J449 Chronic obstructive pulmonary disease, unspecified: Secondary | ICD-10-CM | POA: Diagnosis not present

## 2019-09-21 DIAGNOSIS — Z743 Need for continuous supervision: Secondary | ICD-10-CM | POA: Diagnosis not present

## 2019-09-21 DIAGNOSIS — I10 Essential (primary) hypertension: Secondary | ICD-10-CM | POA: Diagnosis not present

## 2019-09-21 DIAGNOSIS — I5033 Acute on chronic diastolic (congestive) heart failure: Secondary | ICD-10-CM | POA: Diagnosis not present

## 2019-09-21 DIAGNOSIS — H3530 Unspecified macular degeneration: Secondary | ICD-10-CM | POA: Diagnosis not present

## 2019-09-21 DIAGNOSIS — R69 Illness, unspecified: Secondary | ICD-10-CM | POA: Diagnosis not present

## 2019-09-21 DIAGNOSIS — I480 Paroxysmal atrial fibrillation: Secondary | ICD-10-CM | POA: Diagnosis not present

## 2019-09-22 DIAGNOSIS — Z299 Encounter for prophylactic measures, unspecified: Secondary | ICD-10-CM | POA: Diagnosis not present

## 2019-09-22 DIAGNOSIS — N183 Chronic kidney disease, stage 3 unspecified: Secondary | ICD-10-CM | POA: Diagnosis not present

## 2019-09-22 DIAGNOSIS — I5032 Chronic diastolic (congestive) heart failure: Secondary | ICD-10-CM | POA: Diagnosis not present

## 2019-09-22 DIAGNOSIS — I10 Essential (primary) hypertension: Secondary | ICD-10-CM | POA: Diagnosis not present

## 2019-09-22 DIAGNOSIS — J449 Chronic obstructive pulmonary disease, unspecified: Secondary | ICD-10-CM | POA: Diagnosis not present

## 2019-09-29 DIAGNOSIS — N390 Urinary tract infection, site not specified: Secondary | ICD-10-CM | POA: Diagnosis not present

## 2019-09-29 DIAGNOSIS — I700 Atherosclerosis of aorta: Secondary | ICD-10-CM | POA: Diagnosis not present

## 2019-09-29 DIAGNOSIS — I480 Paroxysmal atrial fibrillation: Secondary | ICD-10-CM | POA: Diagnosis not present

## 2019-09-29 DIAGNOSIS — H353290 Exudative age-related macular degeneration, unspecified eye, stage unspecified: Secondary | ICD-10-CM | POA: Diagnosis not present

## 2019-09-30 DIAGNOSIS — I2510 Atherosclerotic heart disease of native coronary artery without angina pectoris: Secondary | ICD-10-CM | POA: Diagnosis not present

## 2019-10-06 DIAGNOSIS — Z299 Encounter for prophylactic measures, unspecified: Secondary | ICD-10-CM | POA: Diagnosis not present

## 2019-10-06 DIAGNOSIS — I700 Atherosclerosis of aorta: Secondary | ICD-10-CM | POA: Diagnosis not present

## 2019-10-06 DIAGNOSIS — H353290 Exudative age-related macular degeneration, unspecified eye, stage unspecified: Secondary | ICD-10-CM | POA: Diagnosis not present

## 2019-10-06 DIAGNOSIS — J9611 Chronic respiratory failure with hypoxia: Secondary | ICD-10-CM | POA: Diagnosis not present

## 2019-10-07 DIAGNOSIS — N390 Urinary tract infection, site not specified: Secondary | ICD-10-CM | POA: Diagnosis not present

## 2019-10-08 DIAGNOSIS — J9611 Chronic respiratory failure with hypoxia: Secondary | ICD-10-CM | POA: Diagnosis not present

## 2019-10-08 DIAGNOSIS — N390 Urinary tract infection, site not specified: Secondary | ICD-10-CM | POA: Diagnosis not present

## 2019-10-08 DIAGNOSIS — Z299 Encounter for prophylactic measures, unspecified: Secondary | ICD-10-CM | POA: Diagnosis not present

## 2019-10-08 DIAGNOSIS — M549 Dorsalgia, unspecified: Secondary | ICD-10-CM | POA: Diagnosis not present

## 2019-10-08 DIAGNOSIS — I700 Atherosclerosis of aorta: Secondary | ICD-10-CM | POA: Diagnosis not present

## 2019-10-09 DIAGNOSIS — J9611 Chronic respiratory failure with hypoxia: Secondary | ICD-10-CM | POA: Diagnosis not present

## 2019-10-09 DIAGNOSIS — R238 Other skin changes: Secondary | ICD-10-CM | POA: Diagnosis not present

## 2019-10-09 DIAGNOSIS — I130 Hypertensive heart and chronic kidney disease with heart failure and stage 1 through stage 4 chronic kidney disease, or unspecified chronic kidney disease: Secondary | ICD-10-CM | POA: Diagnosis not present

## 2019-10-09 DIAGNOSIS — I252 Old myocardial infarction: Secondary | ICD-10-CM | POA: Diagnosis not present

## 2019-10-09 DIAGNOSIS — E1122 Type 2 diabetes mellitus with diabetic chronic kidney disease: Secondary | ICD-10-CM | POA: Diagnosis not present

## 2019-10-09 DIAGNOSIS — N182 Chronic kidney disease, stage 2 (mild): Secondary | ICD-10-CM | POA: Diagnosis not present

## 2019-10-09 DIAGNOSIS — I5033 Acute on chronic diastolic (congestive) heart failure: Secondary | ICD-10-CM | POA: Diagnosis not present

## 2019-10-09 DIAGNOSIS — I4819 Other persistent atrial fibrillation: Secondary | ICD-10-CM | POA: Diagnosis not present

## 2019-10-09 DIAGNOSIS — I50810 Right heart failure, unspecified: Secondary | ICD-10-CM | POA: Diagnosis not present

## 2019-10-09 DIAGNOSIS — I6521 Occlusion and stenosis of right carotid artery: Secondary | ICD-10-CM | POA: Diagnosis not present

## 2019-10-09 DIAGNOSIS — I2510 Atherosclerotic heart disease of native coronary artery without angina pectoris: Secondary | ICD-10-CM | POA: Diagnosis not present

## 2019-10-09 DIAGNOSIS — I272 Pulmonary hypertension, unspecified: Secondary | ICD-10-CM | POA: Diagnosis not present

## 2019-10-12 DIAGNOSIS — J9611 Chronic respiratory failure with hypoxia: Secondary | ICD-10-CM | POA: Diagnosis not present

## 2019-10-12 DIAGNOSIS — I252 Old myocardial infarction: Secondary | ICD-10-CM | POA: Diagnosis not present

## 2019-10-12 DIAGNOSIS — J449 Chronic obstructive pulmonary disease, unspecified: Secondary | ICD-10-CM | POA: Diagnosis not present

## 2019-10-12 DIAGNOSIS — R238 Other skin changes: Secondary | ICD-10-CM | POA: Diagnosis not present

## 2019-10-12 DIAGNOSIS — I2510 Atherosclerotic heart disease of native coronary artery without angina pectoris: Secondary | ICD-10-CM | POA: Diagnosis not present

## 2019-10-12 DIAGNOSIS — I130 Hypertensive heart and chronic kidney disease with heart failure and stage 1 through stage 4 chronic kidney disease, or unspecified chronic kidney disease: Secondary | ICD-10-CM | POA: Diagnosis not present

## 2019-10-12 DIAGNOSIS — N182 Chronic kidney disease, stage 2 (mild): Secondary | ICD-10-CM | POA: Diagnosis not present

## 2019-10-12 DIAGNOSIS — I50810 Right heart failure, unspecified: Secondary | ICD-10-CM | POA: Diagnosis not present

## 2019-10-12 DIAGNOSIS — J438 Other emphysema: Secondary | ICD-10-CM | POA: Diagnosis not present

## 2019-10-12 DIAGNOSIS — E1122 Type 2 diabetes mellitus with diabetic chronic kidney disease: Secondary | ICD-10-CM | POA: Diagnosis not present

## 2019-10-12 DIAGNOSIS — I4819 Other persistent atrial fibrillation: Secondary | ICD-10-CM | POA: Diagnosis not present

## 2019-10-12 DIAGNOSIS — I6521 Occlusion and stenosis of right carotid artery: Secondary | ICD-10-CM | POA: Diagnosis not present

## 2019-10-12 DIAGNOSIS — I5033 Acute on chronic diastolic (congestive) heart failure: Secondary | ICD-10-CM | POA: Diagnosis not present

## 2019-10-12 DIAGNOSIS — I272 Pulmonary hypertension, unspecified: Secondary | ICD-10-CM | POA: Diagnosis not present

## 2019-10-13 DIAGNOSIS — J9611 Chronic respiratory failure with hypoxia: Secondary | ICD-10-CM | POA: Diagnosis not present

## 2019-10-13 DIAGNOSIS — R238 Other skin changes: Secondary | ICD-10-CM | POA: Diagnosis not present

## 2019-10-13 DIAGNOSIS — I252 Old myocardial infarction: Secondary | ICD-10-CM | POA: Diagnosis not present

## 2019-10-13 DIAGNOSIS — I130 Hypertensive heart and chronic kidney disease with heart failure and stage 1 through stage 4 chronic kidney disease, or unspecified chronic kidney disease: Secondary | ICD-10-CM | POA: Diagnosis not present

## 2019-10-13 DIAGNOSIS — I272 Pulmonary hypertension, unspecified: Secondary | ICD-10-CM | POA: Diagnosis not present

## 2019-10-13 DIAGNOSIS — I50810 Right heart failure, unspecified: Secondary | ICD-10-CM | POA: Diagnosis not present

## 2019-10-13 DIAGNOSIS — I4819 Other persistent atrial fibrillation: Secondary | ICD-10-CM | POA: Diagnosis not present

## 2019-10-13 DIAGNOSIS — I2510 Atherosclerotic heart disease of native coronary artery without angina pectoris: Secondary | ICD-10-CM | POA: Diagnosis not present

## 2019-10-13 DIAGNOSIS — N182 Chronic kidney disease, stage 2 (mild): Secondary | ICD-10-CM | POA: Diagnosis not present

## 2019-10-13 DIAGNOSIS — I6521 Occlusion and stenosis of right carotid artery: Secondary | ICD-10-CM | POA: Diagnosis not present

## 2019-10-13 DIAGNOSIS — I5033 Acute on chronic diastolic (congestive) heart failure: Secondary | ICD-10-CM | POA: Diagnosis not present

## 2019-10-13 DIAGNOSIS — E1122 Type 2 diabetes mellitus with diabetic chronic kidney disease: Secondary | ICD-10-CM | POA: Diagnosis not present

## 2019-10-15 DIAGNOSIS — J9611 Chronic respiratory failure with hypoxia: Secondary | ICD-10-CM | POA: Diagnosis not present

## 2019-10-15 DIAGNOSIS — E1122 Type 2 diabetes mellitus with diabetic chronic kidney disease: Secondary | ICD-10-CM | POA: Diagnosis not present

## 2019-10-15 DIAGNOSIS — N182 Chronic kidney disease, stage 2 (mild): Secondary | ICD-10-CM | POA: Diagnosis not present

## 2019-10-15 DIAGNOSIS — I5033 Acute on chronic diastolic (congestive) heart failure: Secondary | ICD-10-CM | POA: Diagnosis not present

## 2019-10-15 DIAGNOSIS — I6521 Occlusion and stenosis of right carotid artery: Secondary | ICD-10-CM | POA: Diagnosis not present

## 2019-10-15 DIAGNOSIS — I50810 Right heart failure, unspecified: Secondary | ICD-10-CM | POA: Diagnosis not present

## 2019-10-15 DIAGNOSIS — R238 Other skin changes: Secondary | ICD-10-CM | POA: Diagnosis not present

## 2019-10-15 DIAGNOSIS — I2510 Atherosclerotic heart disease of native coronary artery without angina pectoris: Secondary | ICD-10-CM | POA: Diagnosis not present

## 2019-10-15 DIAGNOSIS — I252 Old myocardial infarction: Secondary | ICD-10-CM | POA: Diagnosis not present

## 2019-10-15 DIAGNOSIS — I4819 Other persistent atrial fibrillation: Secondary | ICD-10-CM | POA: Diagnosis not present

## 2019-10-15 DIAGNOSIS — I130 Hypertensive heart and chronic kidney disease with heart failure and stage 1 through stage 4 chronic kidney disease, or unspecified chronic kidney disease: Secondary | ICD-10-CM | POA: Diagnosis not present

## 2019-10-15 DIAGNOSIS — I272 Pulmonary hypertension, unspecified: Secondary | ICD-10-CM | POA: Diagnosis not present

## 2019-10-19 DIAGNOSIS — Z299 Encounter for prophylactic measures, unspecified: Secondary | ICD-10-CM | POA: Diagnosis not present

## 2019-10-19 DIAGNOSIS — I272 Pulmonary hypertension, unspecified: Secondary | ICD-10-CM | POA: Diagnosis not present

## 2019-10-19 DIAGNOSIS — I480 Paroxysmal atrial fibrillation: Secondary | ICD-10-CM | POA: Diagnosis not present

## 2019-10-19 DIAGNOSIS — J9611 Chronic respiratory failure with hypoxia: Secondary | ICD-10-CM | POA: Diagnosis not present

## 2019-10-19 DIAGNOSIS — R238 Other skin changes: Secondary | ICD-10-CM | POA: Diagnosis not present

## 2019-10-19 DIAGNOSIS — I10 Essential (primary) hypertension: Secondary | ICD-10-CM | POA: Diagnosis not present

## 2019-10-19 DIAGNOSIS — N182 Chronic kidney disease, stage 2 (mild): Secondary | ICD-10-CM | POA: Diagnosis not present

## 2019-10-19 DIAGNOSIS — I700 Atherosclerosis of aorta: Secondary | ICD-10-CM | POA: Diagnosis not present

## 2019-10-19 DIAGNOSIS — Z7689 Persons encountering health services in other specified circumstances: Secondary | ICD-10-CM | POA: Diagnosis not present

## 2019-10-19 DIAGNOSIS — I6521 Occlusion and stenosis of right carotid artery: Secondary | ICD-10-CM | POA: Diagnosis not present

## 2019-10-19 DIAGNOSIS — I5033 Acute on chronic diastolic (congestive) heart failure: Secondary | ICD-10-CM | POA: Diagnosis not present

## 2019-10-19 DIAGNOSIS — I252 Old myocardial infarction: Secondary | ICD-10-CM | POA: Diagnosis not present

## 2019-10-19 DIAGNOSIS — I130 Hypertensive heart and chronic kidney disease with heart failure and stage 1 through stage 4 chronic kidney disease, or unspecified chronic kidney disease: Secondary | ICD-10-CM | POA: Diagnosis not present

## 2019-10-19 DIAGNOSIS — I2510 Atherosclerotic heart disease of native coronary artery without angina pectoris: Secondary | ICD-10-CM | POA: Diagnosis not present

## 2019-10-19 DIAGNOSIS — I50810 Right heart failure, unspecified: Secondary | ICD-10-CM | POA: Diagnosis not present

## 2019-10-19 DIAGNOSIS — J449 Chronic obstructive pulmonary disease, unspecified: Secondary | ICD-10-CM | POA: Diagnosis not present

## 2019-10-19 DIAGNOSIS — I5032 Chronic diastolic (congestive) heart failure: Secondary | ICD-10-CM | POA: Diagnosis not present

## 2019-10-19 DIAGNOSIS — I4819 Other persistent atrial fibrillation: Secondary | ICD-10-CM | POA: Diagnosis not present

## 2019-10-19 DIAGNOSIS — E1122 Type 2 diabetes mellitus with diabetic chronic kidney disease: Secondary | ICD-10-CM | POA: Diagnosis not present

## 2019-10-20 DIAGNOSIS — J9611 Chronic respiratory failure with hypoxia: Secondary | ICD-10-CM | POA: Diagnosis not present

## 2019-10-20 DIAGNOSIS — Z7689 Persons encountering health services in other specified circumstances: Secondary | ICD-10-CM | POA: Diagnosis not present

## 2019-10-20 DIAGNOSIS — I2510 Atherosclerotic heart disease of native coronary artery without angina pectoris: Secondary | ICD-10-CM | POA: Diagnosis not present

## 2019-10-20 DIAGNOSIS — I4821 Permanent atrial fibrillation: Secondary | ICD-10-CM | POA: Diagnosis not present

## 2019-10-20 DIAGNOSIS — I5042 Chronic combined systolic (congestive) and diastolic (congestive) heart failure: Secondary | ICD-10-CM | POA: Diagnosis not present

## 2019-10-20 DIAGNOSIS — I10 Essential (primary) hypertension: Secondary | ICD-10-CM | POA: Diagnosis not present

## 2019-10-22 DIAGNOSIS — N182 Chronic kidney disease, stage 2 (mild): Secondary | ICD-10-CM | POA: Diagnosis not present

## 2019-10-22 DIAGNOSIS — I272 Pulmonary hypertension, unspecified: Secondary | ICD-10-CM | POA: Diagnosis not present

## 2019-10-22 DIAGNOSIS — I4819 Other persistent atrial fibrillation: Secondary | ICD-10-CM | POA: Diagnosis not present

## 2019-10-22 DIAGNOSIS — I2510 Atherosclerotic heart disease of native coronary artery without angina pectoris: Secondary | ICD-10-CM | POA: Diagnosis not present

## 2019-10-22 DIAGNOSIS — E1122 Type 2 diabetes mellitus with diabetic chronic kidney disease: Secondary | ICD-10-CM | POA: Diagnosis not present

## 2019-10-22 DIAGNOSIS — I252 Old myocardial infarction: Secondary | ICD-10-CM | POA: Diagnosis not present

## 2019-10-22 DIAGNOSIS — R238 Other skin changes: Secondary | ICD-10-CM | POA: Diagnosis not present

## 2019-10-22 DIAGNOSIS — I5033 Acute on chronic diastolic (congestive) heart failure: Secondary | ICD-10-CM | POA: Diagnosis not present

## 2019-10-22 DIAGNOSIS — I130 Hypertensive heart and chronic kidney disease with heart failure and stage 1 through stage 4 chronic kidney disease, or unspecified chronic kidney disease: Secondary | ICD-10-CM | POA: Diagnosis not present

## 2019-10-22 DIAGNOSIS — J9611 Chronic respiratory failure with hypoxia: Secondary | ICD-10-CM | POA: Diagnosis not present

## 2019-10-22 DIAGNOSIS — I6521 Occlusion and stenosis of right carotid artery: Secondary | ICD-10-CM | POA: Diagnosis not present

## 2019-10-22 DIAGNOSIS — I50810 Right heart failure, unspecified: Secondary | ICD-10-CM | POA: Diagnosis not present

## 2019-10-23 DIAGNOSIS — J449 Chronic obstructive pulmonary disease, unspecified: Secondary | ICD-10-CM | POA: Diagnosis not present

## 2019-10-23 DIAGNOSIS — N182 Chronic kidney disease, stage 2 (mild): Secondary | ICD-10-CM | POA: Diagnosis not present

## 2019-10-23 DIAGNOSIS — E1122 Type 2 diabetes mellitus with diabetic chronic kidney disease: Secondary | ICD-10-CM | POA: Diagnosis not present

## 2019-10-23 DIAGNOSIS — I252 Old myocardial infarction: Secondary | ICD-10-CM | POA: Diagnosis not present

## 2019-10-23 DIAGNOSIS — I2510 Atherosclerotic heart disease of native coronary artery without angina pectoris: Secondary | ICD-10-CM | POA: Diagnosis not present

## 2019-10-23 DIAGNOSIS — I50810 Right heart failure, unspecified: Secondary | ICD-10-CM | POA: Diagnosis not present

## 2019-10-23 DIAGNOSIS — I4819 Other persistent atrial fibrillation: Secondary | ICD-10-CM | POA: Diagnosis not present

## 2019-10-23 DIAGNOSIS — I6521 Occlusion and stenosis of right carotid artery: Secondary | ICD-10-CM | POA: Diagnosis not present

## 2019-10-23 DIAGNOSIS — I5033 Acute on chronic diastolic (congestive) heart failure: Secondary | ICD-10-CM | POA: Diagnosis not present

## 2019-10-23 DIAGNOSIS — I130 Hypertensive heart and chronic kidney disease with heart failure and stage 1 through stage 4 chronic kidney disease, or unspecified chronic kidney disease: Secondary | ICD-10-CM | POA: Diagnosis not present

## 2019-10-23 DIAGNOSIS — R238 Other skin changes: Secondary | ICD-10-CM | POA: Diagnosis not present

## 2019-10-23 DIAGNOSIS — J9611 Chronic respiratory failure with hypoxia: Secondary | ICD-10-CM | POA: Diagnosis not present

## 2019-10-23 DIAGNOSIS — I272 Pulmonary hypertension, unspecified: Secondary | ICD-10-CM | POA: Diagnosis not present

## 2019-10-27 DIAGNOSIS — I50810 Right heart failure, unspecified: Secondary | ICD-10-CM | POA: Diagnosis not present

## 2019-10-27 DIAGNOSIS — I252 Old myocardial infarction: Secondary | ICD-10-CM | POA: Diagnosis not present

## 2019-10-27 DIAGNOSIS — J9611 Chronic respiratory failure with hypoxia: Secondary | ICD-10-CM | POA: Diagnosis not present

## 2019-10-27 DIAGNOSIS — I4819 Other persistent atrial fibrillation: Secondary | ICD-10-CM | POA: Diagnosis not present

## 2019-10-27 DIAGNOSIS — E1122 Type 2 diabetes mellitus with diabetic chronic kidney disease: Secondary | ICD-10-CM | POA: Diagnosis not present

## 2019-10-27 DIAGNOSIS — I2510 Atherosclerotic heart disease of native coronary artery without angina pectoris: Secondary | ICD-10-CM | POA: Diagnosis not present

## 2019-10-27 DIAGNOSIS — I130 Hypertensive heart and chronic kidney disease with heart failure and stage 1 through stage 4 chronic kidney disease, or unspecified chronic kidney disease: Secondary | ICD-10-CM | POA: Diagnosis not present

## 2019-10-27 DIAGNOSIS — I5033 Acute on chronic diastolic (congestive) heart failure: Secondary | ICD-10-CM | POA: Diagnosis not present

## 2019-10-27 DIAGNOSIS — I6521 Occlusion and stenosis of right carotid artery: Secondary | ICD-10-CM | POA: Diagnosis not present

## 2019-10-27 DIAGNOSIS — R238 Other skin changes: Secondary | ICD-10-CM | POA: Diagnosis not present

## 2019-10-27 DIAGNOSIS — N182 Chronic kidney disease, stage 2 (mild): Secondary | ICD-10-CM | POA: Diagnosis not present

## 2019-10-27 DIAGNOSIS — I272 Pulmonary hypertension, unspecified: Secondary | ICD-10-CM | POA: Diagnosis not present

## 2019-10-28 DIAGNOSIS — I130 Hypertensive heart and chronic kidney disease with heart failure and stage 1 through stage 4 chronic kidney disease, or unspecified chronic kidney disease: Secondary | ICD-10-CM | POA: Diagnosis not present

## 2019-10-28 DIAGNOSIS — N182 Chronic kidney disease, stage 2 (mild): Secondary | ICD-10-CM | POA: Diagnosis not present

## 2019-10-28 DIAGNOSIS — I2510 Atherosclerotic heart disease of native coronary artery without angina pectoris: Secondary | ICD-10-CM | POA: Diagnosis not present

## 2019-10-28 DIAGNOSIS — I5033 Acute on chronic diastolic (congestive) heart failure: Secondary | ICD-10-CM | POA: Diagnosis not present

## 2019-10-28 DIAGNOSIS — L03116 Cellulitis of left lower limb: Secondary | ICD-10-CM | POA: Diagnosis not present

## 2019-10-28 DIAGNOSIS — J439 Emphysema, unspecified: Secondary | ICD-10-CM | POA: Diagnosis not present

## 2019-10-28 DIAGNOSIS — I4819 Other persistent atrial fibrillation: Secondary | ICD-10-CM | POA: Diagnosis not present

## 2019-10-28 DIAGNOSIS — I50810 Right heart failure, unspecified: Secondary | ICD-10-CM | POA: Diagnosis not present

## 2019-10-28 DIAGNOSIS — E1122 Type 2 diabetes mellitus with diabetic chronic kidney disease: Secondary | ICD-10-CM | POA: Diagnosis not present

## 2019-10-28 DIAGNOSIS — I6521 Occlusion and stenosis of right carotid artery: Secondary | ICD-10-CM | POA: Diagnosis not present

## 2019-10-28 DIAGNOSIS — I272 Pulmonary hypertension, unspecified: Secondary | ICD-10-CM | POA: Diagnosis not present

## 2019-10-28 DIAGNOSIS — I252 Old myocardial infarction: Secondary | ICD-10-CM | POA: Diagnosis not present

## 2019-10-29 DIAGNOSIS — B965 Pseudomonas (aeruginosa) (mallei) (pseudomallei) as the cause of diseases classified elsewhere: Secondary | ICD-10-CM | POA: Diagnosis not present

## 2019-10-29 DIAGNOSIS — Z7401 Bed confinement status: Secondary | ICD-10-CM | POA: Diagnosis not present

## 2019-10-29 DIAGNOSIS — I499 Cardiac arrhythmia, unspecified: Secondary | ICD-10-CM | POA: Diagnosis not present

## 2019-10-29 DIAGNOSIS — I517 Cardiomegaly: Secondary | ICD-10-CM | POA: Diagnosis not present

## 2019-10-29 DIAGNOSIS — N183 Chronic kidney disease, stage 3 unspecified: Secondary | ICD-10-CM | POA: Diagnosis not present

## 2019-10-29 DIAGNOSIS — J9611 Chronic respiratory failure with hypoxia: Secondary | ICD-10-CM | POA: Diagnosis not present

## 2019-10-29 DIAGNOSIS — Z853 Personal history of malignant neoplasm of breast: Secondary | ICD-10-CM | POA: Diagnosis not present

## 2019-10-29 DIAGNOSIS — R069 Unspecified abnormalities of breathing: Secondary | ICD-10-CM | POA: Diagnosis not present

## 2019-10-29 DIAGNOSIS — Z743 Need for continuous supervision: Secondary | ICD-10-CM | POA: Diagnosis not present

## 2019-10-29 DIAGNOSIS — Z87891 Personal history of nicotine dependence: Secondary | ICD-10-CM | POA: Diagnosis not present

## 2019-10-29 DIAGNOSIS — I509 Heart failure, unspecified: Secondary | ICD-10-CM | POA: Diagnosis not present

## 2019-10-29 DIAGNOSIS — M7989 Other specified soft tissue disorders: Secondary | ICD-10-CM | POA: Diagnosis not present

## 2019-10-29 DIAGNOSIS — J439 Emphysema, unspecified: Secondary | ICD-10-CM | POA: Diagnosis not present

## 2019-10-29 DIAGNOSIS — Z7951 Long term (current) use of inhaled steroids: Secondary | ICD-10-CM | POA: Diagnosis not present

## 2019-10-29 DIAGNOSIS — J449 Chronic obstructive pulmonary disease, unspecified: Secondary | ICD-10-CM | POA: Diagnosis not present

## 2019-10-29 DIAGNOSIS — J90 Pleural effusion, not elsewhere classified: Secondary | ICD-10-CM | POA: Diagnosis not present

## 2019-10-29 DIAGNOSIS — R600 Localized edema: Secondary | ICD-10-CM | POA: Diagnosis not present

## 2019-10-29 DIAGNOSIS — S8991XA Unspecified injury of right lower leg, initial encounter: Secondary | ICD-10-CM | POA: Diagnosis not present

## 2019-10-29 DIAGNOSIS — Z9981 Dependence on supplemental oxygen: Secondary | ICD-10-CM | POA: Diagnosis not present

## 2019-10-29 DIAGNOSIS — Z7901 Long term (current) use of anticoagulants: Secondary | ICD-10-CM | POA: Diagnosis not present

## 2019-10-29 DIAGNOSIS — Z7983 Long term (current) use of bisphosphonates: Secondary | ICD-10-CM | POA: Diagnosis not present

## 2019-10-29 DIAGNOSIS — J441 Chronic obstructive pulmonary disease with (acute) exacerbation: Secondary | ICD-10-CM | POA: Diagnosis not present

## 2019-10-29 DIAGNOSIS — M25561 Pain in right knee: Secondary | ICD-10-CM | POA: Diagnosis not present

## 2019-10-29 DIAGNOSIS — Z66 Do not resuscitate: Secondary | ICD-10-CM | POA: Diagnosis not present

## 2019-10-29 DIAGNOSIS — Z20822 Contact with and (suspected) exposure to covid-19: Secondary | ICD-10-CM | POA: Diagnosis not present

## 2019-10-29 DIAGNOSIS — I482 Chronic atrial fibrillation, unspecified: Secondary | ICD-10-CM | POA: Diagnosis not present

## 2019-10-29 DIAGNOSIS — L02416 Cutaneous abscess of left lower limb: Secondary | ICD-10-CM | POA: Diagnosis not present

## 2019-10-29 DIAGNOSIS — R0602 Shortness of breath: Secondary | ICD-10-CM | POA: Diagnosis not present

## 2019-10-29 DIAGNOSIS — R000 Tachycardia, unspecified: Secondary | ICD-10-CM | POA: Diagnosis not present

## 2019-10-29 DIAGNOSIS — S0990XA Unspecified injury of head, initial encounter: Secondary | ICD-10-CM | POA: Diagnosis not present

## 2019-10-29 DIAGNOSIS — I5032 Chronic diastolic (congestive) heart failure: Secondary | ICD-10-CM | POA: Diagnosis not present

## 2019-10-29 DIAGNOSIS — Z720 Tobacco use: Secondary | ICD-10-CM | POA: Diagnosis not present

## 2019-10-29 DIAGNOSIS — I130 Hypertensive heart and chronic kidney disease with heart failure and stage 1 through stage 4 chronic kidney disease, or unspecified chronic kidney disease: Secondary | ICD-10-CM | POA: Diagnosis not present

## 2019-10-29 DIAGNOSIS — R21 Rash and other nonspecific skin eruption: Secondary | ICD-10-CM | POA: Diagnosis not present

## 2019-10-29 DIAGNOSIS — L03116 Cellulitis of left lower limb: Secondary | ICD-10-CM | POA: Diagnosis not present

## 2019-10-29 DIAGNOSIS — I739 Peripheral vascular disease, unspecified: Secondary | ICD-10-CM | POA: Diagnosis not present

## 2019-10-29 DIAGNOSIS — R0902 Hypoxemia: Secondary | ICD-10-CM | POA: Diagnosis not present

## 2019-10-29 DIAGNOSIS — M1711 Unilateral primary osteoarthritis, right knee: Secondary | ICD-10-CM | POA: Diagnosis not present

## 2019-11-03 DIAGNOSIS — I130 Hypertensive heart and chronic kidney disease with heart failure and stage 1 through stage 4 chronic kidney disease, or unspecified chronic kidney disease: Secondary | ICD-10-CM | POA: Diagnosis not present

## 2019-11-03 DIAGNOSIS — Z720 Tobacco use: Secondary | ICD-10-CM | POA: Diagnosis not present

## 2019-11-03 DIAGNOSIS — I5032 Chronic diastolic (congestive) heart failure: Secondary | ICD-10-CM | POA: Diagnosis not present

## 2019-11-03 DIAGNOSIS — J449 Chronic obstructive pulmonary disease, unspecified: Secondary | ICD-10-CM | POA: Diagnosis not present

## 2019-11-11 DIAGNOSIS — H548 Legal blindness, as defined in USA: Secondary | ICD-10-CM | POA: Diagnosis not present

## 2019-11-11 DIAGNOSIS — T1490XA Injury, unspecified, initial encounter: Secondary | ICD-10-CM | POA: Diagnosis not present

## 2019-11-11 DIAGNOSIS — L03116 Cellulitis of left lower limb: Secondary | ICD-10-CM | POA: Diagnosis not present

## 2019-11-11 DIAGNOSIS — J449 Chronic obstructive pulmonary disease, unspecified: Secondary | ICD-10-CM | POA: Diagnosis not present

## 2019-11-11 DIAGNOSIS — L02416 Cutaneous abscess of left lower limb: Secondary | ICD-10-CM | POA: Diagnosis not present

## 2019-11-12 DIAGNOSIS — J438 Other emphysema: Secondary | ICD-10-CM | POA: Diagnosis not present

## 2019-11-12 DIAGNOSIS — J9611 Chronic respiratory failure with hypoxia: Secondary | ICD-10-CM | POA: Diagnosis not present

## 2019-11-12 DIAGNOSIS — J449 Chronic obstructive pulmonary disease, unspecified: Secondary | ICD-10-CM | POA: Diagnosis not present

## 2019-11-16 ENCOUNTER — Other Ambulatory Visit: Payer: Self-pay

## 2019-11-16 ENCOUNTER — Emergency Department (HOSPITAL_COMMUNITY)
Admission: EM | Admit: 2019-11-16 | Discharge: 2019-11-16 | Disposition: A | Discharge status: Home / Self Care | Payer: Medicare Other | Source: Home / Self Care | Attending: Emergency Medicine | Admitting: Emergency Medicine

## 2019-11-16 ENCOUNTER — Emergency Department (HOSPITAL_COMMUNITY): Payer: Medicare Other

## 2019-11-16 ENCOUNTER — Encounter (HOSPITAL_COMMUNITY): Payer: Self-pay | Admitting: Emergency Medicine

## 2019-11-16 DIAGNOSIS — R109 Unspecified abdominal pain: Secondary | ICD-10-CM | POA: Diagnosis not present

## 2019-11-16 DIAGNOSIS — I499 Cardiac arrhythmia, unspecified: Secondary | ICD-10-CM | POA: Diagnosis not present

## 2019-11-16 DIAGNOSIS — N289 Disorder of kidney and ureter, unspecified: Secondary | ICD-10-CM | POA: Diagnosis not present

## 2019-11-16 DIAGNOSIS — Z66 Do not resuscitate: Secondary | ICD-10-CM | POA: Diagnosis not present

## 2019-11-16 DIAGNOSIS — R0902 Hypoxemia: Secondary | ICD-10-CM | POA: Diagnosis not present

## 2019-11-16 DIAGNOSIS — J9622 Acute and chronic respiratory failure with hypercapnia: Secondary | ICD-10-CM | POA: Diagnosis not present

## 2019-11-16 DIAGNOSIS — I672 Cerebral atherosclerosis: Secondary | ICD-10-CM | POA: Diagnosis not present

## 2019-11-16 DIAGNOSIS — S0081XA Abrasion of other part of head, initial encounter: Secondary | ICD-10-CM | POA: Insufficient documentation

## 2019-11-16 DIAGNOSIS — J449 Chronic obstructive pulmonary disease, unspecified: Secondary | ICD-10-CM | POA: Diagnosis not present

## 2019-11-16 DIAGNOSIS — Z299 Encounter for prophylactic measures, unspecified: Secondary | ICD-10-CM | POA: Diagnosis not present

## 2019-11-16 DIAGNOSIS — S0993XA Unspecified injury of face, initial encounter: Secondary | ICD-10-CM | POA: Diagnosis not present

## 2019-11-16 DIAGNOSIS — I130 Hypertensive heart and chronic kidney disease with heart failure and stage 1 through stage 4 chronic kidney disease, or unspecified chronic kidney disease: Secondary | ICD-10-CM | POA: Insufficient documentation

## 2019-11-16 DIAGNOSIS — R000 Tachycardia, unspecified: Secondary | ICD-10-CM | POA: Diagnosis not present

## 2019-11-16 DIAGNOSIS — J9621 Acute and chronic respiratory failure with hypoxia: Secondary | ICD-10-CM | POA: Diagnosis not present

## 2019-11-16 DIAGNOSIS — I25810 Atherosclerosis of coronary artery bypass graft(s) without angina pectoris: Secondary | ICD-10-CM | POA: Insufficient documentation

## 2019-11-16 DIAGNOSIS — S199XXA Unspecified injury of neck, initial encounter: Secondary | ICD-10-CM | POA: Diagnosis not present

## 2019-11-16 DIAGNOSIS — E871 Hypo-osmolality and hyponatremia: Secondary | ICD-10-CM | POA: Diagnosis not present

## 2019-11-16 DIAGNOSIS — R29898 Other symptoms and signs involving the musculoskeletal system: Secondary | ICD-10-CM | POA: Diagnosis not present

## 2019-11-16 DIAGNOSIS — R296 Repeated falls: Secondary | ICD-10-CM | POA: Diagnosis not present

## 2019-11-16 DIAGNOSIS — E1122 Type 2 diabetes mellitus with diabetic chronic kidney disease: Secondary | ICD-10-CM | POA: Insufficient documentation

## 2019-11-16 DIAGNOSIS — E872 Acidosis: Secondary | ICD-10-CM | POA: Diagnosis not present

## 2019-11-16 DIAGNOSIS — M7989 Other specified soft tissue disorders: Secondary | ICD-10-CM | POA: Diagnosis not present

## 2019-11-16 DIAGNOSIS — J9811 Atelectasis: Secondary | ICD-10-CM | POA: Diagnosis not present

## 2019-11-16 DIAGNOSIS — Z952 Presence of prosthetic heart valve: Secondary | ICD-10-CM | POA: Diagnosis not present

## 2019-11-16 DIAGNOSIS — Z7401 Bed confinement status: Secondary | ICD-10-CM | POA: Diagnosis not present

## 2019-11-16 DIAGNOSIS — Z743 Need for continuous supervision: Secondary | ICD-10-CM | POA: Diagnosis not present

## 2019-11-16 DIAGNOSIS — R6521 Severe sepsis with septic shock: Secondary | ICD-10-CM | POA: Diagnosis not present

## 2019-11-16 DIAGNOSIS — Z87891 Personal history of nicotine dependence: Secondary | ICD-10-CM | POA: Insufficient documentation

## 2019-11-16 DIAGNOSIS — I2510 Atherosclerotic heart disease of native coronary artery without angina pectoris: Secondary | ICD-10-CM | POA: Diagnosis not present

## 2019-11-16 DIAGNOSIS — Y92239 Unspecified place in hospital as the place of occurrence of the external cause: Secondary | ICD-10-CM | POA: Insufficient documentation

## 2019-11-16 DIAGNOSIS — S99911A Unspecified injury of right ankle, initial encounter: Secondary | ICD-10-CM | POA: Diagnosis not present

## 2019-11-16 DIAGNOSIS — Z7901 Long term (current) use of anticoagulants: Secondary | ICD-10-CM | POA: Insufficient documentation

## 2019-11-16 DIAGNOSIS — N1832 Chronic kidney disease, stage 3b: Secondary | ICD-10-CM | POA: Diagnosis not present

## 2019-11-16 DIAGNOSIS — I517 Cardiomegaly: Secondary | ICD-10-CM | POA: Diagnosis not present

## 2019-11-16 DIAGNOSIS — Z79890 Hormone replacement therapy (postmenopausal): Secondary | ICD-10-CM | POA: Insufficient documentation

## 2019-11-16 DIAGNOSIS — M25471 Effusion, right ankle: Secondary | ICD-10-CM

## 2019-11-16 DIAGNOSIS — K219 Gastro-esophageal reflux disease without esophagitis: Secondary | ICD-10-CM | POA: Diagnosis not present

## 2019-11-16 DIAGNOSIS — M25571 Pain in right ankle and joints of right foot: Secondary | ICD-10-CM | POA: Insufficient documentation

## 2019-11-16 DIAGNOSIS — J438 Other emphysema: Secondary | ICD-10-CM | POA: Diagnosis not present

## 2019-11-16 DIAGNOSIS — Y9384 Activity, sleeping: Secondary | ICD-10-CM | POA: Insufficient documentation

## 2019-11-16 DIAGNOSIS — I6523 Occlusion and stenosis of bilateral carotid arteries: Secondary | ICD-10-CM | POA: Diagnosis not present

## 2019-11-16 DIAGNOSIS — Z86010 Personal history of colonic polyps: Secondary | ICD-10-CM | POA: Insufficient documentation

## 2019-11-16 DIAGNOSIS — R079 Chest pain, unspecified: Secondary | ICD-10-CM | POA: Diagnosis not present

## 2019-11-16 DIAGNOSIS — W06XXXA Fall from bed, initial encounter: Secondary | ICD-10-CM | POA: Insufficient documentation

## 2019-11-16 DIAGNOSIS — Z515 Encounter for palliative care: Secondary | ICD-10-CM | POA: Diagnosis not present

## 2019-11-16 DIAGNOSIS — S0990XA Unspecified injury of head, initial encounter: Secondary | ICD-10-CM | POA: Diagnosis not present

## 2019-11-16 DIAGNOSIS — S20212A Contusion of left front wall of thorax, initial encounter: Secondary | ICD-10-CM

## 2019-11-16 DIAGNOSIS — I10 Essential (primary) hypertension: Secondary | ICD-10-CM | POA: Diagnosis not present

## 2019-11-16 DIAGNOSIS — I708 Atherosclerosis of other arteries: Secondary | ICD-10-CM | POA: Diagnosis not present

## 2019-11-16 DIAGNOSIS — N179 Acute kidney failure, unspecified: Secondary | ICD-10-CM | POA: Diagnosis not present

## 2019-11-16 DIAGNOSIS — R58 Hemorrhage, not elsewhere classified: Secondary | ICD-10-CM | POA: Diagnosis not present

## 2019-11-16 DIAGNOSIS — I5033 Acute on chronic diastolic (congestive) heart failure: Secondary | ICD-10-CM | POA: Insufficient documentation

## 2019-11-16 DIAGNOSIS — R6889 Other general symptoms and signs: Secondary | ICD-10-CM | POA: Diagnosis not present

## 2019-11-16 DIAGNOSIS — D72829 Elevated white blood cell count, unspecified: Secondary | ICD-10-CM | POA: Diagnosis not present

## 2019-11-16 DIAGNOSIS — J9602 Acute respiratory failure with hypercapnia: Secondary | ICD-10-CM | POA: Diagnosis not present

## 2019-11-16 DIAGNOSIS — A419 Sepsis, unspecified organism: Secondary | ICD-10-CM | POA: Diagnosis not present

## 2019-11-16 DIAGNOSIS — M5489 Other dorsalgia: Secondary | ICD-10-CM | POA: Diagnosis not present

## 2019-11-16 DIAGNOSIS — R5382 Chronic fatigue, unspecified: Secondary | ICD-10-CM | POA: Diagnosis not present

## 2019-11-16 DIAGNOSIS — R531 Weakness: Secondary | ICD-10-CM | POA: Diagnosis not present

## 2019-11-16 DIAGNOSIS — R0789 Other chest pain: Secondary | ICD-10-CM | POA: Diagnosis not present

## 2019-11-16 DIAGNOSIS — J90 Pleural effusion, not elsewhere classified: Secondary | ICD-10-CM | POA: Diagnosis not present

## 2019-11-16 DIAGNOSIS — R2241 Localized swelling, mass and lump, right lower limb: Secondary | ICD-10-CM | POA: Diagnosis not present

## 2019-11-16 DIAGNOSIS — I771 Stricture of artery: Secondary | ICD-10-CM | POA: Diagnosis not present

## 2019-11-16 DIAGNOSIS — Z79899 Other long term (current) drug therapy: Secondary | ICD-10-CM | POA: Insufficient documentation

## 2019-11-16 DIAGNOSIS — J441 Chronic obstructive pulmonary disease with (acute) exacerbation: Secondary | ICD-10-CM | POA: Insufficient documentation

## 2019-11-16 DIAGNOSIS — E860 Dehydration: Secondary | ICD-10-CM | POA: Diagnosis not present

## 2019-11-16 DIAGNOSIS — N3000 Acute cystitis without hematuria: Secondary | ICD-10-CM | POA: Diagnosis not present

## 2019-11-16 DIAGNOSIS — R001 Bradycardia, unspecified: Secondary | ICD-10-CM | POA: Diagnosis not present

## 2019-11-16 DIAGNOSIS — M542 Cervicalgia: Secondary | ICD-10-CM | POA: Diagnosis not present

## 2019-11-16 DIAGNOSIS — I471 Supraventricular tachycardia: Secondary | ICD-10-CM | POA: Diagnosis not present

## 2019-11-16 DIAGNOSIS — E039 Hypothyroidism, unspecified: Secondary | ICD-10-CM | POA: Insufficient documentation

## 2019-11-16 DIAGNOSIS — R0602 Shortness of breath: Secondary | ICD-10-CM | POA: Diagnosis not present

## 2019-11-16 DIAGNOSIS — W19XXXA Unspecified fall, initial encounter: Secondary | ICD-10-CM | POA: Diagnosis not present

## 2019-11-16 DIAGNOSIS — Y999 Unspecified external cause status: Secondary | ICD-10-CM | POA: Insufficient documentation

## 2019-11-16 DIAGNOSIS — I4892 Unspecified atrial flutter: Secondary | ICD-10-CM | POA: Diagnosis not present

## 2019-11-16 DIAGNOSIS — I4891 Unspecified atrial fibrillation: Secondary | ICD-10-CM | POA: Diagnosis not present

## 2019-11-16 DIAGNOSIS — I5032 Chronic diastolic (congestive) heart failure: Secondary | ICD-10-CM | POA: Diagnosis not present

## 2019-11-16 DIAGNOSIS — N189 Chronic kidney disease, unspecified: Secondary | ICD-10-CM | POA: Insufficient documentation

## 2019-11-16 DIAGNOSIS — I700 Atherosclerosis of aorta: Secondary | ICD-10-CM | POA: Diagnosis not present

## 2019-11-16 DIAGNOSIS — Z951 Presence of aortocoronary bypass graft: Secondary | ICD-10-CM | POA: Diagnosis not present

## 2019-11-16 DIAGNOSIS — S20219A Contusion of unspecified front wall of thorax, initial encounter: Secondary | ICD-10-CM | POA: Insufficient documentation

## 2019-11-16 DIAGNOSIS — G9341 Metabolic encephalopathy: Secondary | ICD-10-CM | POA: Diagnosis not present

## 2019-11-16 DIAGNOSIS — S299XXA Unspecified injury of thorax, initial encounter: Secondary | ICD-10-CM | POA: Diagnosis not present

## 2019-11-16 MED ORDER — DOUBLE ANTIBIOTIC 500-10000 UNIT/GM EX OINT: TOPICAL_OINTMENT | Freq: Once | CUTANEOUS | Status: AC

## 2019-11-16 NOTE — Discharge Instructions (Addendum)
Ice packs to the injured areas.  Wear the ankle support, if your ankle is still hurting in a week to 10 days, call Dr. Mort Sawyers office to have him recheck your ankle.  If the ankle wrap is rubbing your skin stop wearing it.  Keep the abrasion on your forehead clean and dry but you can use triple antibiotic ointment on it to help prevent infection.  Return to the emergency room for any problems listed on the head injury sheet.  When the day person comes to stay with you, have them call advanced home health to see what is taking the home health nurse so long to come visit you after your discharge from the hospital.

## 2019-11-16 NOTE — ED Provider Notes (Signed)
[facility]ERGENCY DEPARTMENT Provider Note   CSN: [id number] Arrival date & time: 11/16/19  [id number]   Time seen 3:25 AM  History Chief Complaint  Patient presents with  . Fall    Julie LunaMary H Burns is a 86 y.o. female.  HPI   Patient states she sleeps in a hospital bed and it has a rail however when she is in bed she cannot get the rail up.  She states tonight while sleeping she has fallen out of bed twice.  She states she has done that before a few times.  She states both type she hit her head.  She also complains of pain in her neck and in her left chest and she feels like she twisted her right ankle.  Patient is on Eliquis.  She states her last tetanus was last year.  During the course of exam patient was noted to have swelling of her legs.  She states they have been swelling and weeping for couple of months.  She has been in the hospital at least 6 weeks for the same.  She states she had cellulitis.  She states the legs are about the same and not improving.  Patient wears a medic alert button and that is how she got help.  PCP Kirstie PeriShah, Ashish, MD   Past Medical History:  Diagnosis Date  . Age-related osteoporosis without current pathological fracture   . Anxiety disorder   . Atrial fibrillation (HCC)   . CAD (coronary artery disease)    12/2013-DES to RCA due to NSTEMI- staged PCI to LAD and CX (not ideal for CABG; Echocardiogram 01/16/10 EF 55%, severe LVH; Moderate AS; mild ot moderate TR  . Chronic kidney disease   . Chronic respiratory failure with hypoxia (HCC)   . COPD (chronic obstructive pulmonary disease) (HCC)   . Degenerative myopia with macular hole   . Diabetes (HCC)    TYPE 2  . Hyperlipidemia   . Hypothyroidism   . Irritable bowel syndrome without diarrhea   . NSTEMI (non-ST elevated myocardial infarction) (HCC)    NSTEMI 3/05; s/p PTCA with stenting of left circumflex coronary artery 3/05; totally occluded small vessel OM  . Occlusion and stenosis of right  carotid artery    Carotid Duplex 7/13 moderate disease (<70%) of the right internal carotid arteries, this is a high bifurcation, s/p left CEA without significant stenosis.  . Polyp of colon   . Presence of prosthetic heart valve    S/P aortic valve replacement 04/29/2008   . PVD (peripheral vascular disease) (HCC)   . Sicca syndrome (HCC)   . Stroke (cerebrum) (HCC)   . Unspecified abdominal hernia without obstruction or gangrene   . Vitamin D deficiency     Patient Active Problem List   Diagnosis Date Noted  . Acute on chronic diastolic CHF (congestive heart failure) (HCC) 06/03/2019  . Nausea without vomiting 04/08/2019  . Constipation 04/08/2019  . S/P AVR 11/13/2017  . Dyslipidemia 11/13/2017  . Bilateral carotid artery stenosis 11/13/2017  . Palliative care by specialist   . Goals of care, counseling/discussion   . Encounter for hospice care discussion   . Pleural effusion, right   . COPD with acute exacerbation (HCC) 05/18/2017  . Acute diastolic CHF (congestive heart failure) (HCC) 05/17/2017  . Chronic respiratory failure with hypoxia (HCC) 05/16/2017  . Acute CHF (congestive heart failure) (HCC) 05/16/2017  . Essential hypertension 05/16/2017  . CHF (congestive heart failure) (HCC) 05/15/2017  . COPD (chronic obstructive pulmonary  disease) (HCC) 05/15/2017  . Acute on chronic respiratory failure with hypoxia (HCC) 05/15/2017  . Pleural effusion on right 05/15/2017  . SOB (shortness of breath)   . Coronary artery disease involving native coronary artery of native heart without angina pectoris 01/31/2017  . Stenosis of carotid artery 01/31/2017  . Obstructive chronic bronchitis with exacerbation (HCC) 06/15/2012  . Pain in joint, shoulder region 05/27/2012  . CAD (coronary artery disease) of artery bypass graft 05/27/2012  . COPD exacerbation (HCC) 05/27/2012  . Aortic valve disorders 05/27/2012  . A-fib (HCC) 05/27/2012    Past Surgical History:  Procedure  Laterality Date  . ABDOMINAL HERNIA REPAIR     per patient x 3  . AORTIC VALVE REPLACEMENT     S/P aortic valve replacement 04/29/2008   . CARDIAC CATHETERIZATION     by Flonnie Hailstone at [facility]; mLAD 30%, patent LCx stent, mRCA 50%, LVEDP 10, severe AS with AV mean gradient 46 mmHg, Right heart catheterization   . CARDIOVERSION N/A 06/08/2019   Procedure: CARDIOVERSION;  Surgeon: Antoine Poche, MD;  Location: [facility];  Service: Endoscopy;  Laterality: N/A;  . CAROTID ENDARTERECTOMY     Carotid Duplex 7/13 moderate disease (<70%) of the right internal carotid arteries, this is a high bifurcation, s/p left CEA without significant stenosis.  . COLONOSCOPY     per patient- several in the past in [facility], couple of polyps, last TCS between 5-10 years ago.   [name] MASTECTOMY Left 2013   breast cancer     OB History   No obstetric history on file.     Family History  Problem Relation Age of Onset  . Hypertension Mother   . CAD Mother   . Hypertension Father   . CAD Father   . Hypertension Sister   . CAD Sister   . Hypertension Brother   . CAD Brother   . Diabetes Brother   . Hypertension Brother   . Diabetes Brother   . COPD Son     Social History   Tobacco Use  . Smoking status: Former Smoker    Packs/day: 3.00    Types: Cigarettes    Quit date: 10/23/1976    Years since quitting: 43.0  . Smokeless tobacco: Never Used  Vaping Use  . Vaping Use: Never used  Substance Use Topics  . Alcohol use: No  . Drug use: No  Lives at home Lives alone  Home Medications Prior to Admission medications   Medication Sig Start Date End Date Taking? Authorizing Provider  alendronate (FOSAMAX) 70 MG tablet Take 70 mg by mouth once a week. Take with a full glass of water on an empty stomach. Take on Sunday's.    [provider]  apixaban (ELIQUIS) 5 MG TABS tablet Take 5 mg by mouth 2 (two) times daily.    [provider]  benzonatate (TESSALON) 200 MG capsule Take 200  mg by mouth 3 (three) times daily as needed for cough.    [provider]  budesonide (PULMICORT) 0.5 MG/2ML nebulizer solution Take 2 mLs (0.5 mg total) by nebulization 2 (two) times daily. 12/15/18   Mechele Claude, MD  Cholecalciferol (VITAMIN D3) 5000 units CAPS Take 1 capsule by mouth daily.    [provider]  diltiazem (CARDIZEM CD) 240 MG 24 hr capsule Take 1 capsule (240 mg total) by mouth daily. 08/17/19 11/15/19  Netta Neat., NP  Ipratropium-Albuterol (COMBIVENT RESPIMAT) 20-100 MCG/ACT AERS respimat Inhale 1 puff into  the lungs every 4 (four) hours as needed for wheezing or shortness of breath.  05/10/17   [provider]  ipratropium-albuterol (DUONEB) 0.5-2.5 (3) MG/3ML SOLN Take 3 mLs by nebulization every 6 (six) hours as needed (shortness of breath).     [provider]  isosorbide mononitrate (IMDUR) 60 MG 24 hr tablet Take 1 tablet (60 mg total) by mouth daily. 06/11/19 09/09/19  Shon Hale, MD  levothyroxine (SYNTHROID, LEVOTHROID) 75 MCG tablet Take 75 mcg by mouth See admin instructions. Take 75 mcg every Sun, Tues, Wed, Fri, and Sat.  Take 112.5 mcg every Mon and Thurs.    [provider]  LORazepam (ATIVAN) 0.5 MG tablet Take 0.5 mg by mouth 2 (two) times daily as needed for anxiety or sleep.     [provider]  metoprolol succinate (TOPROL-XL) 100 MG 24 hr tablet Take 1 tablet (100 mg total) by mouth in the morning and at bedtime. Take with or immediately following a meal. 08/17/19 11/15/19  Netta Neat., NP  Multiple Vitamins-Minerals (ICAPS AREDS FORMULA PO) Take 1 capsule by mouth 2 (two) times daily.     [provider]  nitroGLYCERIN (NITROSTAT) 0.4 MG SL tablet PLACE 1 TABLET UNDER THE TONGUE AT ONSET OF CHEST PAIN EVERY 5 MINTUES UP TO 3 TIMES AS NEEDED Patient taking differently: Place 0.4 mg under the tongue every 5 (five) minutes as needed.  11/20/18   Rollene Rotunda, MD  nystatin-triamcinolone  ointment [facility]) Apply 1 application topically 2 (two) times daily as needed (rash).     [provider]  ondansetron (ZOFRAN-ODT) 4 MG disintegrating tablet Take 1 tablet (4 mg total) by mouth every 8 (eight) hours as needed for nausea or vomiting. 08/19/19   Tiffany Kocher, PA-C  pantoprazole (PROTONIX) 40 MG tablet Take 1 tablet (40 mg total) by mouth daily. 06/11/19   Shon Hale, MD  polyethylene glycol (MIRALAX / GLYCOLAX) packet Take 17 g by mouth daily.    [provider]  Tiotropium Bromide-Olodaterol (STIOLTO RESPIMAT) 2.5-2.5 MCG/ACT AERS Inhale 2 puffs into the lungs daily. 05/20/19   Steffanie Dunn, DO  torsemide (DEMADEX) 20 MG tablet Take 2 tablets (40 mg total) by mouth daily. 06/12/19   Shon Hale, MD  traMADol (ULTRAM) 50 MG tablet Take 50 mg by mouth every 6 (six) hours as needed for moderate pain.     [provider]    Allergies    Statins, Aleve [naproxen sodium], Bee venom, Codeine, Ibuprofen, Morphine and related, Norvasc [amlodipine besylate], Prednisone, Sulfa antibiotics, Tylenol [acetaminophen], Valium [diazepam], Zetia [ezetimibe], and Penicillins  Review of Systems   Review of Systems  All other systems reviewed and are negative.   Physical Exam Updated Vital Signs BP (!) 111/92 (BP Location: Right Arm)   Pulse (!) 114   Temp 98 F (36.7 C) (Oral)   Resp 20   Ht 5' 6.5" (1.689 m)   Wt 91 kg   SpO2 96%   BMI 31.90 kg/m   Physical Exam Vitals and nursing note reviewed.  Constitutional:      General: She is not in acute distress.    Appearance: Normal appearance. She is normal weight.  HENT:     Head: Normocephalic.     Comments: Patient has a contusion/abrasion over her right forehead that is not actively bleeding.    Right Ear: External ear normal.     Left Ear: External ear normal.     Nose: Nose normal.  Eyes:     Extraocular Movements: Extraocular movements intact.     Conjunctiva/sclera: Conjunctivae  normal.     Pupils: Pupils are equal, round, and reactive to light.  Neck:     Comments: EMS has placed the patient in a [place] collar Cardiovascular:     Rate and Rhythm: Tachycardia present. Rhythm irregular.  Pulmonary:     Effort: Pulmonary effort is normal. No respiratory distress.     Breath sounds: Normal breath sounds.  Chest:     Chest wall: Tenderness present.    Musculoskeletal:     Right lower leg: Edema present.     Left lower leg: Edema present.     Comments: Patient's feet up to her ankles are distinctly reddish/purplish.  Her feet are cool to touch not hot.  I feel very faint pedal pulses in both.  She has mild diffuse swelling of both lower extremities and some very superficial epidermal ulcers.  She has bruising of both knees.  She states she is having increased pain in her right ankle.  Skin:    General: Skin is warm and dry.  Neurological:     General: No focal deficit present.     Mental Status: She is alert and oriented to person, place, and time.     Cranial Nerves: No cranial nerve deficit.  Psychiatric:        Mood and Affect: Mood normal.        Behavior: Behavior normal.        Thought Content: Thought content normal.         ED Results / Procedures / Treatments   Labs (all labs ordered are listed, but only abnormal results are displayed) Labs Reviewed - No data to display  EKG None  Radiology DG Ribs Unilateral W/Chest Left  Result Date: 11/16/2019 CLINICAL DATA:  Larey Seat out of bed. Left chest pain. EXAM: LEFT RIBS AND CHEST - 3+ VIEW COMPARISON:  Chest x-ray 09/22/2019 FINDINGS: The heart is enlarged but stable. Stable prominent mediastinal and hilar contours. Stable bronchitic type interstitial changes and streaky basilar scarring. No definite infiltrates or effusions. No pneumothorax. Dedicated views of the left ribs do not demonstrate any definite acute rib fractures. IMPRESSION: Chronic lung changes.  No acute pulmonary findings. No  definite acute left-sided rib fractures. Electronically Signed   By: Rudie Meyer M.D.   On: 11/16/2019 05:31   DG Ankle Complete Right  Result Date: 11/16/2019 CLINICAL DATA:  Larey Seat. Ankle pain. EXAM: RIGHT ANKLE - COMPLETE 3+ VIEW COMPARISON:  None. FINDINGS: The ankle mortise is maintained. Mild degenerative changes but no acute ankle fracture. The subtalar joints are maintained. No hindfoot fractures. Calcaneal spurring changes noted. IMPRESSION: Mild degenerative changes but no acute fracture. Electronically Signed   By: Rudie Meyer M.D.   On: 11/16/2019 05:32   CT Head Wo Contrast  CT Cervical Spine Wo Contrast  CT Maxillofacial WO CM  Result Date: 11/16/2019 CLINICAL DATA:  Facial trauma EXAM: CT HEAD WITHOUT CONTRAST CT MAXILLOFACIAL WITHOUT CONTRAST CT CERVICAL SPINE WITHOUT CONTRAST TECHNIQUE: Multidetector CT imaging of the head, cervical spine, and maxillofacial structures were performed using the standard protocol without intravenous contrast. Multiplanar CT image reconstructions of the cervical spine and maxillofacial structures were also generated. COMPARISON:  10/30/2019 head CT FINDINGS: CT HEAD FINDINGS Brain: No evidence of acute infarction, hemorrhage, hydrocephalus, extra-axial collection or mass lesion/mass effect. Patchy small remote left inferior cerebellar infarcts. Chronic small vessel ischemia that is confluent in the white matter.  Chronic lacune at the medial left thalamus and left corona radiata. Vascular: Atherosclerotic calcification Skull: Negative for fracture CT MAXILLOFACIAL FINDINGS Osseous: Negative for fracture or mandibular dislocation. Edentulous with alveolar ridge atrophy. Orbits: Bilateral cataract resection. Sinuses: Clear.  No hemosinus. Soft tissues: No discrete hematoma or opaque foreign body. CT CERVICAL SPINE FINDINGS Alignment: No traumatic malalignment Skull base and vertebrae: No acute fracture Soft tissues and spinal canal: No prevertebral fluid or  swelling. No visible canal hematoma. Disc levels: Facet osteoarthritis with bulky spurring asymmetric to the left. Upper chest: Negative IMPRESSION: No evidence of intracranial injury. Negative for facial or cervical spine fracture. Electronically Signed   By: Marnee Spring M.D.   On: 11/16/2019 04:32    Procedures Procedures (including critical care time)  Medications Ordered in ED Medications - No data to display  ED Course  I have reviewed the triage vital signs and the nursing notes.  Pertinent labs & imaging results that were available during my care of the patient were reviewed by me and considered in my medical decision making (see chart for details).    MDM Rules/Calculators/A&P                           Imaging studies were obtained of the areas that she states are painful.  5:40 AM I relayed the results of her imaging studies to the patient.  She states she was discharged from the hospital on September 9 and she was supposed to have a home health nurse to come out to dress her legs however she has not heard anything.  She states she has a phone number at home but she cannot see to dial the number.  She states that she only qualified for help during the day only, not at nighttime.  Patient is on home oxygen at 3-1/2 L/min.  Review of her chart shows she was admitted from August 26 through September 7 at [facility] for COPD exacerbation and cellulitis of her legs which ended up being Pseudomonas and she was kept in the hospital to finish a course of IV cefepime for that infection.  She is supposed to have a follow-up appointment for primary care doctor on the 15th.  Final Clinical Impression(s) / ED Diagnoses Final diagnoses:  Fall from bed, initial encounter  Abrasion of forehead, initial encounter  Contusion of chest, left, initial encounter  Pain and swelling of ankle, right    Rx / DC Orders ED Discharge Orders    None     Plan discharge  Devoria Albe, MD, Concha Pyo, MD 11/16/19 [phone]

## 2019-11-16 NOTE — ED Triage Notes (Signed)
[facility] - pt fell x 2 tonight. Pt has a laceration on her forehead, bleeding controlled. Pt also c/o neck pain, C-Collar in place.

## 2019-11-17 ENCOUNTER — Other Ambulatory Visit: Payer: Self-pay

## 2019-11-17 ENCOUNTER — Emergency Department (HOSPITAL_COMMUNITY): Payer: Medicare Other

## 2019-11-17 ENCOUNTER — Inpatient Hospital Stay (HOSPITAL_COMMUNITY)
Admission: EM | Admit: 2019-11-17 | Discharge: 2019-12-04 | DRG: 871 | Disposition: E | Payer: Medicare Other | Attending: Family Medicine | Admitting: Family Medicine

## 2019-11-17 ENCOUNTER — Encounter (HOSPITAL_COMMUNITY): Payer: Self-pay | Admitting: Emergency Medicine

## 2019-11-17 DIAGNOSIS — Z66 Do not resuscitate: Secondary | ICD-10-CM | POA: Diagnosis present

## 2019-11-17 DIAGNOSIS — J9621 Acute and chronic respiratory failure with hypoxia: Secondary | ICD-10-CM | POA: Diagnosis present

## 2019-11-17 DIAGNOSIS — Z833 Family history of diabetes mellitus: Secondary | ICD-10-CM

## 2019-11-17 DIAGNOSIS — I471 Supraventricular tachycardia: Secondary | ICD-10-CM | POA: Diagnosis present

## 2019-11-17 DIAGNOSIS — N183 Chronic kidney disease, stage 3 unspecified: Secondary | ICD-10-CM | POA: Diagnosis present

## 2019-11-17 DIAGNOSIS — I5032 Chronic diastolic (congestive) heart failure: Secondary | ICD-10-CM | POA: Diagnosis present

## 2019-11-17 DIAGNOSIS — I6521 Occlusion and stenosis of right carotid artery: Secondary | ICD-10-CM | POA: Diagnosis present

## 2019-11-17 DIAGNOSIS — Z7984 Long term (current) use of oral hypoglycemic drugs: Secondary | ICD-10-CM

## 2019-11-17 DIAGNOSIS — I444 Left anterior fascicular block: Secondary | ICD-10-CM | POA: Diagnosis present

## 2019-11-17 DIAGNOSIS — F419 Anxiety disorder, unspecified: Secondary | ICD-10-CM | POA: Diagnosis present

## 2019-11-17 DIAGNOSIS — A419 Sepsis, unspecified organism: Principal | ICD-10-CM

## 2019-11-17 DIAGNOSIS — Z951 Presence of aortocoronary bypass graft: Secondary | ICD-10-CM

## 2019-11-17 DIAGNOSIS — I25810 Atherosclerosis of coronary artery bypass graft(s) without angina pectoris: Secondary | ICD-10-CM | POA: Diagnosis present

## 2019-11-17 DIAGNOSIS — R5383 Other fatigue: Secondary | ICD-10-CM

## 2019-11-17 DIAGNOSIS — I2510 Atherosclerotic heart disease of native coronary artery without angina pectoris: Secondary | ICD-10-CM | POA: Diagnosis present

## 2019-11-17 DIAGNOSIS — I469 Cardiac arrest, cause unspecified: Secondary | ICD-10-CM | POA: Diagnosis present

## 2019-11-17 DIAGNOSIS — Z853 Personal history of malignant neoplasm of breast: Secondary | ICD-10-CM

## 2019-11-17 DIAGNOSIS — J90 Pleural effusion, not elsewhere classified: Secondary | ICD-10-CM | POA: Diagnosis present

## 2019-11-17 DIAGNOSIS — I252 Old myocardial infarction: Secondary | ICD-10-CM

## 2019-11-17 DIAGNOSIS — S8002XA Contusion of left knee, initial encounter: Secondary | ICD-10-CM | POA: Diagnosis present

## 2019-11-17 DIAGNOSIS — S0083XA Contusion of other part of head, initial encounter: Secondary | ICD-10-CM | POA: Diagnosis present

## 2019-11-17 DIAGNOSIS — Z9012 Acquired absence of left breast and nipple: Secondary | ICD-10-CM

## 2019-11-17 DIAGNOSIS — N289 Disorder of kidney and ureter, unspecified: Secondary | ICD-10-CM

## 2019-11-17 DIAGNOSIS — J9622 Acute and chronic respiratory failure with hypercapnia: Secondary | ICD-10-CM | POA: Diagnosis present

## 2019-11-17 DIAGNOSIS — I4430 Unspecified atrioventricular block: Secondary | ICD-10-CM | POA: Diagnosis present

## 2019-11-17 DIAGNOSIS — Z91030 Bee allergy status: Secondary | ICD-10-CM

## 2019-11-17 DIAGNOSIS — Z7951 Long term (current) use of inhaled steroids: Secondary | ICD-10-CM

## 2019-11-17 DIAGNOSIS — Z955 Presence of coronary angioplasty implant and graft: Secondary | ICD-10-CM

## 2019-11-17 DIAGNOSIS — R230 Cyanosis: Secondary | ICD-10-CM | POA: Diagnosis present

## 2019-11-17 DIAGNOSIS — M25471 Effusion, right ankle: Secondary | ICD-10-CM | POA: Diagnosis present

## 2019-11-17 DIAGNOSIS — E871 Hypo-osmolality and hyponatremia: Secondary | ICD-10-CM | POA: Diagnosis present

## 2019-11-17 DIAGNOSIS — Z9981 Dependence on supplemental oxygen: Secondary | ICD-10-CM

## 2019-11-17 DIAGNOSIS — M542 Cervicalgia: Secondary | ICD-10-CM

## 2019-11-17 DIAGNOSIS — Z880 Allergy status to penicillin: Secondary | ICD-10-CM

## 2019-11-17 DIAGNOSIS — D72829 Elevated white blood cell count, unspecified: Secondary | ICD-10-CM | POA: Diagnosis present

## 2019-11-17 DIAGNOSIS — M810 Age-related osteoporosis without current pathological fracture: Secondary | ICD-10-CM | POA: Diagnosis present

## 2019-11-17 DIAGNOSIS — J9602 Acute respiratory failure with hypercapnia: Secondary | ICD-10-CM | POA: Diagnosis present

## 2019-11-17 DIAGNOSIS — S8001XA Contusion of right knee, initial encounter: Secondary | ICD-10-CM | POA: Diagnosis present

## 2019-11-17 DIAGNOSIS — G9341 Metabolic encephalopathy: Secondary | ICD-10-CM | POA: Diagnosis present

## 2019-11-17 DIAGNOSIS — I4892 Unspecified atrial flutter: Secondary | ICD-10-CM | POA: Diagnosis present

## 2019-11-17 DIAGNOSIS — Z888 Allergy status to other drugs, medicaments and biological substances status: Secondary | ICD-10-CM

## 2019-11-17 DIAGNOSIS — R296 Repeated falls: Secondary | ICD-10-CM

## 2019-11-17 DIAGNOSIS — N179 Acute kidney failure, unspecified: Secondary | ICD-10-CM | POA: Diagnosis present

## 2019-11-17 DIAGNOSIS — R531 Weakness: Secondary | ICD-10-CM

## 2019-11-17 DIAGNOSIS — J9811 Atelectasis: Secondary | ICD-10-CM | POA: Diagnosis present

## 2019-11-17 DIAGNOSIS — E1136 Type 2 diabetes mellitus with diabetic cataract: Secondary | ICD-10-CM | POA: Diagnosis present

## 2019-11-17 DIAGNOSIS — Z952 Presence of prosthetic heart valve: Secondary | ICD-10-CM

## 2019-11-17 DIAGNOSIS — M7989 Other specified soft tissue disorders: Secondary | ICD-10-CM | POA: Diagnosis present

## 2019-11-17 DIAGNOSIS — Z882 Allergy status to sulfonamides status: Secondary | ICD-10-CM

## 2019-11-17 DIAGNOSIS — N1832 Chronic kidney disease, stage 3b: Secondary | ICD-10-CM | POA: Diagnosis present

## 2019-11-17 DIAGNOSIS — E872 Acidosis: Secondary | ICD-10-CM | POA: Diagnosis present

## 2019-11-17 DIAGNOSIS — E860 Dehydration: Secondary | ICD-10-CM | POA: Diagnosis present

## 2019-11-17 DIAGNOSIS — Z7901 Long term (current) use of anticoagulants: Secondary | ICD-10-CM

## 2019-11-17 DIAGNOSIS — Z515 Encounter for palliative care: Secondary | ICD-10-CM

## 2019-11-17 DIAGNOSIS — W06XXXA Fall from bed, initial encounter: Secondary | ICD-10-CM | POA: Diagnosis present

## 2019-11-17 DIAGNOSIS — E039 Hypothyroidism, unspecified: Secondary | ICD-10-CM | POA: Diagnosis present

## 2019-11-17 DIAGNOSIS — S20219A Contusion of unspecified front wall of thorax, initial encounter: Secondary | ICD-10-CM | POA: Diagnosis present

## 2019-11-17 DIAGNOSIS — Z79899 Other long term (current) drug therapy: Secondary | ICD-10-CM

## 2019-11-17 DIAGNOSIS — Z825 Family history of asthma and other chronic lower respiratory diseases: Secondary | ICD-10-CM

## 2019-11-17 DIAGNOSIS — Z87891 Personal history of nicotine dependence: Secondary | ICD-10-CM

## 2019-11-17 DIAGNOSIS — J449 Chronic obstructive pulmonary disease, unspecified: Secondary | ICD-10-CM | POA: Diagnosis present

## 2019-11-17 DIAGNOSIS — Z86010 Personal history of colonic polyps: Secondary | ICD-10-CM

## 2019-11-17 DIAGNOSIS — K219 Gastro-esophageal reflux disease without esophagitis: Secondary | ICD-10-CM | POA: Diagnosis present

## 2019-11-17 DIAGNOSIS — E1122 Type 2 diabetes mellitus with diabetic chronic kidney disease: Secondary | ICD-10-CM | POA: Diagnosis present

## 2019-11-17 DIAGNOSIS — S0081XA Abrasion of other part of head, initial encounter: Secondary | ICD-10-CM | POA: Diagnosis present

## 2019-11-17 DIAGNOSIS — R6521 Severe sepsis with septic shock: Secondary | ICD-10-CM | POA: Diagnosis present

## 2019-11-17 DIAGNOSIS — Z20822 Contact with and (suspected) exposure to covid-19: Secondary | ICD-10-CM | POA: Diagnosis present

## 2019-11-17 DIAGNOSIS — E785 Hyperlipidemia, unspecified: Secondary | ICD-10-CM | POA: Diagnosis present

## 2019-11-17 DIAGNOSIS — M3500 Sicca syndrome, unspecified: Secondary | ICD-10-CM | POA: Diagnosis present

## 2019-11-17 DIAGNOSIS — Z8249 Family history of ischemic heart disease and other diseases of the circulatory system: Secondary | ICD-10-CM

## 2019-11-17 DIAGNOSIS — Z9049 Acquired absence of other specified parts of digestive tract: Secondary | ICD-10-CM

## 2019-11-17 DIAGNOSIS — Z8673 Personal history of transient ischemic attack (TIA), and cerebral infarction without residual deficits: Secondary | ICD-10-CM

## 2019-11-17 DIAGNOSIS — I130 Hypertensive heart and chronic kidney disease with heart failure and stage 1 through stage 4 chronic kidney disease, or unspecified chronic kidney disease: Secondary | ICD-10-CM | POA: Diagnosis present

## 2019-11-17 DIAGNOSIS — I4891 Unspecified atrial fibrillation: Secondary | ICD-10-CM | POA: Diagnosis present

## 2019-11-17 DIAGNOSIS — N3000 Acute cystitis without hematuria: Secondary | ICD-10-CM

## 2019-11-17 DIAGNOSIS — J189 Pneumonia, unspecified organism: Secondary | ICD-10-CM | POA: Diagnosis present

## 2019-11-17 DIAGNOSIS — R0600 Dyspnea, unspecified: Secondary | ICD-10-CM

## 2019-11-17 DIAGNOSIS — Z885 Allergy status to narcotic agent status: Secondary | ICD-10-CM

## 2019-11-17 LAB — BLOOD GAS, VENOUS
Acid-base deficit: 0 mmol/L (ref 0.0–2.0)
Bicarbonate: 24.6 mmol/L (ref 20.0–28.0)
FIO2: 21
O2 Saturation: 98.6 %
Patient temperature: 37
pCO2, Ven: 37.7 mmHg — ABNORMAL LOW (ref 44.0–60.0)
pH, Ven: 7.419 (ref 7.250–7.430)
pO2, Ven: 130 mmHg — ABNORMAL HIGH (ref 32.0–45.0)

## 2019-11-17 LAB — CBC WITH DIFFERENTIAL/PLATELET
Abs Immature Granulocytes: 0.09 10*3/uL — ABNORMAL HIGH (ref 0.00–0.07)
Basophils Absolute: 0.1 10*3/uL (ref 0.0–0.1)
Basophils Relative: 0 %
Eosinophils Absolute: 0 10*3/uL (ref 0.0–0.5)
Eosinophils Relative: 0 %
HCT: 45 % (ref 36.0–46.0)
Hemoglobin: 13.8 g/dL (ref 12.0–15.0)
Immature Granulocytes: 1 %
Lymphocytes Relative: 4 %
Lymphs Abs: 0.6 10*3/uL — ABNORMAL LOW (ref 0.7–4.0)
MCH: 27.8 pg (ref 26.0–34.0)
MCHC: 30.7 g/dL (ref 30.0–36.0)
MCV: 90.7 fL (ref 80.0–100.0)
Monocytes Absolute: 1.7 10*3/uL — ABNORMAL HIGH (ref 0.1–1.0)
Monocytes Relative: 11 %
Neutro Abs: 12.7 10*3/uL — ABNORMAL HIGH (ref 1.7–7.7)
Neutrophils Relative %: 84 %
Platelets: 184 10*3/uL (ref 150–400)
RBC: 4.96 MIL/uL (ref 3.87–5.11)
RDW: 17.2 % — ABNORMAL HIGH (ref 11.5–15.5)
WBC: 15.3 10*3/uL — ABNORMAL HIGH (ref 4.0–10.5)
nRBC: 0 % (ref 0.0–0.2)

## 2019-11-17 LAB — COMPREHENSIVE METABOLIC PANEL
ALT: 15 U/L (ref 0–44)
AST: 21 U/L (ref 15–41)
Albumin: 3.3 g/dL — ABNORMAL LOW (ref 3.5–5.0)
Alkaline Phosphatase: 102 U/L (ref 38–126)
Anion gap: 14 (ref 5–15)
BUN: 36 mg/dL — ABNORMAL HIGH (ref 8–23)
CO2: 23 mmol/L (ref 22–32)
Calcium: 8.5 mg/dL — ABNORMAL LOW (ref 8.9–10.3)
Chloride: 95 mmol/L — ABNORMAL LOW (ref 98–111)
Creatinine, Ser: 1.38 mg/dL — ABNORMAL HIGH (ref 0.44–1.00)
GFR calc Af Amer: 40 mL/min — ABNORMAL LOW (ref >60)
GFR calc non Af Amer: 34 mL/min — ABNORMAL LOW (ref >60)
Glucose, Bld: 138 mg/dL — ABNORMAL HIGH (ref 70–99)
Potassium: 4.2 mmol/L (ref 3.5–5.1)
Sodium: 132 mmol/L — ABNORMAL LOW (ref 135–145)
Total Bilirubin: 1.7 mg/dL — ABNORMAL HIGH (ref 0.3–1.2)
Total Protein: 6.4 g/dL — ABNORMAL LOW (ref 6.5–8.1)

## 2019-11-17 LAB — URINALYSIS, ROUTINE W REFLEX MICROSCOPIC
Glucose, UA: NEGATIVE mg/dL
Hgb urine dipstick: NEGATIVE
Ketones, ur: 5 mg/dL — AB
Nitrite: NEGATIVE
Protein, ur: 100 mg/dL — AB
Specific Gravity, Urine: 1.02 (ref 1.005–1.030)
pH: 5 (ref 5.0–8.0)

## 2019-11-17 LAB — LIPASE, BLOOD: Lipase: 27 U/L (ref 11–51)

## 2019-11-17 LAB — LACTIC ACID, PLASMA: Lactic Acid, Venous: 1.9 mmol/L (ref 0.5–1.9)

## 2019-11-17 MED ORDER — SODIUM CHLORIDE 0.9 % IV BOLUS
500.0000 mL | Freq: Once | INTRAVENOUS | Status: AC
  Administered 2019-11-17: 500 mL via INTRAVENOUS

## 2019-11-17 NOTE — ED Notes (Signed)
Pt placed on purewick 

## 2019-11-17 NOTE — ED Triage Notes (Signed)
[facility] pt c/o left sided neck pain. Pt discharged from ED yesterday. Daughter called to say that pt is being sent to ED due to having no care giver at the home tonight.

## 2019-11-17 NOTE — ED Provider Notes (Signed)
[facility] EMERGENCY DEPARTMENT Provider Note   CSN: [id number] Arrival date & time: 11/28/2019  1849     History Chief Complaint  Patient presents with  . Neck Pain    Julie Burns is a 86 y.o. female with a history of type 2 diabetes, COPD on chronic home O2, chronic kidney disease, CAD, atrial fibrillation, history of non-STEMI who was seen here yesterday evening after falling out of her bed twice, causing injury to her head, neck left chest wall and right ankle.  She had imaging of these areas and there were no acute injuries found.  She returns tonight secondary to persistent pain in the left side of her neck which is worsened with movement.  She has found no alleviators for her symptoms. She also endorses increased drowsiness and fatigue along with increased urinary frequency.  She denies fevers or chills, no back pain.  She denies n/v, back or flank pain.     Patient lives in an apartment by herself but has a caregiver during the daytime.  She denies headache or confusion but has been very drowsy today.  She states she has not slept well for the past several nights. Nursing triage note indicates a family member called and stated she was sent back tonight because she had no one to stay with her and they were concerned about her staying along.  Per pt, this was a daughter in law who called from [place].   Pt endorses that she generally does not feel unsafe at home after her caregiver leaves for the day, but expresses doubt about her ability to care for herself with her current weakness.    The history is provided by the patient.       Past Medical History:  Diagnosis Date  . Age-related osteoporosis without current pathological fracture   . Anxiety disorder   . Atrial fibrillation (HCC)   . CAD (coronary artery disease)    12/2013-DES to RCA due to NSTEMI- staged PCI to LAD and CX (not ideal for CABG; Echocardiogram 01/16/10 EF 55%, severe LVH; Moderate AS; mild ot moderate TR  .  Chronic kidney disease   . Chronic respiratory failure with hypoxia (HCC)   . COPD (chronic obstructive pulmonary disease) (HCC)   . Degenerative myopia with macular hole   . Diabetes (HCC)    TYPE 2  . Hyperlipidemia   . Hypothyroidism   . Irritable bowel syndrome without diarrhea   . NSTEMI (non-ST elevated myocardial infarction) (HCC)    NSTEMI 3/05; s/p PTCA with stenting of left circumflex coronary artery 3/05; totally occluded small vessel OM  . Occlusion and stenosis of right carotid artery    Carotid Duplex 7/13 moderate disease (<70%) of the right internal carotid arteries, this is a high bifurcation, s/p left CEA without significant stenosis.  . Polyp of colon   . Presence of prosthetic heart valve    S/P aortic valve replacement 04/29/2008   . PVD (peripheral vascular disease) (HCC)   . Sicca syndrome (HCC)   . Stroke (cerebrum) (HCC)   . Unspecified abdominal hernia without obstruction or gangrene   . Vitamin D deficiency     Patient Active Problem List   Diagnosis Date Noted  . Acute on chronic diastolic CHF (congestive heart failure) (HCC) 06/03/2019  . Nausea without vomiting 04/08/2019  . Constipation 04/08/2019  . S/P AVR 11/13/2017  . Dyslipidemia 11/13/2017  . Bilateral carotid artery stenosis 11/13/2017  . Palliative care by specialist   .  Goals of care, counseling/discussion   . Encounter for hospice care discussion   . Pleural effusion, right   . COPD with acute exacerbation (HCC) 05/18/2017  . Acute diastolic CHF (congestive heart failure) (HCC) 05/17/2017  . Chronic respiratory failure with hypoxia (HCC) 05/16/2017  . Acute CHF (congestive heart failure) (HCC) 05/16/2017  . Essential hypertension 05/16/2017  . CHF (congestive heart failure) (HCC) 05/15/2017  . COPD (chronic obstructive pulmonary disease) (HCC) 05/15/2017  . Acute on chronic respiratory failure with hypoxia (HCC) 05/15/2017  . Pleural effusion on right 05/15/2017  . SOB (shortness of  breath)   . Coronary artery disease involving native coronary artery of native heart without angina pectoris 01/31/2017  . Stenosis of carotid artery 01/31/2017  . Obstructive chronic bronchitis with exacerbation (HCC) 06/15/2012  . Pain in joint, shoulder region 05/27/2012  . CAD (coronary artery disease) of artery bypass graft 05/27/2012  . COPD exacerbation (HCC) 05/27/2012  . Aortic valve disorders 05/27/2012  . A-fib (HCC) 05/27/2012    Past Surgical History:  Procedure Laterality Date  . ABDOMINAL HERNIA REPAIR     per patient x 3  . AORTIC VALVE REPLACEMENT     S/P aortic valve replacement 04/29/2008   . CARDIAC CATHETERIZATION     by Flonnie Hailstone at [facility]; mLAD 30%, patent LCx stent, mRCA 50%, LVEDP 10, severe AS with AV mean gradient 46 mmHg, Right heart catheterization   . CARDIOVERSION N/A 06/08/2019   Procedure: CARDIOVERSION;  Surgeon: Antoine Poche, MD;  Location: [facility];  Service: Endoscopy;  Laterality: N/A;  . CAROTID ENDARTERECTOMY     Carotid Duplex 7/13 moderate disease (<70%) of the right internal carotid arteries, this is a high bifurcation, s/p left CEA without significant stenosis.  . COLONOSCOPY     per patient- several in the past in [facility], couple of polyps, last TCS between 5-10 years ago.   [name] MASTECTOMY Left 2013   breast cancer     OB History   No obstetric history on file.     Family History  Problem Relation Age of Onset  . Hypertension Mother   . CAD Mother   . Hypertension Father   . CAD Father   . Hypertension Sister   . CAD Sister   . Hypertension Brother   . CAD Brother   . Diabetes Brother   . Hypertension Brother   . Diabetes Brother   . COPD Son     Social History   Tobacco Use  . Smoking status: Former Smoker    Packs/day: 3.00    Types: Cigarettes    Quit date: 10/23/1976    Years since quitting: 43.0  . Smokeless tobacco: Never Used  Vaping Use  . Vaping Use: Never used  Substance Use Topics  . Alcohol use:  No  . Drug use: No    Home Medications Prior to Admission medications   Medication Sig Start Date End Date Taking? Authorizing Provider  alendronate (FOSAMAX) 70 MG tablet Take 70 mg by mouth once a week. Take with a full glass of water on an empty stomach. Take on Sunday's.    [provider]  apixaban (ELIQUIS) 5 MG TABS tablet Take 5 mg by mouth 2 (two) times daily.    [provider]  benzonatate (TESSALON) 200 MG capsule Take 200 mg by mouth 3 (three) times daily as needed for cough.    [provider]  budesonide (PULMICORT) 0.5 MG/2ML nebulizer solution Take 2 mLs (0.5 mg  total) by nebulization 2 (two) times daily. 12/15/18   Mechele Claude, MD  Cholecalciferol (VITAMIN D3) 5000 units CAPS Take 1 capsule by mouth daily.    [provider]  diltiazem (CARDIZEM CD) 240 MG 24 hr capsule Take 1 capsule (240 mg total) by mouth daily. 08/17/19 11/15/19  Netta Neat., NP  Ipratropium-Albuterol (COMBIVENT RESPIMAT) 20-100 MCG/ACT AERS respimat Inhale 1 puff into the lungs every 4 (four) hours as needed for wheezing or shortness of breath.  05/10/17   [provider]  ipratropium-albuterol (DUONEB) 0.5-2.5 (3) MG/3ML SOLN Take 3 mLs by nebulization every 6 (six) hours as needed (shortness of breath).     [provider]  isosorbide mononitrate (IMDUR) 60 MG 24 hr tablet Take 1 tablet (60 mg total) by mouth daily. 06/11/19 09/09/19  Shon Hale, MD  levothyroxine (SYNTHROID, LEVOTHROID) 75 MCG tablet Take 75 mcg by mouth See admin instructions. Take 75 mcg every Sun, Tues, Wed, Fri, and Sat.  Take 112.5 mcg every Mon and Thurs.    [provider]  LORazepam (ATIVAN) 0.5 MG tablet Take 0.5 mg by mouth 2 (two) times daily as needed for anxiety or sleep.     [provider]  metoprolol succinate (TOPROL-XL) 100 MG 24 hr tablet Take 1 tablet (100 mg total) by mouth in the morning and at bedtime. Take with or immediately following  a meal. 08/17/19 11/15/19  Netta Neat., NP  Multiple Vitamins-Minerals (ICAPS AREDS FORMULA PO) Take 1 capsule by mouth 2 (two) times daily.     [provider]  nitroGLYCERIN (NITROSTAT) 0.4 MG SL tablet PLACE 1 TABLET UNDER THE TONGUE AT ONSET OF CHEST PAIN EVERY 5 MINTUES UP TO 3 TIMES AS NEEDED Patient taking differently: Place 0.4 mg under the tongue every 5 (five) minutes as needed.  11/20/18   Rollene Rotunda, MD  nystatin-triamcinolone ointment [facility]) Apply 1 application topically 2 (two) times daily as needed (rash).     [provider]  ondansetron (ZOFRAN-ODT) 4 MG disintegrating tablet Take 1 tablet (4 mg total) by mouth every 8 (eight) hours as needed for nausea or vomiting. 08/19/19   Tiffany Kocher, PA-C  pantoprazole (PROTONIX) 40 MG tablet Take 1 tablet (40 mg total) by mouth daily. 06/11/19   Shon Hale, MD  polyethylene glycol (MIRALAX / GLYCOLAX) packet Take 17 g by mouth daily.    [provider]  Tiotropium Bromide-Olodaterol (STIOLTO RESPIMAT) 2.5-2.5 MCG/ACT AERS Inhale 2 puffs into the lungs daily. 05/20/19   Steffanie Dunn, DO  torsemide (DEMADEX) 20 MG tablet Take 2 tablets (40 mg total) by mouth daily. 06/12/19   Shon Hale, MD  traMADol (ULTRAM) 50 MG tablet Take 50 mg by mouth every 6 (six) hours as needed for moderate pain.     [provider]    Allergies    Statins, Aleve [naproxen sodium], Bee venom, Codeine, Ibuprofen, Morphine and related, Norvasc [amlodipine besylate], Prednisone, Sulfa antibiotics, Tylenol [acetaminophen], Valium [diazepam], Zetia [ezetimibe], and Penicillins  Review of Systems   Review of Systems  Constitutional: Positive for fatigue. Negative for fever.  HENT: Negative for congestion and sore throat.   Eyes: Negative.   Respiratory: Negative for chest tightness and shortness of breath.   Cardiovascular: Negative for chest pain.  Gastrointestinal: Negative for abdominal pain and nausea.    Genitourinary: Negative.   Musculoskeletal: Positive for neck pain. Negative for arthralgias and joint swelling.  Skin: Positive for color change. Negative for rash.  Multiple contusions from yesterdays fall  Neurological: Negative for dizziness, weakness, light-headedness, numbness and headaches.  Psychiatric/Behavioral: Positive for sleep disturbance.    Physical Exam Updated Vital Signs BP 94/77   Pulse 69   Temp 98.9 F (37.2 C) (Oral)   Resp 18   Ht 5' 6.5" (1.689 m)   Wt 91 kg   SpO2 92%   BMI 31.90 kg/m   Physical Exam Vitals and nursing note reviewed.  Constitutional:      Appearance: She is well-developed.     Comments: Patient appears very drowsy, drifts off to sleep midsentence.  HENT:     Head: Normocephalic. Contusion present.      Comments: Contusion of right forehead.  Tender to palpation along left SCM.  No midline cervical tenderness. Eyes:     Conjunctiva/sclera: Conjunctivae normal.  Cardiovascular:     Rate and Rhythm: Normal rate and regular rhythm.     Heart sounds: Normal heart sounds.  Pulmonary:     Effort: Pulmonary effort is normal.     Breath sounds: Normal breath sounds. No wheezing.  Abdominal:     General: Bowel sounds are normal.     Palpations: Abdomen is soft.     Tenderness: There is no abdominal tenderness. There is no guarding.  Musculoskeletal:        General: Normal range of motion.     Cervical back: Normal range of motion.  Skin:    General: Skin is warm and dry.     Comments: Hyperemic lower extremities, superficial abrasions and contusions similar to photo on yesterdays note. Dorsalis pedal pulses present.   Neurological:     General: No focal deficit present.     Mental Status: She is oriented to person, place, and time. She is lethargic.     Motor: Weakness present.     ED Results / Procedures / Treatments   Labs (all labs ordered are listed, but only abnormal results are displayed) Labs Reviewed  CBC WITH  DIFFERENTIAL/PLATELET - Abnormal; Notable for the following components:      Result Value   WBC 15.3 (*)    RDW 17.2 (*)    Neutro Abs 12.7 (*)    Lymphs Abs 0.6 (*)    Monocytes Absolute 1.7 (*)    Abs Immature Granulocytes 0.09 (*)    All other components within normal limits  COMPREHENSIVE METABOLIC PANEL - Abnormal; Notable for the following components:   Sodium 132 (*)    Chloride 95 (*)    Glucose, Bld 138 (*)    BUN 36 (*)    Creatinine, Ser 1.38 (*)    Calcium 8.5 (*)    Total Protein 6.4 (*)    Albumin 3.3 (*)    Total Bilirubin 1.7 (*)    GFR calc non Af Amer 34 (*)    GFR calc Af Amer 40 (*)    All other components within normal limits  URINALYSIS, ROUTINE W REFLEX MICROSCOPIC - Abnormal; Notable for the following components:   Color, Urine AMBER (*)    APPearance CLOUDY (*)    Bilirubin Urine SMALL (*)    Ketones, ur 5 (*)    Protein, ur 100 (*)    Leukocytes,Ua SMALL (*)    Bacteria, UA RARE (*)    Non Squamous Epithelial 6-10 (*)    All other components within normal limits  BLOOD GAS, VENOUS - Abnormal; Notable for the following components:   pCO2, Ven 37.7 (*)    pO2,  Ven 130.0 (*)    All other components within normal limits  CULTURE, BLOOD (ROUTINE X 2)  CULTURE, BLOOD (ROUTINE X 2)  URINE CULTURE  SARS CORONAVIRUS 2 BY RT PCR (HOSPITAL ORDER, PERFORMED IN [facility])  LACTIC ACID, PLASMA  LIPASE, BLOOD    EKG EKG Interpretation  Date/Time:  Tuesday 12/05/19 23:08:11 EDT Ventricular Rate:  110 PR Interval:    QRS Duration: 94 QT Interval:  347 QTC Calculation: 470 R Axis:   -60 Text Interpretation: Atrial flutter with predominant 3:1 AV block Left anterior fascicular block Abnormal R-wave progression, late transition LVH with secondary repolarization abnormality Confirmed by Marianna Fuss ([id number]) on 12-05-19 11:31:42 PM   Radiology Results for orders placed or performed during the hospital encounter of 05-Dec-2019    Blood culture (routine x 2)   Specimen: Blood  Result Value Ref Range   Specimen Description BLOOD RIGHT HAND    Special Requests      BOTTLES DRAWN AEROBIC AND ANAEROBIC Blood Culture adequate volume Performed at [facility], [address] [place], [place] [address]    Culture PENDING    Report Status PENDING   Blood culture (routine x 2)   Specimen: Blood  Result Value Ref Range   Specimen Description BLOOD RIGHT HAND    Special Requests      BOTTLES DRAWN AEROBIC AND ANAEROBIC Blood Culture adequate volume Performed at [facility], [address], [place], [place] [address]    Culture PENDING    Report Status PENDING   CBC with Differential/Platelet  Result Value Ref Range   WBC 15.3 (H) 4.0 - 10.5 K/uL   RBC 4.96 3.87 - 5.11 MIL/uL   Hemoglobin 13.8 12.0 - 15.0 g/dL   HCT 46.9 36 - 46 %   MCV 90.7 80.0 - 100.0 fL   MCH 27.8 26.0 - 34.0 pg   MCHC 30.7 30.0 - 36.0 g/dL   RDW 62.9 (H) 52.8 - 41.3 %   Platelets 184 150 - 400 K/uL   nRBC 0.0 0.0 - 0.2 %   Neutrophils Relative % 84 %   Neutro Abs 12.7 (H) 1.7 - 7.7 K/uL   Lymphocytes Relative 4 %   Lymphs Abs 0.6 (L) 0.7 - 4.0 K/uL   Monocytes Relative 11 %   Monocytes Absolute 1.7 (H) 0 - 1 K/uL   Eosinophils Relative 0 %   Eosinophils Absolute 0.0 0 - 0 K/uL   Basophils Relative 0 %   Basophils Absolute 0.1 0 - 0 K/uL   Immature Granulocytes 1 %   Abs Immature Granulocytes 0.09 (H) 0.00 - 0.07 K/uL  Lactic acid, plasma  Result Value Ref Range   Lactic Acid, Venous 1.9 0.5 - 1.9 mmol/L  Comprehensive metabolic panel  Result Value Ref Range   Sodium 132 (L) 135 - 145 mmol/L   Potassium 4.2 3.5 - 5.1 mmol/L   Chloride 95 (L) 98 - 111 mmol/L   CO2 23 22 - 32 mmol/L   Glucose, Bld 138 (H) 70 - 99 mg/dL   BUN 36 (H) 8 - 23 mg/dL   Creatinine, Ser 2.44 (H) 0.44 - 1.00 mg/dL   Calcium 8.5 (L) 8.9 - 10.3 mg/dL   Total Protein 6.4 (L) 6.5 - 8.1 g/dL   Albumin 3.3 (L) 3.5 - 5.0 g/dL   AST 21 15 - 41 U/L   ALT 15 0  - 44 U/L   Alkaline Phosphatase 102 38 - 126 U/L   Total  Bilirubin 1.7 (H) 0.3 - 1.2 mg/dL   GFR calc non Af Amer 34 (L) >60 mL/min   GFR calc Af Amer 40 (L) >60 mL/min   Anion gap 14 5 - 15  Lipase, blood  Result Value Ref Range   Lipase 27 11 - 51 U/L  Urinalysis, Routine w reflex microscopic Urine, Catheterized  Result Value Ref Range   Color, Urine AMBER (A) YELLOW   APPearance CLOUDY (A) CLEAR   Specific Gravity, Urine 1.020 1.005 - 1.030   pH 5.0 5.0 - 8.0   Glucose, UA NEGATIVE NEGATIVE mg/dL   Hgb urine dipstick NEGATIVE NEGATIVE   Bilirubin Urine SMALL (A) NEGATIVE   Ketones, ur 5 (A) NEGATIVE mg/dL   Protein, ur 403100 (A) NEGATIVE mg/dL   Nitrite NEGATIVE NEGATIVE   Leukocytes,Ua SMALL (A) NEGATIVE   RBC / HPF 0-5 0 - 5 RBC/hpf   WBC, UA 21-50 0 - 5 WBC/hpf   Bacteria, UA RARE (A) NONE SEEN   Squamous Epithelial / LPF 6-10 0 - 5   WBC Clumps PRESENT    Hyaline Casts, UA PRESENT    Non Squamous Epithelial 6-10 (A) NONE SEEN  Blood gas, venous (at [facility])  Result Value Ref Range   FIO2 21.00    pH, Ven 7.419 7.25 - 7.43   pCO2, Ven 37.7 (L) 44 - 60 mmHg   pO2, Ven 130.0 (H) 32 - 45 mmHg   Bicarbonate 24.6 20.0 - 28.0 mmol/L   Acid-base deficit 0.0 0.0 - 2.0 mmol/L   O2 Saturation 98.6 %   Patient temperature 37.0     DG Chest Portable 1 View  Result Date: Sep 21, 2019 CLINICAL DATA:  Multiple recent falls with shortness of breath. EXAM: PORTABLE CHEST 1 VIEW COMPARISON:  November 16, 2019 FINDINGS: Multiple sternal wires are present. Mild areas of atelectasis and/or infiltrate are seen within the bilateral lung bases. Small bilateral pleural effusions are noted. No pneumothorax is identified. The cardiac silhouette is markedly enlarged and unchanged in size. Marked severity calcification of the aortic arch is seen. Multilevel degenerative changes are noted throughout the thoracic spine. IMPRESSION: 1. Mild bibasilar atelectasis and/or infiltrate. 2.  Small bilateral pleural effusions. 3. Stable cardiomegaly. Electronically Signed   By: Aram Candelahaddeus  Houston M.D.   On: 0Jul 19, 2021 20:35     Procedures Procedures (including critical care time)  Medications Ordered in ED Medications  cefTRIAXone (ROCEPHIN) 1 g in sodium chloride 0.9 % 100 mL IVPB (1 g Intravenous New Bag/Given (Non-Interop) 11/30/2019 0022)  sodium chloride 0.9 % bolus 500 mL (0 mLs Intravenous Stopped 09-08-2019 2250)    ED Course  I have reviewed the triage vital signs and the nursing notes.  Pertinent labs & imaging results that were available during my care of the patient were reviewed by me and considered in my medical decision making (see chart for details).    MDM Rules/Calculators/A&P                          Pt returns tonight after multiple falls yesterday with complaint of neck pain, fatigue and generalized weakness.  Pt is very drowsy, A&O x 3, but quickly falls asleep, sometimes mid sentence.  Acute uti suspected based on urinary frequency, lethargy. WBC's in urine although rare bacteria - this was a clean specimen,  culture pending.  Also possible early infiltrate on cxr. Pt has sob, but baseline per her COPD sx.  She was  given IV rocephin to cover her for uti and possible early pneumonia.  zithromax also ordered.  Pt with weakness, lethargy, she would benefit from admission, perhaps overnight obs and consideration of TOC consult and PT given recent home falls.  Discussed with Dr. Thomes Dinning who accepts pt for admission.       Final Clinical Impression(s) / ED Diagnoses Final diagnoses:  Lethargy  Acute cystitis without hematuria  Dehydration  Acute renal insufficiency  Neck pain    Rx / DC Orders ED Discharge Orders    None       Victoriano Lain 11/08/2019 0130    Milagros Loll, MD 11/16/2019 1224

## 2019-11-18 ENCOUNTER — Emergency Department (HOSPITAL_COMMUNITY): Payer: Medicare Other

## 2019-11-18 DIAGNOSIS — J449 Chronic obstructive pulmonary disease, unspecified: Secondary | ICD-10-CM | POA: Diagnosis present

## 2019-11-18 DIAGNOSIS — R531 Weakness: Secondary | ICD-10-CM

## 2019-11-18 DIAGNOSIS — N289 Disorder of kidney and ureter, unspecified: Secondary | ICD-10-CM | POA: Insufficient documentation

## 2019-11-18 DIAGNOSIS — Z66 Do not resuscitate: Secondary | ICD-10-CM | POA: Diagnosis not present

## 2019-11-18 DIAGNOSIS — I2510 Atherosclerotic heart disease of native coronary artery without angina pectoris: Secondary | ICD-10-CM | POA: Diagnosis present

## 2019-11-18 DIAGNOSIS — J9622 Acute and chronic respiratory failure with hypercapnia: Secondary | ICD-10-CM

## 2019-11-18 DIAGNOSIS — J9602 Acute respiratory failure with hypercapnia: Secondary | ICD-10-CM | POA: Diagnosis present

## 2019-11-18 DIAGNOSIS — M542 Cervicalgia: Secondary | ICD-10-CM | POA: Diagnosis not present

## 2019-11-18 DIAGNOSIS — A419 Sepsis, unspecified organism: Secondary | ICD-10-CM

## 2019-11-18 DIAGNOSIS — M7989 Other specified soft tissue disorders: Secondary | ICD-10-CM | POA: Diagnosis not present

## 2019-11-18 DIAGNOSIS — I517 Cardiomegaly: Secondary | ICD-10-CM | POA: Diagnosis not present

## 2019-11-18 DIAGNOSIS — D72829 Elevated white blood cell count, unspecified: Secondary | ICD-10-CM | POA: Diagnosis present

## 2019-11-18 DIAGNOSIS — J90 Pleural effusion, not elsewhere classified: Secondary | ICD-10-CM | POA: Diagnosis present

## 2019-11-18 DIAGNOSIS — N179 Acute kidney failure, unspecified: Secondary | ICD-10-CM | POA: Diagnosis present

## 2019-11-18 DIAGNOSIS — R296 Repeated falls: Secondary | ICD-10-CM

## 2019-11-18 DIAGNOSIS — Z515 Encounter for palliative care: Secondary | ICD-10-CM

## 2019-11-18 DIAGNOSIS — I471 Supraventricular tachycardia: Secondary | ICD-10-CM | POA: Diagnosis present

## 2019-11-18 DIAGNOSIS — R079 Chest pain, unspecified: Secondary | ICD-10-CM | POA: Diagnosis not present

## 2019-11-18 DIAGNOSIS — M79604 Pain in right leg: Secondary | ICD-10-CM | POA: Diagnosis not present

## 2019-11-18 DIAGNOSIS — M79605 Pain in left leg: Secondary | ICD-10-CM | POA: Diagnosis not present

## 2019-11-18 DIAGNOSIS — I6523 Occlusion and stenosis of bilateral carotid arteries: Secondary | ICD-10-CM | POA: Diagnosis not present

## 2019-11-18 DIAGNOSIS — I708 Atherosclerosis of other arteries: Secondary | ICD-10-CM | POA: Diagnosis not present

## 2019-11-18 DIAGNOSIS — K219 Gastro-esophageal reflux disease without esophagitis: Secondary | ICD-10-CM | POA: Diagnosis present

## 2019-11-18 DIAGNOSIS — G9341 Metabolic encephalopathy: Secondary | ICD-10-CM | POA: Diagnosis present

## 2019-11-18 DIAGNOSIS — I4892 Unspecified atrial flutter: Secondary | ICD-10-CM | POA: Diagnosis present

## 2019-11-18 DIAGNOSIS — I4891 Unspecified atrial fibrillation: Secondary | ICD-10-CM | POA: Diagnosis not present

## 2019-11-18 DIAGNOSIS — N3000 Acute cystitis without hematuria: Secondary | ICD-10-CM

## 2019-11-18 DIAGNOSIS — J9811 Atelectasis: Secondary | ICD-10-CM | POA: Diagnosis present

## 2019-11-18 DIAGNOSIS — I25810 Atherosclerosis of coronary artery bypass graft(s) without angina pectoris: Secondary | ICD-10-CM

## 2019-11-18 DIAGNOSIS — N1832 Chronic kidney disease, stage 3b: Secondary | ICD-10-CM

## 2019-11-18 DIAGNOSIS — I5032 Chronic diastolic (congestive) heart failure: Secondary | ICD-10-CM | POA: Diagnosis present

## 2019-11-18 DIAGNOSIS — J9621 Acute and chronic respiratory failure with hypoxia: Secondary | ICD-10-CM | POA: Diagnosis present

## 2019-11-18 DIAGNOSIS — I771 Stricture of artery: Secondary | ICD-10-CM | POA: Diagnosis not present

## 2019-11-18 DIAGNOSIS — R6521 Severe sepsis with septic shock: Secondary | ICD-10-CM | POA: Diagnosis present

## 2019-11-18 DIAGNOSIS — J189 Pneumonia, unspecified organism: Secondary | ICD-10-CM | POA: Diagnosis present

## 2019-11-18 DIAGNOSIS — J438 Other emphysema: Secondary | ICD-10-CM

## 2019-11-18 DIAGNOSIS — E871 Hypo-osmolality and hyponatremia: Secondary | ICD-10-CM | POA: Diagnosis present

## 2019-11-18 DIAGNOSIS — I672 Cerebral atherosclerosis: Secondary | ICD-10-CM | POA: Diagnosis not present

## 2019-11-18 DIAGNOSIS — R109 Unspecified abdominal pain: Secondary | ICD-10-CM | POA: Diagnosis not present

## 2019-11-18 DIAGNOSIS — Z20822 Contact with and (suspected) exposure to covid-19: Secondary | ICD-10-CM | POA: Diagnosis present

## 2019-11-18 DIAGNOSIS — W06XXXA Fall from bed, initial encounter: Secondary | ICD-10-CM | POA: Diagnosis present

## 2019-11-18 DIAGNOSIS — N183 Chronic kidney disease, stage 3 unspecified: Secondary | ICD-10-CM | POA: Diagnosis present

## 2019-11-18 DIAGNOSIS — Z951 Presence of aortocoronary bypass graft: Secondary | ICD-10-CM | POA: Diagnosis not present

## 2019-11-18 DIAGNOSIS — I130 Hypertensive heart and chronic kidney disease with heart failure and stage 1 through stage 4 chronic kidney disease, or unspecified chronic kidney disease: Secondary | ICD-10-CM | POA: Diagnosis present

## 2019-11-18 DIAGNOSIS — E872 Acidosis: Secondary | ICD-10-CM | POA: Diagnosis present

## 2019-11-18 LAB — CBC WITH DIFFERENTIAL/PLATELET
Abs Immature Granulocytes: 0.33 10*3/uL — ABNORMAL HIGH (ref 0.00–0.07)
Basophils Absolute: 0.1 10*3/uL (ref 0.0–0.1)
Basophils Relative: 0 %
Eosinophils Absolute: 0 10*3/uL (ref 0.0–0.5)
Eosinophils Relative: 0 %
HCT: 46.7 % — ABNORMAL HIGH (ref 36.0–46.0)
Hemoglobin: 13.7 g/dL (ref 12.0–15.0)
Immature Granulocytes: 2 %
Lymphocytes Relative: 20 %
Lymphs Abs: 3.4 10*3/uL (ref 0.7–4.0)
MCH: 28.1 pg (ref 26.0–34.0)
MCHC: 29.3 g/dL — ABNORMAL LOW (ref 30.0–36.0)
MCV: 95.7 fL (ref 80.0–100.0)
Monocytes Absolute: 1.1 10*3/uL — ABNORMAL HIGH (ref 0.1–1.0)
Monocytes Relative: 7 %
Neutro Abs: 11.6 10*3/uL — ABNORMAL HIGH (ref 1.7–7.7)
Neutrophils Relative %: 71 %
Platelets: 172 10*3/uL (ref 150–400)
RBC: 4.88 MIL/uL (ref 3.87–5.11)
RDW: 17.5 % — ABNORMAL HIGH (ref 11.5–15.5)
WBC: 16.5 10*3/uL — ABNORMAL HIGH (ref 4.0–10.5)
nRBC: 0.2 % (ref 0.0–0.2)

## 2019-11-18 LAB — BLOOD GAS, ARTERIAL
Acid-base deficit: 9.9 mmol/L — ABNORMAL HIGH (ref 0.0–2.0)
Acid-base deficit: 10.1 mmol/L — ABNORMAL HIGH (ref 0.0–2.0)
Bicarbonate: 14.8 mmol/L — ABNORMAL LOW (ref 20.0–28.0)
Bicarbonate: 14.9 mmol/L — ABNORMAL LOW (ref 20.0–28.0)
Drawn by: 27407
FIO2: 100
FIO2: 100
MECHVT: 450 mL
O2 Saturation: 95.7 %
O2 Saturation: 97.4 %
Patient temperature: 37
Patient temperature: 37
pCO2 arterial: 65.2 mmHg (ref 32.0–48.0)
pCO2 arterial: 64.1 mmHg — ABNORMAL HIGH (ref 32.0–48.0)
pH, Arterial: 7.082 — CL (ref 7.350–7.450)
pH, Arterial: 7.084 — CL (ref 7.350–7.450)
pO2, Arterial: 121 mmHg — ABNORMAL HIGH (ref 83.0–108.0)
pO2, Arterial: 159 mmHg — ABNORMAL HIGH (ref 83.0–108.0)

## 2019-11-18 LAB — SARS CORONAVIRUS 2 BY RT PCR (HOSPITAL ORDER, PERFORMED IN ~~LOC~~ HOSPITAL LAB): SARS Coronavirus 2: NEGATIVE

## 2019-11-18 LAB — CBG MONITORING, ED: Glucose-Capillary: 138 mg/dL — ABNORMAL HIGH (ref 70–99)

## 2019-11-18 LAB — COMPREHENSIVE METABOLIC PANEL
ALT: 17 U/L (ref 0–44)
AST: 26 U/L (ref 15–41)
Albumin: 2.9 g/dL — ABNORMAL LOW (ref 3.5–5.0)
Alkaline Phosphatase: 95 U/L (ref 38–126)
Anion gap: 16 — ABNORMAL HIGH (ref 5–15)
BUN: 37 mg/dL — ABNORMAL HIGH (ref 8–23)
CO2: 22 mmol/L (ref 22–32)
Calcium: 8.1 mg/dL — ABNORMAL LOW (ref 8.9–10.3)
Chloride: 95 mmol/L — ABNORMAL LOW (ref 98–111)
Creatinine, Ser: 1.75 mg/dL — ABNORMAL HIGH (ref 0.44–1.00)
GFR calc Af Amer: 30 mL/min — ABNORMAL LOW (ref >60)
GFR calc non Af Amer: 26 mL/min — ABNORMAL LOW (ref >60)
Glucose, Bld: 233 mg/dL — ABNORMAL HIGH (ref 70–99)
Potassium: 5 mmol/L (ref 3.5–5.1)
Sodium: 133 mmol/L — ABNORMAL LOW (ref 135–145)
Total Bilirubin: 1.4 mg/dL — ABNORMAL HIGH (ref 0.3–1.2)
Total Protein: 6 g/dL — ABNORMAL LOW (ref 6.5–8.1)

## 2019-11-18 LAB — TRIGLYCERIDES: Triglycerides: 92 mg/dL (ref <150)

## 2019-11-18 LAB — TROPONIN I (HIGH SENSITIVITY)
Troponin I (High Sensitivity): 63 ng/L — ABNORMAL HIGH (ref <18)
Troponin I (High Sensitivity): 81 ng/L — ABNORMAL HIGH

## 2019-11-18 LAB — LACTIC ACID, PLASMA
Lactic Acid, Venous: 7.3 mmol/L (ref 0.5–1.9)
Lactic Acid, Venous: 6.2 mmol/L (ref 0.5–1.9)

## 2019-11-18 LAB — PROCALCITONIN: Procalcitonin: 0.31 ng/mL

## 2019-11-18 MED ORDER — SODIUM CHLORIDE 0.9 % IV SOLN
2.0000 g | INTRAVENOUS | Status: DC
  Administered 2019-11-18: 2 g via INTRAVENOUS
  Filled 2019-11-18: qty 2

## 2019-11-18 MED ORDER — DILTIAZEM HCL-DEXTROSE 125-5 MG/125ML-% IV SOLN (PREMIX)
5.0000 mg/h | INTRAVENOUS | Status: DC
  Administered 2019-11-18: 5 mg/h via INTRAVENOUS
  Filled 2019-11-18: qty 125

## 2019-11-18 MED ORDER — SODIUM CHLORIDE 0.9 % IV SOLN
1.0000 mg/h | INTRAVENOUS | Status: DC
  Administered 2019-11-18: 1 mg/h via INTRAVENOUS
  Filled 2019-11-18: qty 2.5

## 2019-11-18 MED ORDER — VANCOMYCIN HCL 750 MG/150ML IV SOLN
750.0000 mg | Freq: Once | INTRAVENOUS | Status: AC
  Administered 2019-11-18: 750 mg via INTRAVENOUS
  Filled 2019-11-18 (×2): qty 150

## 2019-11-18 MED ORDER — PROPOFOL 1000 MG/100ML IV EMUL
5.0000 ug/kg/min | INTRAVENOUS | Status: DC
  Administered 2019-11-18: 15 ug/kg/min via INTRAVENOUS
  Filled 2019-11-18: qty 100

## 2019-11-18 MED ORDER — HYDROMORPHONE HCL 1 MG/ML IJ SOLN
1.0000 mg | INTRAMUSCULAR | Status: AC
  Administered 2019-11-18: 1 mg via INTRAVENOUS
  Filled 2019-11-18: qty 1

## 2019-11-18 MED ORDER — STERILE WATER FOR INJECTION IV SOLN
INTRAVENOUS | Status: DC
  Filled 2019-11-18 (×2): qty 850

## 2019-11-18 MED ORDER — VANCOMYCIN HCL 1750 MG/350ML IV SOLN: 1750.0000 mg | Freq: Once | INTRAVENOUS | Status: DC

## 2019-11-18 MED ORDER — IPRATROPIUM-ALBUTEROL 0.5-2.5 (3) MG/3ML IN SOLN: 3.0000 mL | RESPIRATORY_TRACT | Status: DC | PRN

## 2019-11-18 MED ORDER — SODIUM CHLORIDE 0.9 % IV SOLN
2.0000 g | Freq: Once | INTRAVENOUS | Status: DC
  Filled 2019-11-18: qty 2

## 2019-11-18 MED ORDER — PROPOFOL 1000 MG/100ML IV EMUL
INTRAVENOUS | Status: AC
  Administered 2019-11-18: 15 ug/kg/min via INTRAVENOUS
  Filled 2019-11-18: qty 100

## 2019-11-18 MED ORDER — SODIUM CHLORIDE 0.9 % IV SOLN
1.0000 g | Freq: Once | INTRAVENOUS | Status: AC
  Administered 2019-11-18: 1 g via INTRAVENOUS
  Filled 2019-11-18: qty 10

## 2019-11-18 MED ORDER — FENTANYL CITRATE (PF) 100 MCG/2ML IJ SOLN: 50.0000 ug | INTRAMUSCULAR | Status: DC | PRN

## 2019-11-18 MED ORDER — NOREPINEPHRINE 4 MG/250ML-% IV SOLN
0.0000 ug/min | INTRAVENOUS | Status: DC
  Administered 2019-11-18: 25 ug/min via INTRAVENOUS
  Administered 2019-11-18: 29 ug/min via INTRAVENOUS
  Filled 2019-11-18 (×3): qty 250

## 2019-11-18 MED ORDER — PANTOPRAZOLE SODIUM 40 MG IV SOLR: 40.0000 mg | Freq: Every day | INTRAVENOUS | Status: DC

## 2019-11-18 MED ORDER — LORAZEPAM 2 MG/ML IJ SOLN: 1.0000 mg | INTRAMUSCULAR | Status: DC | PRN

## 2019-11-18 MED ORDER — GLYCOPYRROLATE 0.2 MG/ML IJ SOLN
0.2000 mg | INTRAMUSCULAR | Status: DC | PRN
  Administered 2019-11-18: 0.2 mg via INTRAVENOUS
  Filled 2019-11-18: qty 1

## 2019-11-18 MED ORDER — VANCOMYCIN HCL IN DEXTROSE 1-5 GM/200ML-% IV SOLN: 1000.0000 mg | INTRAVENOUS | Status: DC

## 2019-11-18 MED ORDER — HYDROMORPHONE BOLUS VIA INFUSION
0.5000 mg | INTRAVENOUS | Status: DC | PRN
  Filled 2019-11-18: qty 1

## 2019-11-18 MED ORDER — DILTIAZEM LOAD VIA INFUSION
15.0000 mg | Freq: Once | INTRAVENOUS | Status: AC
  Administered 2019-11-18: 15 mg via INTRAVENOUS
  Filled 2019-11-18: qty 15

## 2019-11-18 MED ORDER — NOREPINEPHRINE 4 MG/250ML-% IV SOLN
INTRAVENOUS | Status: AC
  Administered 2019-11-18: 30 ug/min via INTRAVENOUS
  Filled 2019-11-18: qty 250

## 2019-11-18 MED ORDER — VANCOMYCIN HCL IN DEXTROSE 1-5 GM/200ML-% IV SOLN
1000.0000 mg | Freq: Once | INTRAVENOUS | Status: DC
  Administered 2019-11-18: 1000 mg via INTRAVENOUS
  Filled 2019-11-18: qty 200

## 2019-11-18 MED ORDER — LEVOTHYROXINE SODIUM 100 MCG/5ML IV SOLN: 37.5000 ug | Freq: Every day | INTRAVENOUS | Status: DC

## 2019-11-18 MED ORDER — IOHEXOL 350 MG/ML SOLN
150.0000 mL | Freq: Once | INTRAVENOUS | Status: AC | PRN
  Administered 2019-11-18: 150 mL via INTRAVENOUS

## 2019-11-18 MED ORDER — DIPHENHYDRAMINE HCL 50 MG/ML IJ SOLN
25.0000 mg | Freq: Four times a day (QID) | INTRAMUSCULAR | Status: DC | PRN
  Administered 2019-11-18: 25 mg via INTRAVENOUS
  Filled 2019-11-18: qty 1

## 2019-11-18 MED ORDER — SODIUM BICARBONATE 8.4 % IV SOLN: 50.0000 meq | Freq: Once | INTRAVENOUS | Status: DC

## 2019-11-18 MED ORDER — LACTATED RINGERS IV BOLUS
2000.0000 mL | Freq: Once | INTRAVENOUS | Status: AC
  Administered 2019-11-18: 2000 mL via INTRAVENOUS

## 2019-11-18 MED ORDER — METRONIDAZOLE IN NACL 5-0.79 MG/ML-% IV SOLN
500.0000 mg | Freq: Once | INTRAVENOUS | Status: AC
  Administered 2019-11-18: 500 mg via INTRAVENOUS
  Filled 2019-11-18: qty 100

## 2019-11-18 MED ORDER — SODIUM CHLORIDE 0.9 % IV SOLN
500.0000 mg | Freq: Once | INTRAVENOUS | Status: AC
  Administered 2019-11-18: 500 mg via INTRAVENOUS
  Filled 2019-11-18: qty 500

## 2019-11-19 LAB — URINE CULTURE: Culture: NO GROWTH

## 2019-11-22 LAB — CULTURE, BLOOD (ROUTINE X 2)
Culture: NO GROWTH
Culture: NO GROWTH
Special Requests: ADEQUATE
Special Requests: ADEQUATE

## 2019-11-24 MED FILL — Medication: Qty: 1 | Status: AC

## 2019-12-04 NOTE — H&P (Signed)
History and Physical  Julie Burns AOZ:[id number] DOB: [birth year] DOA: 07/31/19  Referring physician: Burgess AmorIdol, Julie, PA-C PCP: Kirstie PeriShah, Ashish, MD  Patient coming from: Home  Chief Complaint: Neck pain  HPI: Julie LunaMary H Burns is a 86 y.o. female with medical history significant for type 2 diabetes, COPD on chronic home O2, chronic kidney disease, CAD, atrial fibrillation, history of non-STEMI who presents to the emergency department due to neck pain.  History cannot be obtained from patient due to patient being in acute respiratory distress.  History was obtained from ED PA, per report, patient was seen in the ED in the evening of 9/13 after falling out of bed x2 injuring her head, neck, right ankle and left chest wall.  Imaging were done at that time and there were no acute injuries, so patient was discharged home, she returned last night due to persistent left-sided neck pain which was aggravated with movement and with no elevating factor.  Patient was also reported to complain of increased urinary frequency, fatigue and drowsiness. Patient lives alone, but has caregiver during the daytime, patient was sent to the ED by family because they were concerned about her staying alone due to her current condition.  Patient was not sure she would be able to take care of herself due to current weakness.  ED Course:  In the emergency department, vital signs were stable.  Work-up in the ED showed leukocytosis, hyponatremia, BUN to creatinine 36/1.38, urinalysis was unimpressive for UTI, lactic acid was 1.9> 7.3.  Albumin 3.3.  Chest x-ray showed mild bibasilar atelectasis and/or infiltrate and small bilateral pleural effusions with stable cardiomegaly.  She was empirically's treated with IV ceftriaxone and azithromycin, IV Cardizem and 2 L of IV LR were also given.  Hospitalist was asked to admit patient for further evaluation management. At bedside, patient was noted to be in respiratory distress, she was noted to  be pale with cyanotic lips and was having agonal breaths.  Breathing support was able to be provided when patient lost pulse and ACLS was started.  Patient had ROSC after 1 round of chest compressions and IV epinephrine, she was intubated and was sedated with propofol.  IV Levophed was started.  Review of Systems: This cannot be obtained at this time due to patient's current condition   Past Medical History:  Diagnosis Date  . Age-related osteoporosis without current pathological fracture   . Anxiety disorder   . Atrial fibrillation (HCC)   . CAD (coronary artery disease)    12/2013-DES to RCA due to NSTEMI- staged PCI to LAD and CX (not ideal for CABG; Echocardiogram 01/16/10 EF 55%, severe LVH; Moderate AS; mild ot moderate TR  . Chronic kidney disease   . Chronic respiratory failure with hypoxia (HCC)   . COPD (chronic obstructive pulmonary disease) (HCC)   . Degenerative myopia with macular hole   . Diabetes (HCC)    TYPE 2  . Hyperlipidemia   . Hypothyroidism   . Irritable bowel syndrome without diarrhea   . NSTEMI (non-ST elevated myocardial infarction) (HCC)    NSTEMI 3/05; s/p PTCA with stenting of left circumflex coronary artery 3/05; totally occluded small vessel OM  . Occlusion and stenosis of right carotid artery    Carotid Duplex 7/13 moderate disease (<70%) of the right internal carotid arteries, this is a high bifurcation, s/p left CEA without significant stenosis.  . Polyp of colon   . Presence of prosthetic heart valve    S/P aortic valve replacement 04/29/2008   .  PVD (peripheral vascular disease) (HCC)   . Sicca syndrome (HCC)   . Stroke (cerebrum) (HCC)   . Unspecified abdominal hernia without obstruction or gangrene   . Vitamin D deficiency    Past Surgical History:  Procedure Laterality Date  . ABDOMINAL HERNIA REPAIR     per patient x 3  . AORTIC VALVE REPLACEMENT     S/P aortic valve replacement 04/29/2008   . CARDIAC CATHETERIZATION     by Flonnie Hailstone at [facility]; mLAD 30%, patent LCx stent, mRCA 50%, LVEDP 10, severe AS with AV mean gradient 46 mmHg, Right heart catheterization   . CARDIOVERSION N/A 06/08/2019   Procedure: CARDIOVERSION;  Surgeon: Antoine Poche, MD;  Location: [facility];  Service: Endoscopy;  Laterality: N/A;  . CAROTID ENDARTERECTOMY     Carotid Duplex 7/13 moderate disease (<70%) of the right internal carotid arteries, this is a high bifurcation, s/p left CEA without significant stenosis.  . COLONOSCOPY     per patient- several in the past in [facility], couple of polyps, last TCS between 5-10 years ago.   [name] MASTECTOMY Left 2013   breast cancer    Social History:  reports that she quit smoking about 43 years ago. Her smoking use included cigarettes. She smoked 3.00 packs per day. She has never used smokeless tobacco. She reports that she does not drink alcohol and does not use drugs.   Allergies  Allergen Reactions  . Statins Other (See Comments)    Caused muscle aches and joint pain.  Armond Hang [Naproxen Sodium] Other (See Comments)    Makes me fell "drunk-like"  . Bee Venom Swelling  . Codeine Hives  . Ibuprofen Other (See Comments)    "drunk-like" feeling  . Morphine And Related Other (See Comments)    Patient states she "felt weird".  . Norvasc [Amlodipine Besylate] Swelling  . Prednisone Swelling  . Sulfa Antibiotics     whelps  . Tylenol [Acetaminophen] Other (See Comments)    "drunk-like feeling"  . Valium [Diazepam]   . Zetia [Ezetimibe]   . Penicillins Hives and Rash    Has patient had a PCN reaction causing immediate rash, facial/tongue/throat swelling, SOB or lightheadedness with hypotension: yes Has patient had a PCN reaction causing severe rash involving mucus membranes or skin necrosis: Unknown Has patient had a PCN reaction that required hospitalization: No Has patient had a PCN reaction occurring within the last 10 years: No If all of the above answers are "NO", then may proceed with  Cephalosporin use.     Family History  Problem Relation Age of Onset  . Hypertension Mother   . CAD Mother   . Hypertension Father   . CAD Father   . Hypertension Sister   . CAD Sister   . Hypertension Brother   . CAD Brother   . Diabetes Brother   . Hypertension Brother   . Diabetes Brother   . COPD Son    Prior to Admission medications   Medication Sig Start Date End Date Taking? Authorizing Provider  alendronate (FOSAMAX) 70 MG tablet Take 70 mg by mouth once a week. Take with a full glass of water on an empty stomach. Take on Sunday's.    [provider]  apixaban (ELIQUIS) 5 MG TABS tablet Take 5 mg by mouth 2 (two) times daily.    [provider]  benzonatate (TESSALON) 200 MG capsule Take 200 mg by mouth 3 (three) times daily as needed for cough.  [provider]  budesonide (PULMICORT) 0.5 MG/2ML nebulizer solution Take 2 mLs (0.5 mg total) by nebulization 2 (two) times daily. 12/15/18   Mechele Claude, MD  Cholecalciferol (VITAMIN D3) 5000 units CAPS Take 1 capsule by mouth daily.    [provider]  diltiazem (CARDIZEM CD) 240 MG 24 hr capsule Take 1 capsule (240 mg total) by mouth daily. 08/17/19 11/15/19  Netta Neat., NP  Ipratropium-Albuterol (COMBIVENT RESPIMAT) 20-100 MCG/ACT AERS respimat Inhale 1 puff into the lungs every 4 (four) hours as needed for wheezing or shortness of breath.  05/10/17   [provider]  ipratropium-albuterol (DUONEB) 0.5-2.5 (3) MG/3ML SOLN Take 3 mLs by nebulization every 6 (six) hours as needed (shortness of breath).     [provider]  isosorbide mononitrate (IMDUR) 60 MG 24 hr tablet Take 1 tablet (60 mg total) by mouth daily. 06/11/19 09/09/19  Shon Hale, MD  levothyroxine (SYNTHROID, LEVOTHROID) 75 MCG tablet Take 75 mcg by mouth See admin instructions. Take 75 mcg every Sun, Tues, Wed, Fri, and Sat.  Take 112.5 mcg every Mon and Thurs.    [provider]  LORazepam  (ATIVAN) 0.5 MG tablet Take 0.5 mg by mouth 2 (two) times daily as needed for anxiety or sleep.     [provider]  metoprolol succinate (TOPROL-XL) 100 MG 24 hr tablet Take 1 tablet (100 mg total) by mouth in the morning and at bedtime. Take with or immediately following a meal. 08/17/19 11/15/19  Netta Neat., NP  Multiple Vitamins-Minerals (ICAPS AREDS FORMULA PO) Take 1 capsule by mouth 2 (two) times daily.     [provider]  nitroGLYCERIN (NITROSTAT) 0.4 MG SL tablet PLACE 1 TABLET UNDER THE TONGUE AT ONSET OF CHEST PAIN EVERY 5 MINTUES UP TO 3 TIMES AS NEEDED Patient taking differently: Place 0.4 mg under the tongue every 5 (five) minutes as needed.  11/20/18   Rollene Rotunda, MD  nystatin-triamcinolone ointment [facility]) Apply 1 application topically 2 (two) times daily as needed (rash).     [provider]  ondansetron (ZOFRAN-ODT) 4 MG disintegrating tablet Take 1 tablet (4 mg total) by mouth every 8 (eight) hours as needed for nausea or vomiting. 08/19/19   Tiffany Kocher, PA-C  pantoprazole (PROTONIX) 40 MG tablet Take 1 tablet (40 mg total) by mouth daily. 06/11/19   Shon Hale, MD  polyethylene glycol (MIRALAX / GLYCOLAX) packet Take 17 g by mouth daily.    [provider]  Tiotropium Bromide-Olodaterol (STIOLTO RESPIMAT) 2.5-2.5 MCG/ACT AERS Inhale 2 puffs into the lungs daily. 05/20/19   Steffanie Dunn, DO  torsemide (DEMADEX) 20 MG tablet Take 2 tablets (40 mg total) by mouth daily. 06/12/19   Shon Hale, MD  traMADol (ULTRAM) 50 MG tablet Take 50 mg by mouth every 6 (six) hours as needed for moderate pain.     [provider]    Physical Exam: BP 110/71   Pulse 66   Temp 98.9 F (37.2 C) (Oral)   Resp 20   Ht 5' 6.5" (1.689 m)   Wt 91 kg   SpO2 100%   BMI 31.90 kg/m   . General: 86 y.o. year-old female well developed well nourished in no acute distress.  Alert and oriented x3. [name] HEENT: Hematoma with contusion  noted on the right forehead . Cardiovascular: Regular rate and rhythm with no rubs or gallops.  No thyromegaly or JVD noted.   [name] Respiratory: Mild crackles noted  in lower lobes bilaterally.  . Abdomen: Soft nontender nondistended with normal bowel sounds x4 quadrants. . Muskuloskeletal: No cyanosis, bilateral lower extremity edema (RLE > LLE)  . Neuro: Pupil constricted. rest of neurological exam cannot be performed at this time due to patient's current condition  . Skin: No ulcerative lesions noted or rashes . Psychiatry: This cannot be assessed at this time due to patient's current condition         Labs on Admission:  Basic Metabolic Panel: Recent Labs  Lab 11/14/2019 2103 Nov 20, 2019 0403  NA 132* 133*  K 4.2 5.0  CL 95* 95*  CO2 23 22  GLUCOSE 138* 233*  BUN 36* 37*  CREATININE 1.38* 1.75*  CALCIUM 8.5* 8.1*   Liver Function Tests: Recent Labs  Lab 11/22/2019 2103 2019/11/20 0403  AST 21 26  ALT 15 17  ALKPHOS 102 95  BILITOT 1.7* 1.4*  PROT 6.4* 6.0*  ALBUMIN 3.3* 2.9*   Recent Labs  Lab 11/22/2019 2103  LIPASE 27   No results for input(s): AMMONIA in the last 168 hours. CBC: Recent Labs  Lab 11/16/2019 2103 2019-11-20 0403  WBC 15.3* 16.5*  NEUTROABS 12.7* 11.6*  HGB 13.8 13.7  HCT 45.0 46.7*  MCV 90.7 95.7  PLT 184 172   Cardiac Enzymes: No results for input(s): CKTOTAL, CKMB, CKMBINDEX, TROPONINI in the last 168 hours.  BNP (last 3 results) Recent Labs    06/03/19 1127 06/10/19 0520  BNP 1,086.0* 643.0*    ProBNP (last 3 results) No results for input(s): PROBNP in the last 8760 hours.  CBG: Recent Labs  Lab 2019/11/20 0318  GLUCAP 138*    Radiological Exams on Admission: CT Angio Head W or Wo Contrast  Result Date: 11-20-19 CLINICAL DATA:  Left-sided neck pain EXAM: CT ANGIOGRAPHY HEAD AND NECK TECHNIQUE: Multidetector CT imaging of the head and neck was performed using the standard protocol during bolus administration of intravenous contrast.  Multiplanar CT image reconstructions and MIPs were obtained to evaluate the vascular anatomy. Carotid stenosis measurements (when applicable) are obtained utilizing NASCET criteria, using the distal internal carotid diameter as the denominator. CONTRAST:  OMNIPAQUE IOHEXOL 350 MG/ML SOLN COMPARISON:  Head CT from 2 days ago FINDINGS: CT HEAD FINDINGS Brain: No evidence of acute infarction, hemorrhage, hydrocephalus, extra-axial collection or mass lesion/mass effect. Confluent chronic small vessel ischemia in the cerebral white matter. Chronic lacune at the left corona radiata and infra medial thalamus. Vascular: See below Skull: No acute finding.  Hyperostosis interna. Sinuses: Clear Orbits: No acute finding Review of the MIP images confirms the above findings CTA NECK FINDINGS Aortic arch: Atheromatous plaque with 2 vessel branching. Reference dedicated chest CTA performed at the same time. Right carotid system: Diffuse atheromatous wall thickening of the common carotid with calcified plaque at the bifurcation. ICA origin stenosis measures 75% at least based on sagittal reformat measurement. Left carotid system: Atheromatous wall thickening diffusely with multifocal calcified plaque. No flow limiting stenosis or ulceration. Vertebral arteries: Proximal subclavian atherosclerosis on both sides with 50% stenosis at the right subclavian origin based on sagittal reformats. Left dominant vertebral artery. Both vertebral arteries are patent to the basilar without beading or flow reducing stenosis. Skeleton: No acute or aggressive finding Other neck: Solitary enlarged lymph node in the low right jugular chain measuring 13 mm. Upper chest: Chest CT reported separately. Review of the MIP images confirms the above findings CTA HEAD FINDINGS Anterior circulation: The right ICA is smaller than the left in the  setting of hypoplastic right A1 segment and possibly also due to under filling from the severe cervical stenosis.  No branch occlusion, aneurysm, or generalized beading. There is diffuse atherosclerotic calcification with mild-to-moderate right siphon stenosis, quantification limited by vessel size and tortuosity with calcified plaque blooming. Posterior circulation: Left dominant vertebral artery. The vertebral and basilar arteries are widely patent. No PCA branch occlusion or proximal stenosis. Venous sinuses: Diffusely patent Anatomic variants: As above Review of the MIP images confirms the above findings IMPRESSION: 1. No emergent finding. 2. Severe proximal right ICA stenosis due to calcified plaque. 3. 50% stenosis at the proximal right subclavian artery. 4. Solitary enlarged right jugular chain lymph node, nonspecific in isolation. Recommend follow-up after convalescence. 5. Chronic small vessel disease and chronic lacunar infarcts. Electronically Signed   By: Marnee Spring M.D.   On: 2019/11/20 06:30   CT Angio Neck W and/or Wo Contrast  Result Date: 20-Nov-2019 CLINICAL DATA:  Left-sided neck pain EXAM: CT ANGIOGRAPHY HEAD AND NECK TECHNIQUE: Multidetector CT imaging of the head and neck was performed using the standard protocol during bolus administration of intravenous contrast. Multiplanar CT image reconstructions and MIPs were obtained to evaluate the vascular anatomy. Carotid stenosis measurements (when applicable) are obtained utilizing NASCET criteria, using the distal internal carotid diameter as the denominator. CONTRAST:  OMNIPAQUE IOHEXOL 350 MG/ML SOLN COMPARISON:  Head CT from 2 days ago FINDINGS: CT HEAD FINDINGS Brain: No evidence of acute infarction, hemorrhage, hydrocephalus, extra-axial collection or mass lesion/mass effect. Confluent chronic small vessel ischemia in the cerebral white matter. Chronic lacune at the left corona radiata and infra medial thalamus. Vascular: See below Skull: No acute finding.  Hyperostosis interna. Sinuses: Clear Orbits: No acute finding Review of the MIP images  confirms the above findings CTA NECK FINDINGS Aortic arch: Atheromatous plaque with 2 vessel branching. Reference dedicated chest CTA performed at the same time. Right carotid system: Diffuse atheromatous wall thickening of the common carotid with calcified plaque at the bifurcation. ICA origin stenosis measures 75% at least based on sagittal reformat measurement. Left carotid system: Atheromatous wall thickening diffusely with multifocal calcified plaque. No flow limiting stenosis or ulceration. Vertebral arteries: Proximal subclavian atherosclerosis on both sides with 50% stenosis at the right subclavian origin based on sagittal reformats. Left dominant vertebral artery. Both vertebral arteries are patent to the basilar without beading or flow reducing stenosis. Skeleton: No acute or aggressive finding Other neck: Solitary enlarged lymph node in the low right jugular chain measuring 13 mm. Upper chest: Chest CT reported separately. Review of the MIP images confirms the above findings CTA HEAD FINDINGS Anterior circulation: The right ICA is smaller than the left in the setting of hypoplastic right A1 segment and possibly also due to under filling from the severe cervical stenosis. No branch occlusion, aneurysm, or generalized beading. There is diffuse atherosclerotic calcification with mild-to-moderate right siphon stenosis, quantification limited by vessel size and tortuosity with calcified plaque blooming. Posterior circulation: Left dominant vertebral artery. The vertebral and basilar arteries are widely patent. No PCA branch occlusion or proximal stenosis. Venous sinuses: Diffusely patent Anatomic variants: As above Review of the MIP images confirms the above findings IMPRESSION: 1. No emergent finding. 2. Severe proximal right ICA stenosis due to calcified plaque. 3. 50% stenosis at the proximal right subclavian artery. 4. Solitary enlarged right jugular chain lymph node, nonspecific in isolation. Recommend  follow-up after convalescence. 5. Chronic small vessel disease and chronic lacunar infarcts. Electronically Signed   By: Marja Kays  Watts M.D.   On: 2019/12/13 06:30   DG Chest Portable 1 View  Result Date: 2019/12/13 CLINICAL DATA:  Intubation EXAM: PORTABLE CHEST 1 VIEW COMPARISON:  Yesterday FINDINGS: Endotracheal tube with tip between the clavicular heads and carina. Streaky opacity at the lung bases with blunting at the right cardiophrenic sulcus. There is cephalized blood flow, airway cuffing, and vascular prominence. Cardiomegaly. Aortic valve replacement. IMPRESSION: 1. Endotracheal tube in expected position. 2. Vascular congestion/edema with atelectatic type opacity at the bases. Electronically Signed   By: Marnee Spring M.D.   On: 12/13/2019 04:18   DG Chest Portable 1 View  Result Date: 12/01/2019 CLINICAL DATA:  Multiple recent falls with shortness of breath. EXAM: PORTABLE CHEST 1 VIEW COMPARISON:  November 16, 2019 FINDINGS: Multiple sternal wires are present. Mild areas of atelectasis and/or infiltrate are seen within the bilateral lung bases. Small bilateral pleural effusions are noted. No pneumothorax is identified. The cardiac silhouette is markedly enlarged and unchanged in size. Marked severity calcification of the aortic arch is seen. Multilevel degenerative changes are noted throughout the thoracic spine. IMPRESSION: 1. Mild bibasilar atelectasis and/or infiltrate. 2. Small bilateral pleural effusions. 3. Stable cardiomegaly. Electronically Signed   By: Aram Candela M.D.   On: 11/26/2019 20:35   CT Angio Chest/Abd/Pel for Dissection W and/or Wo Contrast  Result Date: 12/13/19 CLINICAL DATA:  Chest and abdominal pain with left-sided neck pain, unresponsive EXAM: CT ANGIOGRAPHY CHEST, ABDOMEN AND PELVIS TECHNIQUE: Non-contrast CT of the chest was initially obtained. Multidetector CT imaging through the chest, abdomen and pelvis was performed using the standard protocol during  bolus administration of intravenous contrast. Multiplanar reconstructed images and MIPs were obtained and reviewed to evaluate the vascular anatomy. CONTRAST:  OMNIPAQUE IOHEXOL 350 MG/ML SOLN COMPARISON:  Chest x-ray from earlier in the same day. FINDINGS: CTA CHEST FINDINGS Cardiovascular: Initial noncontrast scan demonstrates significant atherosclerotic calcifications of the thoracic aorta. Contrast was subsequently administered and no significant aneurysmal dilatation or dissection is noted. The heart is enlarged in size. Enlargement of the central pulmonary artery is seen consistent with pulmonary hypertension. No sizable pulmonary embolus is noted although timing was performed for arterial evaluation. Heavy coronary calcifications are noted. No pericardial effusion is seen. Mediastinum/Nodes: Thoracic inlet is within normal limits. No hilar or mediastinal adenopathy is noted. The esophagus as visualized is within normal limits. Endotracheal tube is noted in satisfactory position. Lungs/Pleura: Left lung is well aerated without focal infiltrate. Mild changes of central pulmonary edema are noted. Small left pleural effusion is seen. More marked edematous changes are noted on the right with associated right lower lobe atelectasis/consolidation. Small right pleural effusion is seen. Calcified granulomas are noted in the right lower lobe. Musculoskeletal: Degenerative changes of the thoracic spine are noted. No compression deformity is seen. No acute rib abnormality is noted. Changes of prior median sternotomy are seen. Review of the MIP images confirms the above findings. CTA ABDOMEN AND PELVIS FINDINGS VASCULAR Aorta: Atherosclerotic calcifications of the abdominal aorta are noted. No aneurysmal dilatation or dissection is seen. Celiac: Patent without evidence of aneurysm, dissection, vasculitis or significant stenosis. SMA: Patent without evidence of aneurysm, dissection, vasculitis or significant  stenosis. Renals: Both renal arteries are patent without evidence of aneurysm, dissection, vasculitis, fibromuscular dysplasia or significant stenosis. 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 No dissection is seen. No focal stenosis is noted. Veins: Significant reflux into the hepatic venous system is  noted likely related to increased central venous pressure Review of the MIP images confirms the above findings. NON-VASCULAR Hepatobiliary: No focal liver abnormality is seen. Status post cholecystectomy. No biliary dilatation. Pancreas: Fatty infiltrated. Spleen: Normal in size without focal abnormality. Adrenals/Urinary Tract: Adrenal glands are within normal limits. Kidneys demonstrate normal enhancement pattern. No renal calculi or obstructive changes are seen. The bladder is decompressed by Foley catheter. Stomach/Bowel: The appendix is not well visualized although no inflammatory changes are seen. A right sided anterior abdominal wall hernia is noted. A loop of colon is noted within although no incarceration is noted. Small bowel and stomach are unremarkable. Lymphatic: No sizable lymphadenopathy is noted. Reproductive: Uterus is small consistent with postmenopausal state. Calcification is noted in the right ovary although no mass lesion is seen. Other: No abdominal wall hernia or abnormality. No abdominopelvic ascites Musculoskeletal: Degenerative changes of lumbar spine are noted. No acute bony abnormality is seen. Review of the MIP images confirms the above findings. IMPRESSION: Atherosclerotic calcifications of the aorta are noted without aneurysmal dilatation or dissection. No evidence of pulmonary emboli. Bilateral pleural effusions right greater than left with associated right lower lobe consolidation/atelectasis. Changes of mild pulmonary edema right greater than left with findings of increased central venous pressure and reflux into  the hepatic venous system. Right paramedian anterior abdominal wall hernia containing a loop of colon without obstructive change. Electronically Signed   By: Alcide Clever M.D.   On: 2019-12-14 06:25    EKG: I independently viewed the EKG done and my findings are as followed: Atrial flutter rate of 110 bpm with 3: 1 AV block  Assessment/Plan Present on Admission: . CAD (coronary artery disease) of artery bypass graft . A-fib (HCC) . COPD (chronic obstructive pulmonary disease) (HCC)  Principal Problem:   Acute on chronic respiratory failure with hypercapnia (HCC) Active Problems:   CAD (coronary artery disease) of artery bypass graft   A-fib (HCC)   COPD (chronic obstructive pulmonary disease) (HCC)   Generalized weakness   Recurrent falls   Right leg swelling   Leukocytosis   CKD (chronic kidney disease) stage 3, GFR 30-59 ml/min   GERD (gastroesophageal reflux disease)   Acute metabolic encephalopathy   Severe sepsis with septic shock (HCC)   Acute metabolic encephalopathy possibly secondary to acute on chronic respiratory failure with hypercapnia requiring IPPV Patient presents to the emergency department due to generalized weakness and recurrent falls, she incidentally became encephalopathic, developed agonal breaths with subsequent loss of pulse and breath, ACLS was started and patient had ROSC after 1 round of chest compressions IV epinephrine. Patient will be admitted to ICU She was then intubated and started on IV Levophed and IV propofol Right femoral central line was placed Post intubation chest x-ray showed endotracheal tube in excellent position, vascular congestion/edema with atelectatic type opacity at the bases CT negative chest, abdomen and pelvis showed no evidence of pulmonary emboli, but showed bilateral pleural effusions right greater than left with associated right lower lobe consolidation/atelectasis.  Mild pulmonary edema right greater than left was noted CT  angiography of head and neck showed severe proximal right ICA stenosis and 50% stenosis of the proximal right subclavian artery. ABG showed pH 7.084, PCO2 64.1, PO2 on room 59, bicarb 14.9 IV bicarb was started.  Patient was already empirically treated with IV ceftriaxone and azithromycin, we shall continue with IV cefepime and metronidazole. IV Protonix to prevent stress induced ulcer will be started Procalcitonin will be checked Repeat ABG and chest  x-ray in the morning  Severe sepsis with septic shock Patient presents with leukocytosis, tachycardia (meet SIRS criteria), she also has an endorgan damage due to respiratory failure thereby meeting sepsis criteria.  Patient presents with lactic acidosis and also require IV vasopressors to be able to maintain normal BP (thereby meeting septic shock criteria) Continue IV cefepime and metronidazole as described above  Generalized weakness and recurrent falls Continue fall precaution and neurochecks PT/OT will be consulted when patient comes off intubation  Leukocytosis possibly reactive vs infective Continue treatment as described above for severe sepsis with septic shock  Right leg swelling rule out DVT Ultrasound will be checked to rule out DVT  Atrial fibrillation on Eliquis Continue Eliquis per home regimen Resume home meds when med is updated  COPD DuoNebs every 4 hours as needed  GERD Continue IV Protonix as indicated above  Hypothyroidism  Continue IV levothyroxine 37.47mcg with plan to transition to oral dose equivalent when patient is extubated    DVT prophylaxis: Eliquis  Code Status: Full code  Family Communication: Daughter-in-law over the phone; family wanted patient to be full code.  Family called back to check on patient's condition and her CODE STATUS was changed to CPR.  Family wants patient to continue with mechanical ventilation at this time per RN medical record  Disposition Plan:  Patient is from:                         home Anticipated DC to:                   SNF  Anticipated DC date:               >3 days Anticipated DC barriers:           Patient is unstable for discharge at this time due to septic shock and respiratory failure requiring mechanical intubation.    Consults called: None  Admission status: Inpatient    Frankey Shown MD [facility]  If 7PM-7AM, please contact night-coverage [email or web address] Password [facility]  12/03/2019, 6:54 AM

## 2019-12-04 NOTE — ED Notes (Addendum)
Hospitalist to room at 0310. RN to room per Hospitalist request.  Upon entering room pt was cyanotic and unresponsive. EDP and respiratory called. CPR started 0319. Epi gave at 0321. Pulses found 0323. Roc gave at 0329. Pt intubated soon after. + color change.

## 2019-12-04 NOTE — ED Notes (Signed)
RN was called to pt's room by chaplain. Brie Eppard, RN and [employer], RN both confirmed by auscultation that pt no longer had any heart or lung sounds. Pt asystole on the monitor. Family at bedside. Comfort measures given. Family requests to use [facility] in [place], [place]. Contact person will be pt's daughter, Larita Fife (contact info in chart).

## 2019-12-04 NOTE — Progress Notes (Signed)
Pharmacy Antibiotic Note  Julie Burns is a 86 y.o. female admitted on 2019/12/06 with sepsis.  Pharmacy has been consulted for Vancomycin and Cefepime dosing.  Pt has allergy to PCN (hives/rash) - tolerated earlier dose of Rocephin.  Plan: Cefepime 2gm IV q24 Vancomycin 1750mg  IV now then 1000 mg IV Q 24 hrs.  Will f/u renal function, micro data, and pt's clinical condition Vanc levels prn   Height: 5' 6.5" (168.9 cm) Weight: 91 kg (200 lb 9.9 oz) IBW/kg (Calculated) : 60.45  Temp (24hrs), Avg:98.9 F (37.2 C), Min:98.9 F (37.2 C), Max:98.9 F (37.2 C)  Recent Labs  Lab December 06, 2019 2103 11/29/2019 0403  WBC 15.3* 16.5*  CREATININE 1.38* 1.75*  LATICACIDVEN 1.9 7.3*    Estimated Creatinine Clearance: 26 mL/min (A) (by C-G formula based on SCr of 1.75 mg/dL (H)).    Allergies  Allergen Reactions  . Statins Other (See Comments)    Caused muscle aches and joint pain.  11/20/19 [Naproxen Sodium] Other (See Comments)    Makes me fell "drunk-like"  . Bee Venom Swelling  . Codeine Hives  . Ibuprofen Other (See Comments)    "drunk-like" feeling  . Morphine And Related Other (See Comments)    Patient states she "felt weird".  . Norvasc [Amlodipine Besylate] Swelling  . Prednisone Swelling  . Sulfa Antibiotics     whelps  . Tylenol [Acetaminophen] Other (See Comments)    "drunk-like feeling"  . Valium [Diazepam]   . Zetia [Ezetimibe]   . Penicillins Hives and Rash    Has patient had a PCN reaction causing immediate rash, facial/tongue/throat swelling, SOB or lightheadedness with hypotension: yes Has patient had a PCN reaction causing severe rash involving mucus membranes or skin necrosis: Unknown Has patient had a PCN reaction that required hospitalization: No Has patient had a PCN reaction occurring within the last 10 years: No If all of the above answers are "NO", then may proceed with Cephalosporin use.     Antimicrobials this admission: 9/15 Vanc >>  9/15 Cefepime  >>  9/15 Ceftriaxone/Azith x 1  Microbiology results: 9/14 BCx:  9/14 UCx:    Thank you for allowing pharmacy to be a part of this patient's care.  10/14, PharmD, BCPS Please see amion for complete clinical pharmacist phone list 11/28/2019 5:30 AM

## 2019-12-04 NOTE — ED Notes (Signed)
DNR sticker placed on pt's armband per MD order.

## 2019-12-04 NOTE — Consult Note (Signed)
Consultation Note Date: 2019-12-17   Patient Name: Julie Burns  DOB: 10-Jan-1931  MRN: [id number]  Age / Sex: 86 y.o., female  PCP: Julie Blitz, MD Referring Physician: Murlean Iba, MD  Reason for Consultation: Establishing goals of care  HPI/Patient Profile: 86 y.o. female  with past medical history of type 2 DM, COPD on chronic oxygen, CKD, CAD, atrial fibrillation, NSTEMI admitted on 11/10/2019 with neck pain and respiratory distress. Patient was in ED evening of 9/13 after falling out of bed and injuring head, neck, right ankle and chest wall. Imaging with no acute injuries therefore patient was discharged home. Returned to ED with persistent neck pain. Hospital admission for severe sepsis with septic shock, acute metabolic encephalopathy secondary to a/c respiratory failure. CT chest revealed bilateral pleural effusions and RLL consolidation/atelectasis. CT angio head/neck showed severe proximal right ICA stenosis and 50% stenosis of proximal right subclavia artery. In ED, developed agonal breaths with subsequent loss of pulse and breath, ACLS was started and patient had ROSC after 1 round of chest compressions IV epinephrine. Patient remains critically ill. Palliative medicine consultation for goals of care/terminal care.   Clinical Assessment and Goals of Care:  I have reviewed medical records, discussed with care team, and patient assessment completed. Julie Burns is intubated and sedated.   Met with family outside ED entrance including daughter Julie Burns), niece Julie Burns), nephew Julie Burns) and son Julie Burns) on speaker phone. Julie Burns is traveling from [place] and will not arrive until after midnight.   I introduced Palliative Medicine as specialized medical care for people living with serious illness. It focuses on providing relief from the symptoms and stress of a serious illness.   Family shares that [name] was in poor  health prior to admission with multiple chronic conditions and poor quality of life.   Discussed events leading up to admission and course of hospitalization including diagnoses, interventions, plan of care. Frankly and compassionately shared poor prognosis due to multi-organ failure and severe septic shock.   I attempted to elicit values and goals of care important to the family. No documented POA. Julie Burns and Julie Burns make decisions together. Both Julie Burns and Julie Burns share that they are ready to "let her go" and do not wish for her to suffer any longer. "She has suffered enough." Although Julie Burns will not be here till after midnight, he does not wish to prolong this process for his mother. Julie Burns and Julie Burns are ready for transition to comfort measures only, understanding poor prognosis.   Discussed compassionate extubation and transition to comfort measures, explaining that interventions not aimed at comfort will be discontinued. Discussed emphasis on symptom management medications to ensure comfort and relief from suffering. Discussed unrestricted visitor access. Discussed chaplain support. Prepared them for prognosis of minutes-hours-days when extubated. Confirmed decision for DNR code status.   Updated RN and Dr. Wynetta Emery on plan for extubation and transition to comfort. Comfort meds ordered. Waiting on pharmacy to send dilaudid gtt before extubation. PMT contact information given.   **F/u post-extubation with family. Julie Burns at  bedside offering prayer. Patient is extubated and appears to be actively dying. She is comfortable on dilaudid gtt. Agonal respirations and cyanotic. Emotional/spiritual support provided to family at bedside. Discussed with RN.    SUMMARY OF RECOMMENDATIONS    No documented living will/POA. Discussed GOC with patient's two living children Julie Burns and Julie Burns).  Family understands diagnoses and poor prognosis. They wish for transition to comfort measures only and ready this afternoon  as they do not wish to prolong her suffering.   Compassionate extubation to comfort measures only.  Symptom management--see below.  Spiritual care consult  Unrestricted visitor access.  Anticipate hospital death.  Code Status/Advance Care Planning:  DNR  Symptom Management:   RN to initiate dilaudid 26m/hr continuous infusion for comfort  RN may bolus via infusion dilaudid 0.558mq3030mprn pain/dyspnea/air hunger/tachypnea  Benadryl 63m63m q6h prn itching/hives  Robinul 0.2mg 38mq4h prn secretions  Ativan 1mg I44m4h prn anxiety  Palliative Prophylaxis:   Aspiration, Delirium Protocol, Frequent Pain Assessment, Oral Care and Turn Reposition  Additional Recommendations (Limitations, Scope, Preferences):  Full Comfort Care  Psycho-social/Spiritual:   Desire for further Chaplaincy support: yes  Additional Recommendations: Caregiving  Support/Resources and Compassionate Wean Education  Prognosis:   Grim prognosis likely hours-days once compassionately extubated to comfort  Discharge Planning: Anticipated Hospital Death      Primary Diagnoses: Present on Admission: . CAD (coronary artery disease) of artery bypass graft . A-fib (HCC) .[place] leg swelling . Leukocytosis . CKD (chronic kidney disease) stage 3, GFR 30-59 ml/min . Acute on chronic respiratory failure with hypercapnia (HCC) .[place] VillagePD (chronic obstructive pulmonary disease) (HCC) .[place] (gastroesophageal reflux disease) . Acute metabolic encephalopathy . Acute respiratory failure with hypercapnia (HCC) .[place] (do not resuscitate)   I have reviewed the medical record, interviewed the patient and family, and examined the patient. The following aspects are pertinent.  Past Medical History:  Diagnosis Date  . Age-related osteoporosis without current pathological fracture   . Anxiety disorder   . Atrial fibrillation (HCC)  [place] (coronary artery disease)    12/2013-DES to RCA due to NSTEMI- staged  PCI to LAD and CX (not ideal for CABG; Echocardiogram 01/16/10 EF 55%, severe LVH; Moderate AS; mild ot moderate TR  . Chronic kidney disease   . Chronic respiratory failure with hypoxia (HCC)  [place]D (chronic obstructive pulmonary disease) (HCC)  [place]Degenerative myopia with macular hole   . Diabetes (HCC)  [place] 2  . Hyperlipidemia   . Hypothyroidism   . Irritable bowel syndrome without diarrhea   . NSTEMI (non-ST elevated myocardial infarction) (HCC)  [place] 3/05; s/p PTCA with stenting of left circumflex coronary artery 3/05; totally occluded small vessel OM  . Occlusion and stenosis of right carotid artery    Carotid Duplex 7/13 moderate disease (<70%) of the right internal carotid arteries, this is a high bifurcation, s/p left CEA without significant stenosis.  . Polyp of colon   . Presence of prosthetic heart valve    S/P aortic valve replacement 04/29/2008   . PVD (peripheral vascular disease) (HCC)  [place]ic[place] syndrome (HCC)  [place]roke (cerebrum) (HCC)  [place]Unspecified abdominal hernia without obstruction or gangrene   . Vitamin D deficiency    Social History   Socioeconomic History  . Marital status: Divorced    Spouse name: Not on file  . Number of children: 6  . Years of education: Not  on file  . Highest education level: GED or equivalent  Occupational History  . Occupation: retired  Tobacco Use  . Smoking status: Former Smoker    Packs/day: 3.00    Types: Cigarettes    Quit date: 10/23/1976    Years since quitting: 43.0  . Smokeless tobacco: Never Used  Vaping Use  . Vaping Use: Never used  Substance and Sexual Activity  . Alcohol use: No  . Drug use: No  . Sexual activity: Not Currently  Other Topics Concern  . Not on file  Social History Narrative   Pt lives at home alone but has a caregiver come in 7 days a week and talks with her daughter daily.   Social Determinants of Health   Financial Resource Strain: Low Risk   . Difficulty of Paying Living  Expenses: Not hard at all  Food Insecurity: No Food Insecurity  . Worried About Charity fundraiser in the Last Year: Never true  . Ran Out of Food in the Last Year: Never true  Transportation Needs: No Transportation Needs  . Lack of Transportation (Medical): No  . Lack of Transportation (Non-Medical): No  Physical Activity: Inactive  . Days of Exercise per Week: 0 days  . Minutes of Exercise per Session: 0 min  Stress: Stress Concern Present  . Feeling of Stress : To some extent  Social Connections: Socially Isolated  . Frequency of Communication with Friends and Family: More than three times a week  . Frequency of Social Gatherings with Friends and Family: Three times a week  . Attends Religious Services: Never  . Active Member of Clubs or Organizations: No  . Attends Archivist Meetings: Never  . Marital Status: Divorced   Family History  Problem Relation Age of Onset  . Hypertension Mother   . CAD Mother   . Hypertension Father   . CAD Father   . Hypertension Sister   . CAD Sister   . Hypertension Brother   . CAD Brother   . Diabetes Brother   . Hypertension Brother   . Diabetes Brother   . COPD Son    Scheduled Meds: Continuous Infusions: . HYDROmorphone 1 mg/hr (2019-12-08 1449)   PRN Meds:.diphenhydrAMINE, glycopyrrolate, HYDROmorphone, ipratropium-albuterol, LORazepam Medications Prior to Admission:  Prior to Admission medications   Medication Sig Start Date End Date Taking? Authorizing Provider  alendronate (FOSAMAX) 70 MG tablet Take 70 mg by mouth once a week. Take with a full glass of water on an empty stomach. Take on Sunday's.    [provider]  apixaban (ELIQUIS) 5 MG TABS tablet Take 5 mg by mouth 2 (two) times daily.    [provider]  benzonatate (TESSALON) 200 MG capsule Take 200 mg by mouth 3 (three) times daily as needed for cough.    [provider]  budesonide (PULMICORT) 0.5 MG/2ML nebulizer solution Take 2  mLs (0.5 mg total) by nebulization 2 (two) times daily. 12/15/18   Claretta Fraise, MD  Cholecalciferol (VITAMIN D3) 5000 units CAPS Take 1 capsule by mouth daily.    [provider]  diltiazem (CARDIZEM CD) 240 MG 24 hr capsule Take 1 capsule (240 mg total) by mouth daily. 08/17/19 11/15/19  Verta Ellen., NP  Ipratropium-Albuterol (COMBIVENT RESPIMAT) 20-100 MCG/ACT AERS respimat Inhale 1 puff into the lungs every 4 (four) hours as needed for wheezing or shortness of breath.  05/10/17   [provider]  ipratropium-albuterol (DUONEB) 0.5-2.5 (3) MG/3ML SOLN Take  3 mLs by nebulization every 6 (six) hours as needed (shortness of breath).     [provider]  isosorbide mononitrate (IMDUR) 60 MG 24 hr tablet Take 1 tablet (60 mg total) by mouth daily. 06/11/19 09/09/19  Roxan Hockey, MD  levothyroxine (SYNTHROID, LEVOTHROID) 75 MCG tablet Take 75 mcg by mouth See admin instructions. Take 75 mcg every Sun, Tues, Wed, Fri, and Sat.  Take 112.5 mcg every Mon and Thurs.    [provider]  LORazepam (ATIVAN) 0.5 MG tablet Take 0.5 mg by mouth 2 (two) times daily as needed for anxiety or sleep.     [provider]  metoprolol succinate (TOPROL-XL) 100 MG 24 hr tablet Take 1 tablet (100 mg total) by mouth in the morning and at bedtime. Take with or immediately following a meal. 08/17/19 11/15/19  Verta Ellen., NP  Multiple Vitamins-Minerals (ICAPS AREDS FORMULA PO) Take 1 capsule by mouth 2 (two) times daily.     [provider]  nitroGLYCERIN (NITROSTAT) 0.4 MG SL tablet PLACE 1 TABLET UNDER THE TONGUE AT ONSET OF CHEST PAIN EVERY 5 MINTUES UP TO 3 TIMES AS NEEDED Patient taking differently: Place 0.4 mg under the tongue every 5 (five) minutes as needed.  11/20/18   Minus Breeding, MD  nystatin-triamcinolone ointment [facility]) Apply 1 application topically 2 (two) times daily as needed (rash).     [provider]  ondansetron (ZOFRAN-ODT) 4  MG disintegrating tablet Take 1 tablet (4 mg total) by mouth every 8 (eight) hours as needed for nausea or vomiting. 08/19/19   Mahala Menghini, PA-C  pantoprazole (PROTONIX) 40 MG tablet Take 1 tablet (40 mg total) by mouth daily. 06/11/19   Roxan Hockey, MD  polyethylene glycol (MIRALAX / GLYCOLAX) packet Take 17 g by mouth daily.    [provider]  Tiotropium Bromide-Olodaterol (STIOLTO RESPIMAT) 2.5-2.5 MCG/ACT AERS Inhale 2 puffs into the lungs daily. 05/20/19   Julian Hy, DO  torsemide (DEMADEX) 20 MG tablet Take 2 tablets (40 mg total) by mouth daily. 06/12/19   Roxan Hockey, MD  traMADol (ULTRAM) 50 MG tablet Take 50 mg by mouth every 6 (six) hours as needed for moderate pain.     [provider]   Allergies  Allergen Reactions  . Statins Other (See Comments)    Caused muscle aches and joint pain.  Tori Milks [Naproxen Sodium] Other (See Comments)    Makes me fell "drunk-like"  . Bee Venom Swelling  . Codeine Hives  . Ibuprofen Other (See Comments)    "drunk-like" feeling  . Morphine And Related Other (See Comments)    Patient states she "felt weird".  . Norvasc [Amlodipine Besylate] Swelling  . Prednisone Swelling  . Sulfa Antibiotics     whelps  . Tylenol [Acetaminophen] Other (See Comments)    "drunk-like feeling"  . Valium [Diazepam]   . Zetia [Ezetimibe]   . Penicillins Hives and Rash    Has patient had a PCN reaction causing immediate rash, facial/tongue/throat swelling, SOB or lightheadedness with hypotension: yes Has patient had a PCN reaction causing severe rash involving mucus membranes or skin necrosis: Unknown Has patient had a PCN reaction that required hospitalization: No Has patient had a PCN reaction occurring within the last 10 years: No If all of the above answers are "NO", then may proceed with Cephalosporin use.    Review of Systems  Unable to perform ROS: Acuity of condition   Physical Exam Vitals and nursing note  reviewed.    Constitutional:      Interventions: She is sedated and intubated.  HENT:     Head: Normocephalic and atraumatic.  Cardiovascular:     Rate and Rhythm: Rhythm irregularly irregular.  Pulmonary:     Effort: No tachypnea, accessory muscle usage or respiratory distress. She is intubated.     Breath sounds: Decreased breath sounds present.  Skin:    General: Skin is cool.     Comments: Cyanotic toes  Neurological:     Comments: Intubated/sedated     Vital Signs: BP 119/76   Pulse (!) 120   Temp 98.9 F (37.2 C) (Oral)   Resp 20   Ht 5' 6.5" (1.689 m)   Wt 91 kg   SpO2 98%   BMI 31.90 kg/m  Pain Scale: CPOT   Pain Score: 10-Worst pain ever   SpO2: SpO2: 98 % O2 Device:SpO2: 98 % O2 Flow Rate: .   IO: Intake/output summary:   Intake/Output Summary (Last 24 hours) at 11-25-2019 1515 Last data filed at 11/25/19 1432 Gross per 24 hour  Intake 3711.37 ml  Output --  Net 3711.37 ml    LBM:   Baseline Weight: Weight: 91 kg Most recent weight: Weight: 91 kg     Palliative Assessment/Data: PPS 10%   Flowsheet Rows     Most Recent Value  Intake Tab  Referral Department Hospitalist  Unit at Time of Referral ER  Palliative Care Primary Diagnosis Sepsis/Infectious Disease  Palliative Care Type Return patient Palliative Care  Reason for referral Clarify Goals of Care  Date first seen by Palliative Care 11/25/2019  Clinical Assessment  Palliative Performance Scale Score 10%  Psychosocial & Spiritual Assessment  Palliative Care Outcomes  Patient/Family meeting held? Yes  Who was at the meeting? son, daughter, niece, nephew  Palliative Care Outcomes Improved non-pain symptom therapy, Improved pain interventions, Clarified goals of care, Provided end of life care assistance, Provided psychosocial or spiritual support, Changed to focus on comfort, ACP counseling assistance      Time In: 1230-1340, [phone] Time Total: 80 Greater than 50%  of this time was spent  counseling and coordinating care related to the above assessment and plan.  Signed by:  Ihor Dow, DNP, FNP-C Palliative Medicine Team  Phone: [phone] Fax: [phone]  Please contact Palliative Medicine Team phone at [phone] for questions and concerns.  For individual provider: See Shea Evans

## 2019-12-04 NOTE — ED Provider Notes (Signed)
Physical Exam  BP 97/60   Pulse (!) 101   Temp 98.9 F (37.2 C) (Oral)   Resp 20   Ht 5' 6.5" (1.689 m)   Wt 91 kg   SpO2 95%   BMI 31.90 kg/m   Physical Exam Constitutional:      General: She is in acute distress.     Appearance: She is ill-appearing.  Eyes:     Comments: Pinpoint pupils  Cardiovascular:     Comments: Initially with thready radial pulses Pulmonary:     Effort: Respiratory distress present.     Comments: Agonal breathing Skin:    Coloration: Skin is pale (and cyanotic. cool to touch.).  Neurological:     Comments: unresponsive     ED Course/Procedures     Procedure Name: Intubation Date/Time: 2019/12/17 4:17 AM Performed by: Marily Memos, MD Pre-anesthesia Checklist: Patient identified, Patient being monitored, Emergency Drugs available, Timeout performed and Suction available Oxygen Delivery Method: Non-rebreather mask Preoxygenation: Pre-oxygenation with 100% oxygen Induction Type: Rapid sequence Ventilation: Mask ventilation without difficulty Laryngoscope Size: Glidescope and 4 Grade View: Grade I Tube size: 7.5 mm Number of attempts: 1 Airway Equipment and Method: Rigid stylet Placement Confirmation: ETT inserted through vocal cords under direct vision,  CO2 detector and Breath sounds checked- equal and bilateral Secured at: 23 cm Tube secured with: ETT holder Dental Injury: Teeth and Oropharynx as per pre-operative assessment  Future Recommendations: Recommend- induction with short-acting agent, and alternative techniques readily available    .Critical Care Performed by: Marily Memos, MD Authorized by: Marily Memos, MD   Critical care provider statement:    Critical care time (minutes):  45   Critical care was necessary to treat or prevent imminent or life-threatening deterioration of the following conditions:  Shock, cardiac failure, circulatory failure and CNS failure or compromise   Critical care was time spent personally by  me on the following activities:  Discussions with consultants, evaluation of patient's response to treatment, examination of patient, ordering and performing treatments and interventions, ordering and review of laboratory studies, ordering and review of radiographic studies, pulse oximetry, re-evaluation of patient's condition, obtaining history from patient or surrogate and review of old charts CPR  Date/Time: 12-17-2019 4:18 AM Performed by: Marily Memos, MD Authorized by: Marily Memos, MD  CPR Procedure Details:      Amount of time prior to administration of ACLS/BLS (minutes):  0   ACLS/BLS initiated by EMS: No     CPR/ACLS performed in the ED: Yes     Duration of CPR (minutes):  7   Outcome: ROSC obtained    CPR performed via ACLS guidelines under my direct supervision.  See RN documentation for details including defibrillator use, medications, doses and timing. Marthenia Rolling Line  Date/Time: 12-17-2019 4:18 AM Performed by: Marily Memos, MD Authorized by: Marily Memos, MD   Consent:    Consent obtained:  Verbal   Risks discussed:  Incorrect placement, infection, bleeding, arterial puncture, nerve damage and pneumothorax   Alternatives discussed:  No treatment, delayed treatment, alternative treatment and observation Pre-procedure details:    Hand hygiene: Hand hygiene performed prior to insertion     Sterile barrier technique: All elements of maximal sterile technique followed     Skin preparation:  2% chlorhexidine   Skin preparation agent: Skin preparation agent completely dried prior to procedure   Procedure details:    Location:  R femoral   Site selection rationale:  Cpr, broken ribs   Patient position:  Flat  Procedural supplies:  Triple lumen   Catheter size:  7 Fr   Ultrasound guidance: yes     Sterile ultrasound techniques: Sterile gel and sterile probe covers were used     Number of attempts:  1   Successful placement: yes   Post-procedure details:     Post-procedure:  Dressing applied and line sutured   Assessment:  Blood return through all ports, free fluid flow and no pneumothorax on x-ray   Patient tolerance of procedure:  Tolerated well, no immediate complications    MDM   Patient had previously been admitted and apparently been doing well.  Please see previous provider note for details of her hospital stay up until that point.  Patient apparently had an abrupt change in mental status.  On my evaluation patient had agonal breathing, pallor, cyanotic lips.  Not able to pick up pulse ox.  She had an electrical rhythm and hypotension.  Her blood glucose was 131.  Patient subsequently lost pulses.  CPR was performed, epinephrine was given, patient was intubated and started on Levophed.  A central line was placed as per procedure notes.  Hospitalist discussed with the son and patient is a full code at this time.  There is unclear what caused her arrest.  Her blood gas showed acidosis but not significantly hypercarbic.  Consider possible opiate overdose however she did not receive any prior to this happening and is not prescribed any at home.  She came in for neck pain and had some falls recently so we will go and do some CT scans to see if there is any traumatic cause however to be tough to differentiate post CPR injuries versus injuries from a fall.  Patient afib rvr. H/o same. Bolus dilt given, improved.   Per nursing conversation with family, will be made DNR.   Had discussed with hospitalist regarding admission here vs [facility] with [facility] vs pulm/[facility] and said to not call [facility], they would admit here. Pending admit to icu with hospitalist.    Marily Memos, MD 2019/11/23 [phone]

## 2019-12-04 NOTE — ED Notes (Signed)
Date and time results received: 11/15/2019 0402  Test: pH Critical Value: 7.082  Test: pCO2 Critical Value: 65.2  Name of Provider Notified: Mesner, MD  Orders Received? Or Actions Taken?: acknowledged

## 2019-12-04 NOTE — Death Summary Note (Signed)
DEATH SUMMARY   Patient Details  Name: Julie Burns MRN: [id number] DOB: November 26, 1930  Admission/Discharge Information   Admit Date:  2019-12-11  Date of Death:   2019-12-12   Time of Death:   1544-04-30   Length of Stay: 0  Referring Physician: Monico Blitz, MD   Reason(s) for Hospitalization  ADMISSION HPI: Julie Burns is a 86 y.o. female with medical history significant for type 2 diabetes, COPD on chronic home O2, chronic kidney disease, CAD, atrial fibrillation, history of non-STEMI who presents to the emergency department due to neck pain.  History cannot be obtained from patient due to patient being in acute respiratory distress.  History was obtained from ED PA, per report, patient was seen in the ED in the evening of 9/13 after falling out of bed x2 injuring her head, neck, right ankle and left chest wall.  Imaging were done at that time and there were no acute injuries, so patient was discharged home, she returned last night due to persistent left-sided neck pain which was aggravated with movement and with no elevating factor.  Patient was also reported to complain of increased urinary frequency, fatigue and drowsiness. Patient lives alone, but has caregiver during the daytime, patient was sent to the ED by family because they were concerned about her staying alone due to her current condition.  Patient was not sure she would be able to take care of herself due to current weakness.  ED Course:  In the emergency department, vital signs were stable.  Work-up in the ED showed leukocytosis, hyponatremia, BUN to creatinine 36/1.38, urinalysis was unimpressive for UTI, lactic acid was 1.9> 7.3.  Albumin 3.3.  Chest x-ray showed mild bibasilar atelectasis and/or infiltrate and small bilateral pleural effusions with stable cardiomegaly.  She was empirically's treated with IV ceftriaxone and azithromycin, IV Cardizem and 2 L of IV LR were also given.  Hospitalist was asked to admit patient for  further evaluation management. At bedside, patient was noted to be in respiratory distress, she was noted to be pale with cyanotic lips and was having agonal breaths.  Breathing support was able to be provided when patient lost pulse and ACLS was started.  Patient had ROSC after 1 round of chest compressions and IV epinephrine, she was intubated and was sedated with propofol.  IV Levophed was started.  Diagnoses  Preliminary cause of death:  Secondary Diagnoses (including complications and co-morbidities):  Principal Problem:   Acute on chronic respiratory failure with hypercapnia (HCC) Active Problems:   CAD (coronary artery disease) of artery bypass graft   A-fib (HCC)   COPD (chronic obstructive pulmonary disease) (HCC)   Terminal care   Generalized weakness   Recurrent falls   Right leg swelling   Leukocytosis   CKD (chronic kidney disease) stage 3, GFR 30-59 ml/min   GERD (gastroesophageal reflux disease)   Acute metabolic encephalopathy   Septic shock (HCC)   Acute respiratory failure with hypercapnia ([place])   DNR (do not resuscitate)  Brief Hospital Course (including significant findings, care, treatment, and services provided and events leading to death)  Julie Burns is a 86 y.o. year old female  Palliative care notes from Julie Burns HPI/Patient Profile: 86 y.o. female  with past medical history of type 2 DM, COPD on chronic oxygen, CKD, CAD, atrial fibrillation, NSTEMI admitted on 12-11-2019 with neck pain and respiratory distress. Patient was in ED evening of 9/13 after falling out of bed and injuring head, neck, right ankle  and chest wall. Imaging with no acute injuries therefore patient was discharged home. Returned to ED with persistent neck pain. Hospital admission for severe sepsis with septic shock, acute metabolic encephalopathy secondary to a/c respiratory failure. CT chest revealed bilateral pleural effusions and RLL consolidation/atelectasis. CT angio head/neck showed  severe proximal right ICA stenosis and 50% stenosis of proximal right subclavia artery. In ED, developed agonal breaths with subsequent loss of pulse and breath, ACLS was started and patient had ROSC after 1 round of chest compressions IV epinephrine. Patient remains critically ill. Palliative medicine consultation for goals of care/terminal care.   Clinical Assessment and Goals of Care:  I have reviewed medical records, discussed with care team, and patient assessment completed. Julie Burns is intubated and sedated.   Met with family outside ED entrance including daughter Julie Burns), niece Julie Burns), nephew Julie Burns) and son Julie Burns) on speaker phone. Julie Burns is traveling from [place] and will not arrive until after midnight.   I introduced Palliative Medicine as specialized medical care for people living with serious illness. It focuses on providing relief from the symptoms and stress of a serious illness.   Family shares that Julie Burns was in poor health prior to admission with multiple chronic conditions and poor quality of life.   Discussed events leading up to admission and course of hospitalization including diagnoses, interventions, plan of care. Frankly and compassionately shared poor prognosis due to multi-organ failure and severe septic shock.   I attempted to elicit values and goals of care important to the family. No documented POA. Julie Burns and Julie Burns make decisions together. Both Julie Burns and Julie Burns share that they are ready to "let her go" and do not wish for her to suffer any longer. "She has suffered enough." Although Julie Burns will not be here till after midnight, he does not wish to prolong this process for his mother. Julie Burns and Julie Burns are ready for transition to comfort measures only, understanding poor prognosis.   Discussed compassionate extubation and transition to comfort measures, explaining that interventions not aimed at comfort will be discontinued. Discussed emphasis on symptom management medications to  ensure comfort and relief from suffering. Discussed unrestricted visitor access. Discussed chaplain support. Prepared them for prognosis of minutes-hours-days when extubated. Confirmed decision for DNR code status.   Updated RN and Dr. Wynetta Emery on plan for extubation and transition to comfort. Comfort meds ordered. Waiting on pharmacy to send dilaudid gtt before extubation. PMT contact information given.   **F/u post-extubation with family. Alfonso Patten at bedside offering prayer. Patient is extubated and appears to be actively dying. She is comfortable on dilaudid gtt. Agonal respirations and cyanotic. Emotional/spiritual support provided to family at bedside. Discussed with RN.   SUMMARY OF RECOMMENDATIONS    No documented living will/POA. Discussed GOC with patient's two living children Julie Burns and [place]).  Family understands diagnoses and poor prognosis. They wish for transition to comfort measures only and ready this afternoon as they do not wish to prolong her suffering.   Compassionate extubation to comfort measures only.  Symptom management--see below.  Spiritual care consult  Unrestricted visitor access.  Anticipate hospital death.  Code Status/Advance Care Planning:  DNR  Symptom Management:   RN to initiate dilaudid 16m/hr continuous infusion for comfort  RN may bolus via infusion dilaudid [id number] pain/dyspnea/air hunger/tachypnea  Benadryl 97m22m q6h prn itching/hives  Robinul 0.2mg 53mq4h prn secretions  Ativan 1mg I39m4h prn anxiety  Palliative Prophylaxis:   Aspiration, Delirium Protocol, Frequent Pain Assessment, Oral Care and  Turn Reposition  Additional Recommendations (Limitations, Scope, Preferences):  Full Comfort Care  Psycho-social/Spiritual:   Desire for further Chaplaincy support: yes  Additional Recommendations: Caregiving  Support/Resources and Compassionate Wean Education  Prognosis:   Grim prognosis likely  hours-days once compassionately extubated to comfort  Discharge Planning: Anticipated Hospital Death   Pertinent Labs and Studies  Significant Diagnostic Studies CT Angio Head W or Wo Contrast  Result Date: Dec 03, 2019 CLINICAL DATA:  Left-sided neck pain EXAM: CT ANGIOGRAPHY HEAD AND NECK TECHNIQUE: Multidetector CT imaging of the head and neck was performed using the standard protocol during bolus administration of intravenous contrast. Multiplanar CT image reconstructions and MIPs were obtained to evaluate the vascular anatomy. Carotid stenosis measurements (when applicable) are obtained utilizing NASCET criteria, using the distal internal carotid diameter as the denominator. CONTRAST:  178m OMNIPAQUE IOHEXOL 350 MG/ML SOLN COMPARISON:  Head CT from 2 days ago FINDINGS: CT HEAD FINDINGS Brain: No evidence of acute infarction, hemorrhage, hydrocephalus, extra-axial collection or mass lesion/mass effect. Confluent chronic small vessel ischemia in the cerebral white matter. Chronic lacune at the left corona radiata and infra medial thalamus. Vascular: See below Skull: No acute finding.  Hyperostosis interna. Sinuses: Clear Orbits: No acute finding Review of the MIP images confirms the above findings CTA NECK FINDINGS Aortic arch: Atheromatous plaque with 2 vessel branching. Reference dedicated chest CTA performed at the same time. Right carotid system: Diffuse atheromatous wall thickening of the common carotid with calcified plaque at the bifurcation. ICA origin stenosis measures 75% at least based on sagittal reformat measurement. Left carotid system: Atheromatous wall thickening diffusely with multifocal calcified plaque. No flow limiting stenosis or ulceration. Vertebral arteries: Proximal subclavian atherosclerosis on both sides with 50% stenosis at the right subclavian origin based on sagittal reformats. Left dominant vertebral artery. Both vertebral arteries are patent to the basilar without beading  or flow reducing stenosis. Skeleton: No acute or aggressive finding Other neck: Solitary enlarged lymph node in the low right jugular chain measuring 13 mm. Upper chest: Chest CT reported separately. Review of the MIP images confirms the above findings CTA HEAD FINDINGS Anterior circulation: The right ICA is smaller than the left in the setting of hypoplastic right A1 segment and possibly also due to under filling from the severe cervical stenosis. No branch occlusion, aneurysm, or generalized beading. There is diffuse atherosclerotic calcification with mild-to-moderate right siphon stenosis, quantification limited by vessel size and tortuosity with calcified plaque blooming. Posterior circulation: Left dominant vertebral artery. The vertebral and basilar arteries are widely patent. No PCA branch occlusion or proximal stenosis. Venous sinuses: Diffusely patent Anatomic variants: As above Review of the MIP images confirms the above findings IMPRESSION: 1. No emergent finding. 2. Severe proximal right ICA stenosis due to calcified plaque. 3. 50% stenosis at the proximal right subclavian artery. 4. Solitary enlarged right jugular chain lymph node, nonspecific in isolation. Recommend follow-up after convalescence. 5. Chronic small vessel disease and chronic lacunar infarcts. Electronically Signed   By: JMonte FantasiaM.D.   On: [id number]   DG Ribs Unilateral W/Chest Left  Result Date: 11/16/2019 CLINICAL DATA:  [place]. Left chest pain. EXAM: LEFT RIBS AND CHEST - 3+ VIEW COMPARISON:  Chest x-ray 09/22/2019 FINDINGS: The heart is enlarged but stable. Stable prominent mediastinal and hilar contours. Stable bronchitic type interstitial changes and streaky basilar scarring. No definite infiltrates or effusions. No pneumothorax. Dedicated views of the left ribs do not demonstrate any definite acute rib fractures. IMPRESSION: Chronic lung  changes.  No acute pulmonary findings. No definite acute left-sided  rib fractures. Electronically Signed   By: Marijo Sanes M.D.   On: 11/16/2019 05:31   DG Ankle Complete Right  Result Date: 11/16/2019 CLINICAL DATA:  [place]. Ankle pain. EXAM: RIGHT ANKLE - COMPLETE 3+ VIEW COMPARISON:  None. FINDINGS: The ankle mortise is maintained. Mild degenerative changes but no acute ankle fracture. The subtalar joints are maintained. No hindfoot fractures. Calcaneal spurring changes noted. IMPRESSION: Mild degenerative changes but no acute fracture. Electronically Signed   By: Marijo Sanes M.D.   On: 11/16/2019 05:32   CT Head Wo Contrast  Result Date: 11/16/2019 CLINICAL DATA:  Facial trauma EXAM: CT HEAD WITHOUT CONTRAST CT MAXILLOFACIAL WITHOUT CONTRAST CT CERVICAL SPINE WITHOUT CONTRAST TECHNIQUE: Multidetector CT imaging of the head, cervical spine, and maxillofacial structures were performed using the standard protocol without intravenous contrast. Multiplanar CT image reconstructions of the cervical spine and maxillofacial structures were also generated. COMPARISON:  10/30/2019 head CT FINDINGS: CT HEAD FINDINGS Brain: No evidence of acute infarction, hemorrhage, hydrocephalus, extra-axial collection or mass lesion/mass effect. Patchy small remote left inferior cerebellar infarcts. Chronic small vessel ischemia that is confluent in the white matter. Chronic lacune at the medial left thalamus and left corona radiata. Vascular: Atherosclerotic calcification Skull: Negative for fracture CT MAXILLOFACIAL FINDINGS Osseous: Negative for fracture or mandibular dislocation. Edentulous with alveolar ridge atrophy. Orbits: Bilateral cataract resection. Sinuses: Clear.  No hemosinus. Soft tissues: No discrete hematoma or opaque foreign body. CT CERVICAL SPINE FINDINGS Alignment: No traumatic malalignment Skull base and vertebrae: No acute fracture Soft tissues and spinal canal: No prevertebral fluid or swelling. No visible canal hematoma. Disc levels: Facet osteoarthritis with bulky  spurring asymmetric to the left. Upper chest: Negative IMPRESSION: No evidence of intracranial injury. Negative for facial or cervical spine fracture. Electronically Signed   By: Monte Fantasia M.D.   On: 11/16/2019 04:32   CT Angio Neck W and/or Wo Contrast  Result Date: 2019-11-20 CLINICAL DATA:  Left-sided neck pain EXAM: CT ANGIOGRAPHY HEAD AND NECK TECHNIQUE: Multidetector CT imaging of the head and neck was performed using the standard protocol during bolus administration of intravenous contrast. Multiplanar CT image reconstructions and MIPs were obtained to evaluate the vascular anatomy. Carotid stenosis measurements (when applicable) are obtained utilizing NASCET criteria, using the distal internal carotid diameter as the denominator. CONTRAST:  147m OMNIPAQUE IOHEXOL 350 MG/ML SOLN COMPARISON:  Head CT from 2 days ago FINDINGS: CT HEAD FINDINGS Brain: No evidence of acute infarction, hemorrhage, hydrocephalus, extra-axial collection or mass lesion/mass effect. Confluent chronic small vessel ischemia in the cerebral white matter. Chronic lacune at the left corona radiata and infra medial thalamus. Vascular: See below Skull: No acute finding.  Hyperostosis interna. Sinuses: Clear Orbits: No acute finding Review of the MIP images confirms the above findings CTA NECK FINDINGS Aortic arch: Atheromatous plaque with 2 vessel branching. Reference dedicated chest CTA performed at the same time. Right carotid system: Diffuse atheromatous wall thickening of the common carotid with calcified plaque at the bifurcation. ICA origin stenosis measures 75% at least based on sagittal reformat measurement. Left carotid system: Atheromatous wall thickening diffusely with multifocal calcified plaque. No flow limiting stenosis or ulceration. Vertebral arteries: Proximal subclavian atherosclerosis on both sides with 50% stenosis at the right subclavian origin based on sagittal reformats. Left dominant vertebral artery. Both  vertebral arteries are patent to the basilar without beading or flow reducing stenosis. Skeleton: No acute or aggressive finding Other neck: Solitary  enlarged lymph node in the low right jugular chain measuring 13 mm. Upper chest: Chest CT reported separately. Review of the MIP images confirms the above findings CTA HEAD FINDINGS Anterior circulation: The right ICA is smaller than the left in the setting of hypoplastic right A1 segment and possibly also due to under filling from the severe cervical stenosis. No branch occlusion, aneurysm, or generalized beading. There is diffuse atherosclerotic calcification with mild-to-moderate right siphon stenosis, quantification limited by vessel size and tortuosity with calcified plaque blooming. Posterior circulation: Left dominant vertebral artery. The vertebral and basilar arteries are widely patent. No PCA branch occlusion or proximal stenosis. Venous sinuses: Diffusely patent Anatomic variants: As above Review of the MIP images confirms the above findings IMPRESSION: 1. No emergent finding. 2. Severe proximal right ICA stenosis due to calcified plaque. 3. 50% stenosis at the proximal right subclavian artery. 4. Solitary enlarged right jugular chain lymph node, nonspecific in isolation. Recommend follow-up after convalescence. 5. Chronic small vessel disease and chronic lacunar infarcts. Electronically Signed   By: Monte Fantasia M.D.   On: 12-15-2019 06:30   CT Cervical Spine Wo Contrast  Result Date: 11/16/2019 CLINICAL DATA:  Facial trauma EXAM: CT HEAD WITHOUT CONTRAST CT MAXILLOFACIAL WITHOUT CONTRAST CT CERVICAL SPINE WITHOUT CONTRAST TECHNIQUE: Multidetector CT imaging of the head, cervical spine, and maxillofacial structures were performed using the standard protocol without intravenous contrast. Multiplanar CT image reconstructions of the cervical spine and maxillofacial structures were also generated. COMPARISON:  10/30/2019 head CT FINDINGS: CT HEAD  FINDINGS Brain: No evidence of acute infarction, hemorrhage, hydrocephalus, extra-axial collection or mass lesion/mass effect. Patchy small remote left inferior cerebellar infarcts. Chronic small vessel ischemia that is confluent in the white matter. Chronic lacune at the medial left thalamus and left corona radiata. Vascular: Atherosclerotic calcification Skull: Negative for fracture CT MAXILLOFACIAL FINDINGS Osseous: Negative for fracture or mandibular dislocation. Edentulous with alveolar ridge atrophy. Orbits: Bilateral cataract resection. Sinuses: Clear.  No hemosinus. Soft tissues: No discrete hematoma or opaque foreign body. CT CERVICAL SPINE FINDINGS Alignment: No traumatic malalignment Skull base and vertebrae: No acute fracture Soft tissues and spinal canal: No prevertebral fluid or swelling. No visible canal hematoma. Disc levels: Facet osteoarthritis with bulky spurring asymmetric to the left. Upper chest: Negative IMPRESSION: No evidence of intracranial injury. Negative for facial or cervical spine fracture. Electronically Signed   By: Monte Fantasia M.D.   On: 11/16/2019 04:32   US Venous Img Lower Bilateral (DVT)  Result Date: 12-15-2019 CLINICAL DATA:  Bilateral leg pain and swelling EXAM: BILATERAL LOWER EXTREMITY VENOUS DOPPLER ULTRASOUND TECHNIQUE: Gray-scale sonography with graded compression, as well as color Doppler and duplex ultrasound were performed to evaluate the lower extremity deep venous systems from the level of the common femoral vein and including the common femoral, femoral, profunda femoral, popliteal and calf veins including the posterior tibial, peroneal and gastrocnemius veins when visible. The superficial great saphenous vein was also interrogated. Spectral Doppler was utilized to evaluate flow at rest and with distal augmentation maneuvers in the common femoral, femoral and popliteal veins. COMPARISON:  None. FINDINGS: RIGHT LOWER EXTREMITY Common Femoral Vein: No  evidence of thrombus. Normal compressibility, respiratory phasicity and response to augmentation. Saphenofemoral Junction: No evidence of thrombus. Normal compressibility and flow on color Doppler imaging. Profunda Femoral Vein: No evidence of thrombus. Normal compressibility and flow on color Doppler imaging. Femoral Vein: No evidence of thrombus. Normal compressibility, respiratory phasicity and response to augmentation. Popliteal Vein: No evidence of thrombus. Normal  compressibility, respiratory phasicity and response to augmentation. Calf Veins: No evidence of thrombus. Normal compressibility and flow on color Doppler imaging. Superficial Great Saphenous Vein: No evidence of thrombus. Normal compressibility. Venous Reflux:  None. Other Findings:  None. LEFT LOWER EXTREMITY Common Femoral Vein: No evidence of thrombus. Normal compressibility, respiratory phasicity and response to augmentation. Saphenofemoral Junction: No evidence of thrombus. Normal compressibility and flow on color Doppler imaging. Profunda Femoral Vein: No evidence of thrombus. Normal compressibility and flow on color Doppler imaging. Femoral Vein: No evidence of thrombus. Normal compressibility, respiratory phasicity and response to augmentation. Popliteal Vein: No evidence of thrombus. Normal compressibility, respiratory phasicity and response to augmentation. Calf Veins: No evidence of thrombus. Normal compressibility and flow on color Doppler imaging. Superficial Great Saphenous Vein: No evidence of thrombus. Normal compressibility. Venous Reflux:  None. Other Findings:  None. IMPRESSION: No evidence of deep venous thrombosis in either lower extremity. Electronically Signed   By: Inez Catalina M.D.   On: 2019-11-19 09:46   DG Chest Portable 1 View  Result Date: 2019-11-19 CLINICAL DATA:  Intubation EXAM: PORTABLE CHEST 1 VIEW COMPARISON:  Yesterday FINDINGS: Endotracheal tube with tip between the clavicular heads and carina. Streaky  opacity at the lung bases with blunting at the right cardiophrenic sulcus. There is cephalized blood flow, airway cuffing, and vascular prominence. Cardiomegaly. Aortic valve replacement. IMPRESSION: 1. Endotracheal tube in expected position. 2. Vascular congestion/edema with atelectatic type opacity at the bases. Electronically Signed   By: Monte Fantasia M.D.   On: 2019/11/19 04:18   DG Chest Portable 1 View  Result Date: 11/16/2019 CLINICAL DATA:  Multiple recent falls with shortness of breath. EXAM: PORTABLE CHEST 1 VIEW COMPARISON:  November 16, 2019 FINDINGS: Multiple sternal wires are present. Mild areas of atelectasis and/or infiltrate are seen within the bilateral lung bases. Small bilateral pleural effusions are noted. No pneumothorax is identified. The cardiac silhouette is markedly enlarged and unchanged in size. Marked severity calcification of the aortic arch is seen. Multilevel degenerative changes are noted throughout the thoracic spine. IMPRESSION: 1. Mild bibasilar atelectasis and/or infiltrate. 2. Small bilateral pleural effusions. 3. Stable cardiomegaly. Electronically Signed   By: Virgina Norfolk M.D.   On: 11/26/2019 20:35   CT Angio Chest/Abd/Pel for Dissection W and/or Wo Contrast  Result Date: November 19, 2019 CLINICAL DATA:  Chest and abdominal pain with left-sided neck pain, unresponsive EXAM: CT ANGIOGRAPHY CHEST, ABDOMEN AND PELVIS TECHNIQUE: Non-contrast CT of the chest was initially obtained. Multidetector CT imaging through the chest, abdomen and pelvis was performed using the standard protocol during bolus administration of intravenous contrast. Multiplanar reconstructed images and MIPs were obtained and reviewed to evaluate the vascular anatomy. CONTRAST:  169m OMNIPAQUE IOHEXOL 350 MG/ML SOLN COMPARISON:  Chest x-ray from earlier in the same day. FINDINGS: CTA CHEST FINDINGS Cardiovascular: Initial noncontrast scan demonstrates significant atherosclerotic calcifications of  the thoracic aorta. Contrast was subsequently administered and no significant aneurysmal dilatation or dissection is noted. The heart is enlarged in size. Enlargement of the central pulmonary artery is seen consistent with pulmonary hypertension. No sizable pulmonary embolus is noted although timing was performed for arterial evaluation. Heavy coronary calcifications are noted. No pericardial effusion is seen. Mediastinum/Nodes: Thoracic inlet is within normal limits. No hilar or mediastinal adenopathy is noted. The esophagus as visualized is within normal limits. Endotracheal tube is noted in satisfactory position. Lungs/Pleura: Left lung is well aerated without focal infiltrate. Mild changes of central pulmonary edema are noted. Small left pleural effusion is seen.  More marked edematous changes are noted on the right with associated right lower lobe atelectasis/consolidation. Small right pleural effusion is seen. Calcified granulomas are noted in the right lower lobe. Musculoskeletal: Degenerative changes of the thoracic spine are noted. No compression deformity is seen. No acute rib abnormality is noted. Changes of prior median sternotomy are seen. Review of the MIP images confirms the above findings. CTA ABDOMEN AND PELVIS FINDINGS VASCULAR Aorta: Atherosclerotic calcifications of the abdominal aorta are noted. No aneurysmal dilatation or dissection is seen. Celiac: Patent without evidence of aneurysm, dissection, vasculitis or significant stenosis. SMA: Patent without evidence of aneurysm, dissection, vasculitis or significant stenosis. Renals: Both renal arteries are patent without evidence of aneurysm, dissection, vasculitis, fibromuscular dysplasia or significant stenosis. 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 No dissection is seen. No focal stenosis is noted. Veins: Significant reflux into the hepatic venous system is noted likely  related to increased central venous pressure Review of the MIP images confirms the above findings. NON-VASCULAR Hepatobiliary: No focal liver abnormality is seen. Status post cholecystectomy. No biliary dilatation. Pancreas: Fatty infiltrated. Spleen: Normal in size without focal abnormality. Adrenals/Urinary Tract: Adrenal glands are within normal limits. Kidneys demonstrate normal enhancement pattern. No renal calculi or obstructive changes are seen. The bladder is decompressed by Foley catheter. Stomach/Bowel: The appendix is not well visualized although no inflammatory changes are seen. A right sided anterior abdominal wall hernia is noted. A loop of colon is noted within although no incarceration is noted. Small bowel and stomach are unremarkable. Lymphatic: No sizable lymphadenopathy is noted. Reproductive: Uterus is small consistent with postmenopausal state. Calcification is noted in the right ovary although no mass lesion is seen. Other: No abdominal wall hernia or abnormality. No abdominopelvic ascites Musculoskeletal: Degenerative changes of lumbar spine are noted. No acute bony abnormality is seen. Review of the MIP images confirms the above findings. IMPRESSION: Atherosclerotic calcifications of the aorta are noted without aneurysmal dilatation or dissection. No evidence of pulmonary emboli. Bilateral pleural effusions right greater than left with associated right lower lobe consolidation/atelectasis. Changes of mild pulmonary edema right greater than left with findings of increased central venous pressure and reflux into the hepatic venous system. Right paramedian anterior abdominal wall hernia containing a loop of colon without obstructive change. Electronically Signed   By: Inez Catalina M.D.   On: 11-19-2019 06:25   CT Maxillofacial WO CM  Result Date: 11/16/2019 CLINICAL DATA:  Facial trauma EXAM: CT HEAD WITHOUT CONTRAST CT MAXILLOFACIAL WITHOUT CONTRAST CT CERVICAL SPINE WITHOUT CONTRAST  TECHNIQUE: Multidetector CT imaging of the head, cervical spine, and maxillofacial structures were performed using the standard protocol without intravenous contrast. Multiplanar CT image reconstructions of the cervical spine and maxillofacial structures were also generated. COMPARISON:  10/30/2019 head CT FINDINGS: CT HEAD FINDINGS Brain: No evidence of acute infarction, hemorrhage, hydrocephalus, extra-axial collection or mass lesion/mass effect. Patchy small remote left inferior cerebellar infarcts. Chronic small vessel ischemia that is confluent in the white matter. Chronic lacune at the medial left thalamus and left corona radiata. Vascular: Atherosclerotic calcification Skull: Negative for fracture CT MAXILLOFACIAL FINDINGS Osseous: Negative for fracture or mandibular dislocation. Edentulous with alveolar ridge atrophy. Orbits: Bilateral cataract resection. Sinuses: Clear.  No hemosinus. Soft tissues: No discrete hematoma or opaque foreign body. CT CERVICAL SPINE FINDINGS Alignment: No traumatic malalignment Skull base and vertebrae: No acute fracture Soft tissues and spinal canal: No prevertebral fluid or swelling. No visible  canal hematoma. Disc levels: Facet osteoarthritis with bulky spurring asymmetric to the left. Upper chest: Negative IMPRESSION: No evidence of intracranial injury. Negative for facial or cervical spine fracture. Electronically Signed   By: Monte Fantasia M.D.   On: 11/16/2019 04:32    Microbiology Recent Results (from the past 240 hour(s))  Blood culture (routine x 2)     Status: None (Preliminary result)   Collection Time: 11/24/2019  8:54 PM   Specimen: BLOOD RIGHT HAND  Result Value Ref Range Status   Specimen Description BLOOD RIGHT HAND  Final   Special Requests   Final    BOTTLES DRAWN AEROBIC AND ANAEROBIC Blood Culture adequate volume   Culture   Final    NO GROWTH < 12 HOURS Performed at [facility], [address], [place], [place] [address]    Report Status  PENDING  Incomplete  Blood culture (routine x 2)     Status: None (Preliminary result)   Collection Time: 12/03/2019  9:03 PM   Specimen: BLOOD RIGHT HAND  Result Value Ref Range Status   Specimen Description BLOOD RIGHT HAND  Final   Special Requests   Final    BOTTLES DRAWN AEROBIC AND ANAEROBIC Blood Culture adequate volume   Culture   Final    NO GROWTH < 12 HOURS Performed at [facility], [address] [place], [place] [address]    Report Status PENDING  Incomplete  SARS Coronavirus 2 by RT PCR (hospital order, performed in [place] hospital lab) Nasopharyngeal Nasopharyngeal Swab     Status: None   Collection Time: 11/19/2019 11:43 PM   Specimen: Nasopharyngeal Swab  Result Value Ref Range Status   SARS Coronavirus 2 NEGATIVE NEGATIVE Final    Comment: (NOTE) SARS-CoV-2 target nucleic acids are NOT DETECTED.  The SARS-CoV-2 RNA is generally detectable in upper and lower respiratory specimens during the acute phase of infection. The lowest concentration of SARS-CoV-2 viral copies this assay can detect is 250 copies / mL. A negative result does not preclude SARS-CoV-2 infection and should not be used as the sole basis for treatment or other patient management decisions.  A negative result may occur with improper specimen collection / handling, submission of specimen other than nasopharyngeal swab, presence of viral mutation(s) within the areas targeted by this assay, and inadequate number of viral copies (<250 copies / mL). A negative result must be combined with clinical observations, patient history, and epidemiological information.  Fact Sheet for Patients:   StrictlyIdeas.no  Fact Sheet for Healthcare Providers: BankingDealers.co.za  This test is not yet approved or  cleared by the [place] FDA and has been authorized for detection and/or diagnosis of SARS-CoV-2 by FDA under an Emergency Use Authorization (EUA).   This EUA will remain in effect (meaning this test can be used) for the duration of the COVID-19 declaration under Section 564(b)(1) of the Act, 21 U.S.C. section 360bbb-3(b)(1), unless the authorization is terminated or revoked sooner.  Performed at [facility], [address], [place], [place] [address]    Lab Basic Metabolic Panel: Recent Labs  Lab 11/26/2019 2103 11/22/2019 0403  NA 132* 133*  K 4.2 5.0  CL 95* 95*  CO2 23 22  GLUCOSE 138* 233*  BUN 36* 37*  CREATININE 1.38* 1.75*  CALCIUM 8.5* 8.1*   Liver Function Tests: Recent Labs  Lab 11/13/2019 2103 11/22/19 0403  AST 21 26  ALT 15 17  ALKPHOS 102 95  BILITOT 1.7* 1.4*  PROT 6.4* 6.0*  ALBUMIN 3.3* 2.9*   Recent Labs  Lab 11/05/2019 2103  LIPASE 27   No results for input(s): AMMONIA in the last 168 hours. CBC: Recent Labs  Lab 11/28/2019 2103 11-19-2019 0403  WBC 15.3* 16.5*  NEUTROABS 12.7* 11.6*  HGB 13.8 13.7  HCT 45.0 46.7*  MCV 90.7 95.7  PLT 184 172   Cardiac Enzymes: No results for input(s): CKTOTAL, CKMB, CKMBINDEX, TROPONINI in the last 168 hours. Sepsis Labs: Recent Labs  Lab 11/07/2019 2103 Nov 19, 2019 0403 19-Nov-2019 0559  PROCALCITON  --   --  0.31  WBC 15.3* 16.5*  --   LATICACIDVEN 1.9 7.3* 6.2*    Procedures/Operations  Family met with palliative care team today and after discussions decided to pursue full comfort care and allow compassionate extubation.  Please see palliative notes.  Pt expired and pronounced 1546.    Clanford Johnson MD  19-Nov-2019, 4:02 PM

## 2019-12-04 NOTE — Progress Notes (Signed)
Present with Julie Burns after vent support was removed. Her 2 daughters and Melanee Spry a grandson were also present and as we prayed for her peace and their comfort, she began to slow her breathing. Stayed with them for spiritual support.

## 2019-12-04 NOTE — Progress Notes (Signed)
RT called to room for patient in respiratory failure.  Pt was unresponsive and agonal breathing. Pt bagged with BMV and CPR started.  Once patients had pulses, pt intubated by ED MD with 7.5.  Positive CO2 color change and equal BBS.  Tube secured at 23 at lip.  Patient placed on vent on PRVC 450/22/100/5.  ABG obtained and sent to lab.  RT will continue to monitor.

## 2019-12-04 NOTE — Procedures (Signed)
Extubation Procedure Note  Patient Details:   Name: TEMPESTT SILBA DOB: [birth year] MRN: [id number]   Airway Documentation:    Vent end date: 11/09/2019 Vent end time: 1517   Evaluation  O2 sats: currently acceptable Complications: No apparent complications Patient did tolerate procedure well. Bilateral Breath Sounds: Diminished   No  Patient terminally extubated Sharene Skeans 11/10/2019, 3:18 PM

## 2019-12-04 NOTE — Progress Notes (Signed)
Pt admitted with Respiratory leading to cardiac arrest with PNA and septic shock.  She is now DNR and family has decided to transition to comfort measures after meeting with palliative care.  PCCM can be available if needed.  Coralyn Helling, MD [facility] Pulmonary/Critical Care Pager - [phone] 11/24/2019, 2:29 PM

## 2019-12-04 NOTE — ED Notes (Signed)
Date and time results received: 2019-11-22 0555  Test: pH Critical Value: 7.084  Name of Provider Notified: Adefso  Orders Received? Or Actions Taken?: na

## 2019-12-04 NOTE — Progress Notes (Signed)
ASSUMPTION OF CARE NOTE   2019-12-01 1:41 PM  Julie Burns was seen and examined.  The H&P by the admitting provider, orders, imaging was reviewed.  Please see new orders.  Will continue to follow. Palliative team met with family and now transitioning to comfort care and compassionate extubation.    Vitals:   12-01-2019 1300 December 01, 2019 1321  BP:  127/88  Pulse: 88 (!) 125  Resp: 20 20  Temp:    SpO2: 99% 99%    Results for orders placed or performed during the hospital encounter of 11/24/2019  Blood culture (routine x 2)   Specimen: BLOOD RIGHT HAND  Result Value Ref Range   Specimen Description BLOOD RIGHT HAND    Special Requests      BOTTLES DRAWN AEROBIC AND ANAEROBIC Blood Culture adequate volume   Culture      NO GROWTH < 12 HOURS Performed at [facility], [address], [place], [place] [address]    Report Status PENDING   Blood culture (routine x 2)   Specimen: BLOOD RIGHT HAND  Result Value Ref Range   Specimen Description BLOOD RIGHT HAND    Special Requests      BOTTLES DRAWN AEROBIC AND ANAEROBIC Blood Culture adequate volume   Culture      NO GROWTH < 12 HOURS Performed at [facility], [address] [place], [place] [address]    Report Status PENDING   SARS Coronavirus 2 by RT PCR (hospital order, performed in [place] hospital lab) Nasopharyngeal Nasopharyngeal Swab   Specimen: Nasopharyngeal Swab  Result Value Ref Range   SARS Coronavirus 2 NEGATIVE NEGATIVE  CBC with Differential/Platelet  Result Value Ref Range   WBC 15.3 (H) 4.0 - 10.5 K/uL   RBC 4.96 3.87 - 5.11 MIL/uL   Hemoglobin 13.8 12.0 - 15.0 g/dL   HCT 45.0 36 - 46 %   MCV 90.7 80.0 - 100.0 fL   MCH 27.8 26.0 - 34.0 pg   MCHC 30.7 30.0 - 36.0 g/dL   RDW 17.2 (H) 11.5 - 15.5 %   Platelets 184 150 - 400 K/uL   nRBC 0.0 0.0 - 0.2 %   Neutrophils Relative % 84 %   Neutro Abs 12.7 (H) 1.7 - 7.7 K/uL   Lymphocytes Relative 4 %   Lymphs Abs 0.6 (L) 0.7 - 4.0 K/uL   Monocytes Relative 11 %    Monocytes Absolute 1.7 (H) 0 - 1 K/uL   Eosinophils Relative 0 %   Eosinophils Absolute 0.0 0 - 0 K/uL   Basophils Relative 0 %   Basophils Absolute 0.1 0 - 0 K/uL   Immature Granulocytes 1 %   Abs Immature Granulocytes 0.09 (H) 0.00 - 0.07 K/uL  Lactic acid, plasma  Result Value Ref Range   Lactic Acid, Venous 1.9 0.5 - 1.9 mmol/L  Comprehensive metabolic panel  Result Value Ref Range   Sodium 132 (L) 135 - 145 mmol/L   Potassium 4.2 3.5 - 5.1 mmol/L   Chloride 95 (L) 98 - 111 mmol/L   CO2 23 22 - 32 mmol/L   Glucose, Bld 138 (H) 70 - 99 mg/dL   BUN 36 (H) 8 - 23 mg/dL   Creatinine, Ser 1.38 (H) 0.44 - 1.00 mg/dL   Calcium 8.5 (L) 8.9 - 10.3 mg/dL   Total Protein 6.4 (L) 6.5 - 8.1 g/dL   Albumin 3.3 (L) 3.5 - 5.0 g/dL   AST 21 15 - 41 U/L   ALT 15  0 - 44 U/L   Alkaline Phosphatase 102 38 - 126 U/L   Total Bilirubin 1.7 (H) 0.3 - 1.2 mg/dL   GFR calc non Af Amer 34 (L) >60 mL/min   GFR calc Af Amer 40 (L) >60 mL/min   Anion gap 14 5 - 15  Lipase, blood  Result Value Ref Range   Lipase 27 11 - 51 U/L  Urinalysis, Routine w reflex microscopic Urine, Catheterized  Result Value Ref Range   Color, Urine AMBER (A) YELLOW   APPearance CLOUDY (A) CLEAR   Specific Gravity, Urine 1.020 1.005 - 1.030   pH 5.0 5.0 - 8.0   Glucose, UA NEGATIVE NEGATIVE mg/dL   Hgb urine dipstick NEGATIVE NEGATIVE   Bilirubin Urine SMALL (A) NEGATIVE   Ketones, ur 5 (A) NEGATIVE mg/dL   Protein, ur 100 (A) NEGATIVE mg/dL   Nitrite NEGATIVE NEGATIVE   Leukocytes,Ua SMALL (A) NEGATIVE   RBC / HPF 0-5 0 - 5 RBC/hpf   WBC, UA 21-50 0 - 5 WBC/hpf   Bacteria, UA RARE (A) NONE SEEN   Squamous Epithelial / LPF 6-10 0 - 5   WBC Clumps PRESENT    Hyaline Casts, UA PRESENT    Non Squamous Epithelial 6-10 (A) NONE SEEN  Blood gas, venous (at [facility], not at [facility])  Result Value Ref Range   FIO2 21.00    pH, Ven 7.419 7.25 - 7.43   pCO2, Ven 37.7 (L) 44 - 60 mmHg   pO2, Ven 130.0 (H) 32 - 45 mmHg    Bicarbonate 24.6 20.0 - 28.0 mmol/L   Acid-base deficit 0.0 0.0 - 2.0 mmol/L   O2 Saturation 98.6 %   Patient temperature 37.0   Blood gas, arterial  Result Value Ref Range   FIO2 100.00    Delivery systems PRESSURE REGULATED VOLUME CONTROL    VT 450 mL   pH, Arterial 7.082 (LL) 7.35 - 7.45   pCO2 arterial 65.2 (HH) 32 - 48 mmHg   pO2, Arterial 121 (H) 83 - 108 mmHg   Bicarbonate 14.8 (L) 20.0 - 28.0 mmol/L   Acid-base deficit 9.9 (H) 0.0 - 2.0 mmol/L   O2 Saturation 95.7 %   Patient temperature 37.0    Collection site RIGHT RADIAL    Drawn by [id number]    Allens test (pass/fail) PASS PASS  Blood gas, arterial  Result Value Ref Range   FIO2 100.00    pH, Arterial 7.084 (LL) 7.35 - 7.45   pCO2 arterial 64.1 (H) 32 - 48 mmHg   pO2, Arterial 159 (H) 83 - 108 mmHg   Bicarbonate 14.9 (L) 20.0 - 28.0 mmol/L   Acid-base deficit 10.1 (H) 0.0 - 2.0 mmol/L   O2 Saturation 97.4 %   Patient temperature 37.0    Allens test (pass/fail) BRACHIAL ARTERY (A) PASS  CBC with Differential  Result Value Ref Range   WBC 16.5 (H) 4.0 - 10.5 K/uL   RBC 4.88 3.87 - 5.11 MIL/uL   Hemoglobin 13.7 12.0 - 15.0 g/dL   HCT 46.7 (H) 36 - 46 %   MCV 95.7 80.0 - 100.0 fL   MCH 28.1 26.0 - 34.0 pg   MCHC 29.3 (L) 30.0 - 36.0 g/dL   RDW 17.5 (H) 11.5 - 15.5 %   Platelets 172 150 - 400 K/uL   nRBC 0.2 0.0 - 0.2 %   Neutrophils Relative % 71 %   Neutro Abs 11.6 (H) 1.7 - 7.7 K/uL   Lymphocytes  Relative 20 %   Lymphs Abs 3.4 0.7 - 4.0 K/uL   Monocytes Relative 7 %   Monocytes Absolute 1.1 (H) 0 - 1 K/uL   Eosinophils Relative 0 %   Eosinophils Absolute 0.0 0 - 0 K/uL   Basophils Relative 0 %   Basophils Absolute 0.1 0 - 0 K/uL   Immature Granulocytes 2 %   Abs Immature Granulocytes 0.33 (H) 0.00 - 0.07 K/uL  Comprehensive metabolic panel  Result Value Ref Range   Sodium 133 (L) 135 - 145 mmol/L   Potassium 5.0 3.5 - 5.1 mmol/L   Chloride 95 (L) 98 - 111 mmol/L   CO2 22 22 - 32 mmol/L   Glucose, Bld  233 (H) 70 - 99 mg/dL   BUN 37 (H) 8 - 23 mg/dL   Creatinine, Ser 1.75 (H) 0.44 - 1.00 mg/dL   Calcium 8.1 (L) 8.9 - 10.3 mg/dL   Total Protein 6.0 (L) 6.5 - 8.1 g/dL   Albumin 2.9 (L) 3.5 - 5.0 g/dL   AST 26 15 - 41 U/L   ALT 17 0 - 44 U/L   Alkaline Phosphatase 95 38 - 126 U/L   Total Bilirubin 1.4 (H) 0.3 - 1.2 mg/dL   GFR calc non Af Amer 26 (L) >60 mL/min   GFR calc Af Amer 30 (L) >60 mL/min   Anion gap 16 (H) 5 - 15  Lactic acid, plasma  Result Value Ref Range   Lactic Acid, Venous 7.3 (HH) 0.5 - 1.9 mmol/L  Lactic acid, plasma  Result Value Ref Range   Lactic Acid, Venous 6.2 (HH) 0.5 - 1.9 mmol/L  Triglycerides  Result Value Ref Range   Triglycerides 92 <150 mg/dL  Procalcitonin - Baseline  Result Value Ref Range   Procalcitonin 0.31 ng/mL  POC CBG, ED  Result Value Ref Range   Glucose-Capillary 138 (H) 70 - 99 mg/dL  Troponin I (High Sensitivity)  Result Value Ref Range   Troponin I (High Sensitivity) 63 (H) <18 ng/L  Troponin I (High Sensitivity)  Result Value Ref Range   Troponin I (High Sensitivity) 81 (H) <18 ng/L     C. Wynetta Emery, MD [facility]   11/25/2019  7:03 PM How to contact the [facility] Attending or Consulting provider [place] or covering provider during after hours [place], for this patient?  1. Check the care team in [facility] and look for a) attending/consulting [facility] provider listed and b) the [facility] team listed 2. Log into [email or web address] and use [place]'s universal password to access. If you do not have the password, please contact the hospital operator. 3. Locate the [facility] provider you are looking for under [facility] and page to a number that you can be directly reached. 4. If you still have difficulty reaching the provider, please page the [facility] (Director on Call) for the Hospitalists listed on amion for assistance.

## 2019-12-04 NOTE — ED Notes (Signed)
Julie Burns, [facility], contacted and made referral. Referral number is [id number].

## 2019-12-04 NOTE — ED Notes (Signed)
RT at bedside to extubate pt per orders.

## 2019-12-04 NOTE — ED Notes (Signed)
Dilaudid drip bag wasted with Levy Sjogren, RN. Approximately 22ml of fluid was wasted.

## 2019-12-04 NOTE — ED Notes (Signed)
Pt family called to check on pt condition. During phone call family expressed that if pt's heart was to stop beating, not to initiate CPR. However keep the breathing tube for now. Verification with Marcha Solders RN.. EDP notified and orders placed.

## 2019-12-04 NOTE — ED Notes (Signed)
Dilaudid drip bag wasted with Fransico Him, RN. Approximately 48 mLs of fluid was wasted.

## 2019-12-04 NOTE — Progress Notes (Signed)
Pt transported to and from CT on vent with no issues.  RT will continue to monitor.

## 2019-12-04 DEATH — deceased

## 2019-12-07 ENCOUNTER — Ambulatory Visit: Payer: Medicare Other | Admitting: Gastroenterology

## 2019-12-17 ENCOUNTER — Ambulatory Visit: Payer: Medicare Other

## 2020-03-23 ENCOUNTER — Encounter (INDEPENDENT_AMBULATORY_CARE_PROVIDER_SITE_OTHER): Payer: Medicare Other | Admitting: Ophthalmology

## 2021-12-03 IMAGING — DX DG CHEST 1V PORT
1 series · 1 of 1 positions shown · non-contrast
Comparison: 06/03/2019.  05/18/2017.

CLINICAL DATA: Shortness of breath and difficulty swallowing.

EXAM:
PORTABLE CHEST 1 VIEW

[chest ap]
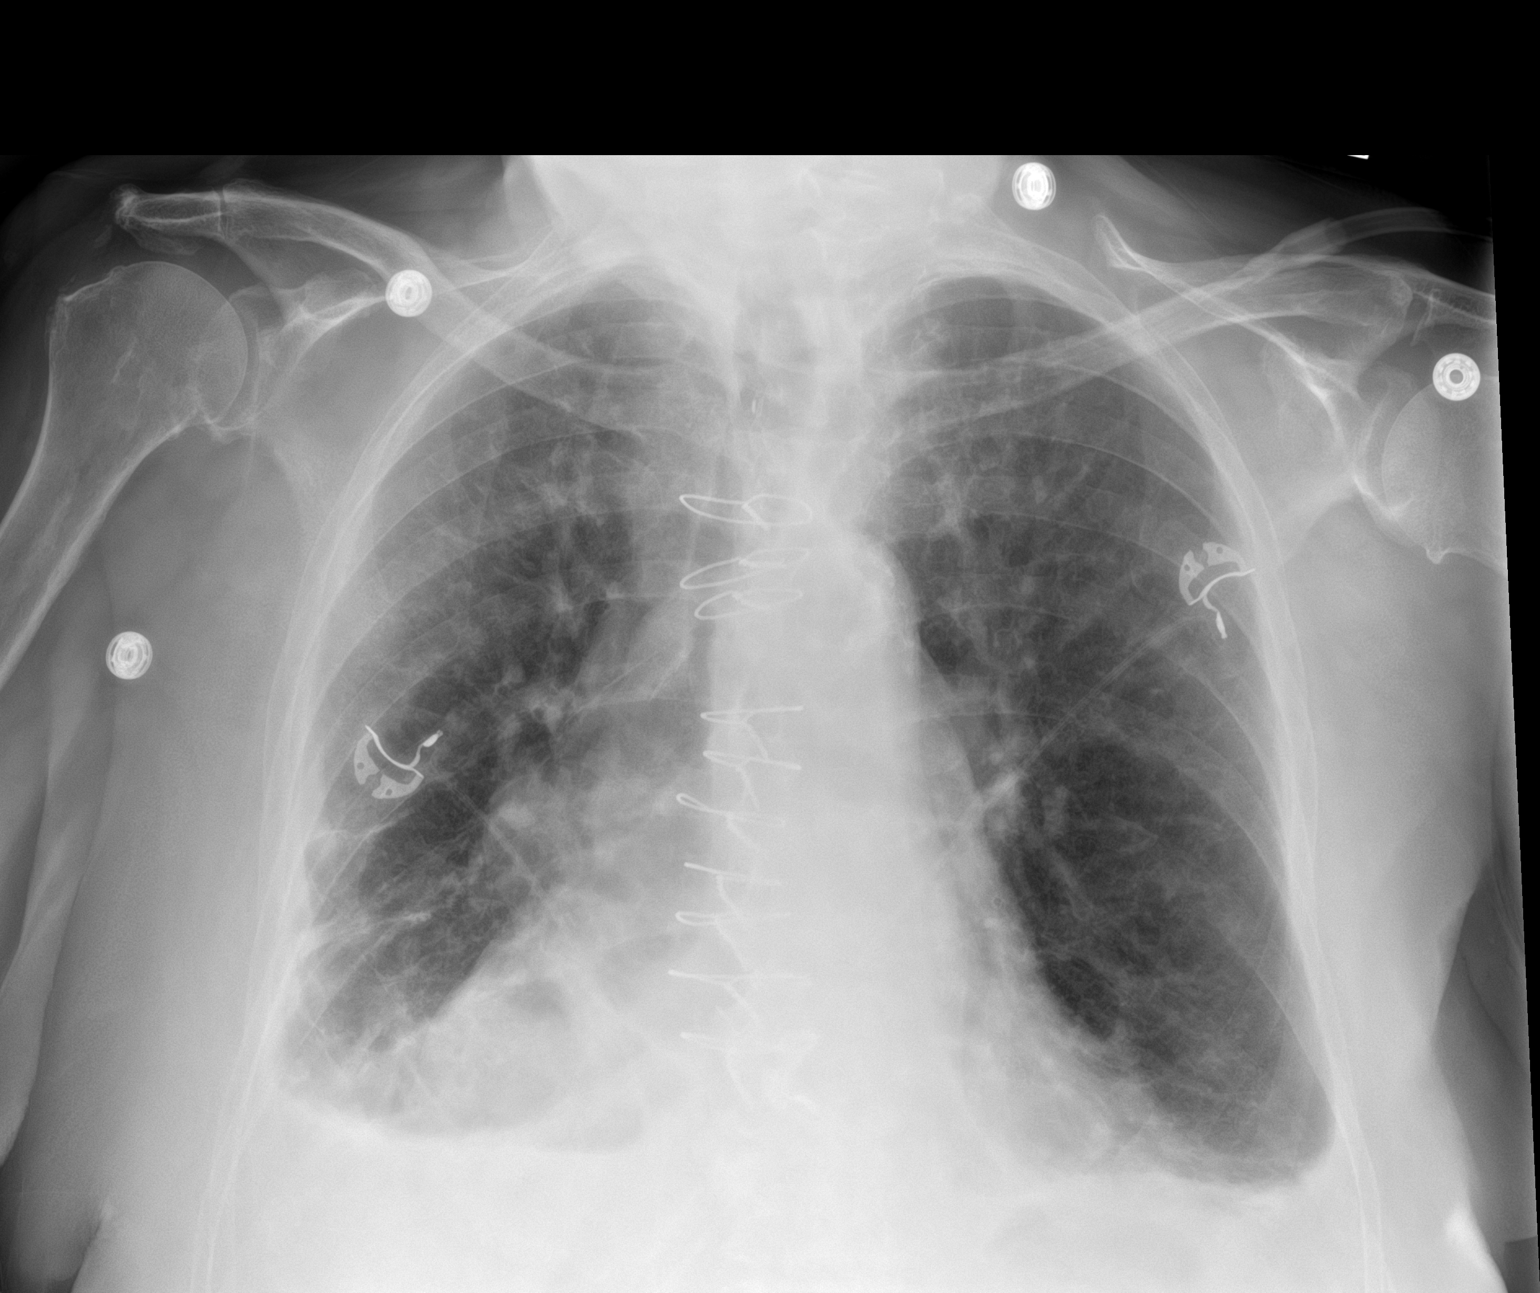

[1 of 1 positions shown; findings below may reference images not displayed]

FINDINGS: Previous median sternotomy and CABG. Chronic cardiomegaly. Chronic
pleural scarring. Cannot rule out the presence of some pleural
fluid. Venous hypertension but without frank edema.
IMPRESSION: Previous CABG.

Chronic lung disease. Chronic pleural blunting consistent with
pleural scarring seen on multiple previous studies. Cannot rule out
the presence of some pleural fluid.

Pulmonary venous hypertension but without frank edema.

## 2022-05-13 IMAGING — US US EXTREM LOW VENOUS
1 series · 13 of 24 positions shown · non-contrast
Comparison: None.

CLINICAL DATA: Bilateral leg pain and swelling



[Series 1: us venous img lower bilat (dvt) · portal-venous · 13 of 59 slices shown]
[im 1/59]
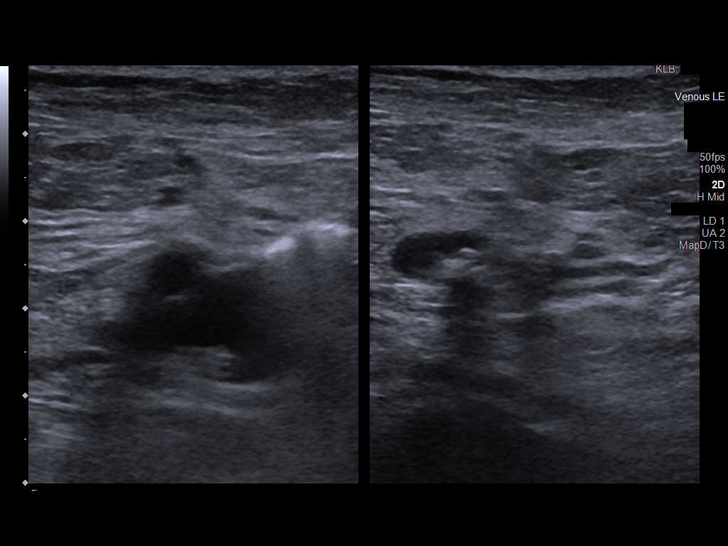
[im 6/59]
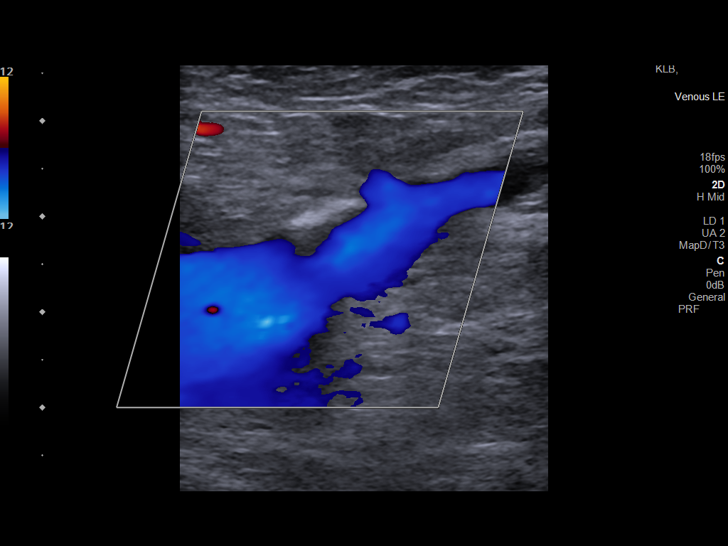
[im 11/59]
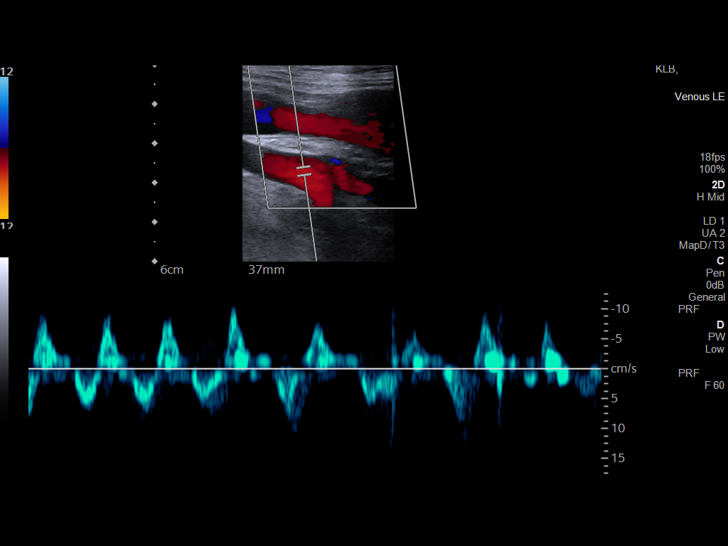
[im 16/59]
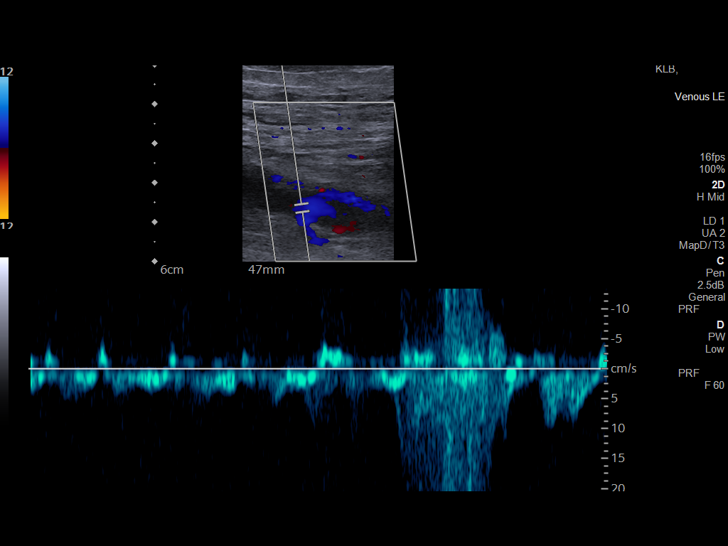
[im 21/59]
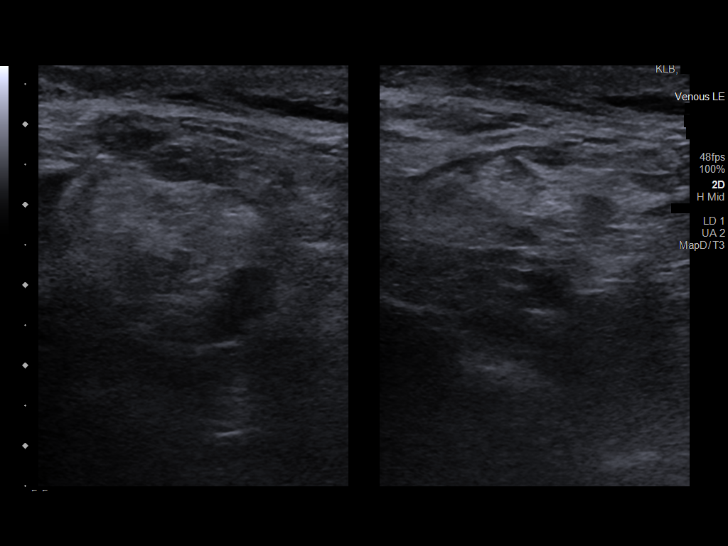
[im 26/59]
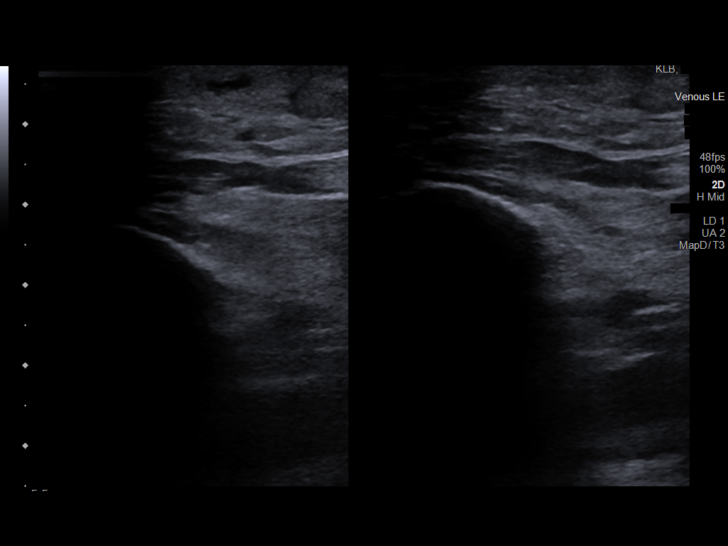
[im 31/59]
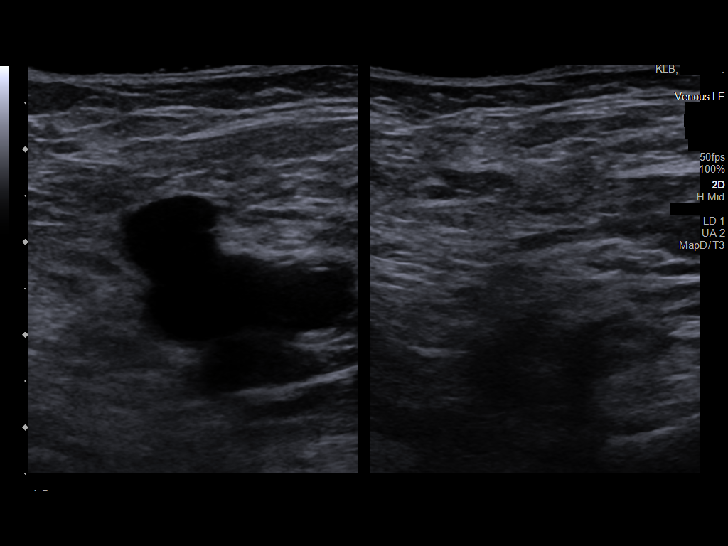
[im 33/59]
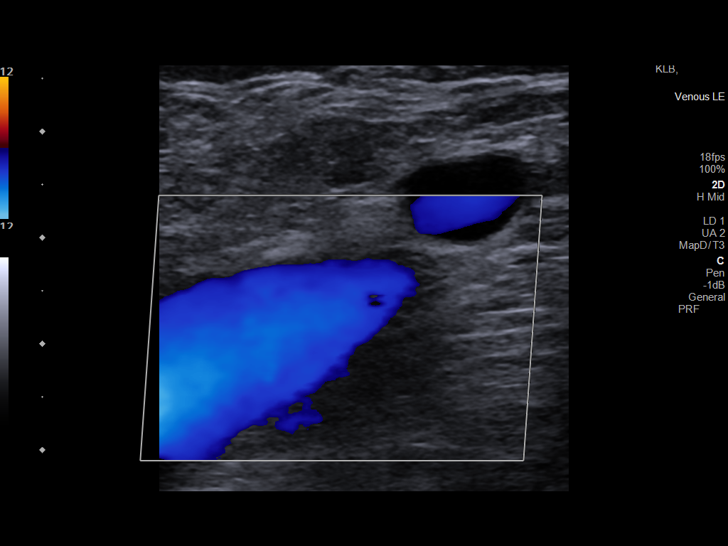
[im 38/59]
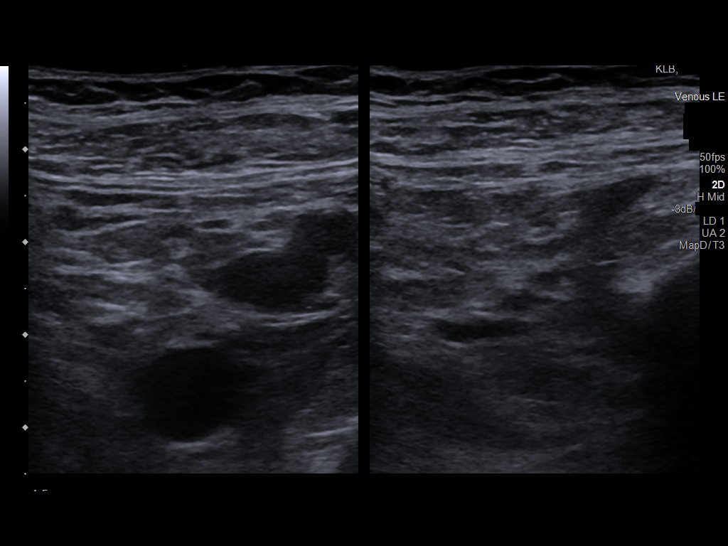
[im 43/59]
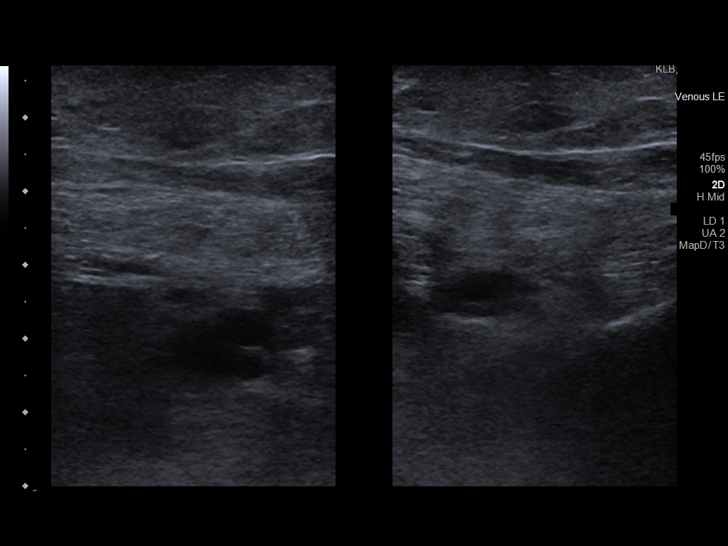
[im 48/59]
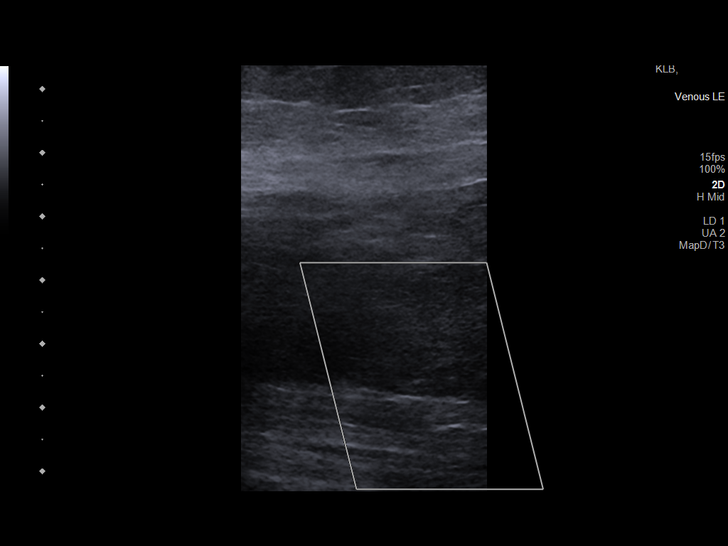
[im 53/59]
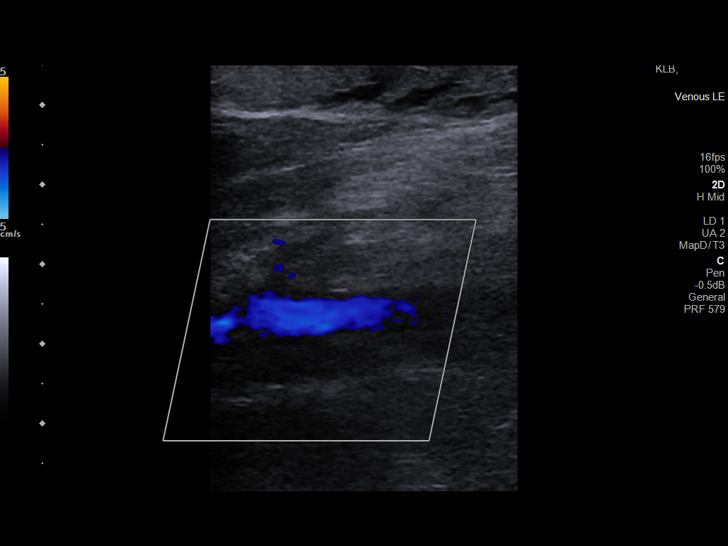
[im 59/59]
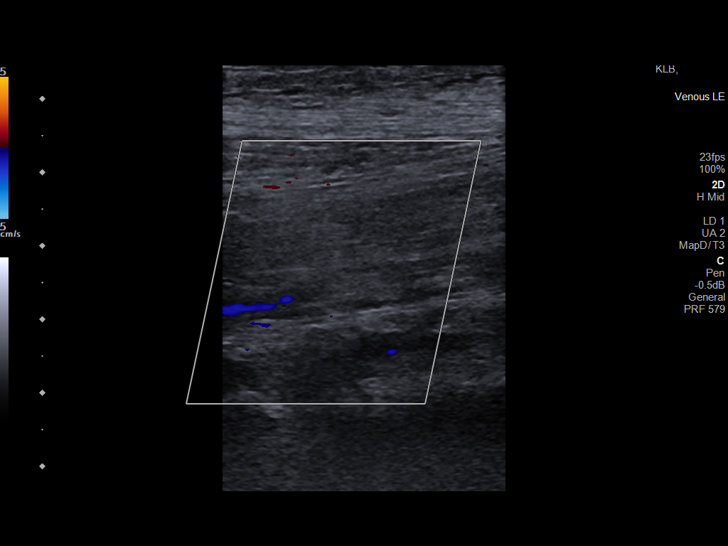

[13 of 24 positions shown; findings below may reference images not displayed]

FINDINGS: RIGHT LOWER EXTREMITY

Common Femoral Vein: No evidence of thrombus. Normal
compressibility, respiratory phasicity and response to augmentation.

Saphenofemoral Junction: No evidence of thrombus. Normal
compressibility and flow on color Doppler imaging.

Profunda Femoral Vein: No evidence of thrombus. Normal
compressibility and flow on color Doppler imaging.

Femoral Vein: No evidence of thrombus. Normal compressibility,
respiratory phasicity and response to augmentation.

Popliteal Vein: No evidence of thrombus. Normal compressibility,
respiratory phasicity and response to augmentation.

Calf Veins: No evidence of thrombus. Normal compressibility and flow
on color Doppler imaging.

Superficial Great Saphenous Vein: No evidence of thrombus. Normal
compressibility.

Venous Reflux:  None.

Other Findings:  None.

LEFT LOWER EXTREMITY

Common Femoral Vein: No evidence of thrombus. Normal
compressibility, respiratory phasicity and response to augmentation.

Saphenofemoral Junction: No evidence of thrombus. Normal
compressibility and flow on color Doppler imaging.

Profunda Femoral Vein: No evidence of thrombus. Normal
compressibility and flow on color Doppler imaging.

Femoral Vein: No evidence of thrombus. Normal compressibility,
respiratory phasicity and response to augmentation.

Popliteal Vein: No evidence of thrombus. Normal compressibility,
respiratory phasicity and response to augmentation.

Calf Veins: No evidence of thrombus. Normal compressibility and flow
on color Doppler imaging.

Superficial Great Saphenous Vein: No evidence of thrombus. Normal
compressibility.

Venous Reflux:  None.

Other Findings:  None.
IMPRESSION: No evidence of deep venous thrombosis in either lower extremity.
